# Patient Record
Sex: Female | Born: 1985 | Race: White | Hispanic: No | Marital: Married | Smoking: Never smoker
Health system: Southern US, Community
[De-identification: ages and names within clinical notes are randomized; demographics above are authoritative.]

## PROBLEM LIST (undated history)

## (undated) ENCOUNTER — Inpatient Hospital Stay (HOSPITAL_COMMUNITY): Payer: Self-pay

## (undated) DIAGNOSIS — Z973 Presence of spectacles and contact lenses: Secondary | ICD-10-CM

## (undated) DIAGNOSIS — E039 Hypothyroidism, unspecified: Secondary | ICD-10-CM

## (undated) DIAGNOSIS — M1990 Unspecified osteoarthritis, unspecified site: Secondary | ICD-10-CM

## (undated) DIAGNOSIS — B019 Varicella without complication: Secondary | ICD-10-CM

## (undated) DIAGNOSIS — Z8639 Personal history of other endocrine, nutritional and metabolic disease: Secondary | ICD-10-CM

## (undated) DIAGNOSIS — J189 Pneumonia, unspecified organism: Secondary | ICD-10-CM

## (undated) DIAGNOSIS — F319 Bipolar disorder, unspecified: Secondary | ICD-10-CM

## (undated) DIAGNOSIS — F419 Anxiety disorder, unspecified: Secondary | ICD-10-CM

## (undated) DIAGNOSIS — F329 Major depressive disorder, single episode, unspecified: Secondary | ICD-10-CM

## (undated) HISTORY — DX: Bipolar disorder, unspecified: F31.9

## (undated) HISTORY — DX: Anxiety disorder, unspecified: F41.9

## (undated) HISTORY — PX: TONSILLECTOMY: SUR1361

## (undated) HISTORY — PX: NO PAST SURGERIES: SHX2092

---
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
## 2003-05-04 ENCOUNTER — Other Ambulatory Visit: Admission: RE | Admit: 2003-05-04 | Discharge: 2003-05-04 | Payer: Self-pay | Admitting: Gynecology

## 2004-05-21 ENCOUNTER — Other Ambulatory Visit: Admission: RE | Admit: 2004-05-21 | Discharge: 2004-05-21 | Payer: Self-pay | Admitting: Obstetrics and Gynecology

## 2004-08-21 ENCOUNTER — Inpatient Hospital Stay (HOSPITAL_COMMUNITY): Admission: AD | Admit: 2004-08-21 | Discharge: 2004-08-21 | Payer: Self-pay | Admitting: Obstetrics and Gynecology

## 2004-08-22 ENCOUNTER — Inpatient Hospital Stay (HOSPITAL_COMMUNITY): Admission: AD | Admit: 2004-08-22 | Discharge: 2004-08-22 | Payer: Self-pay | Admitting: Obstetrics and Gynecology

## 2004-08-23 ENCOUNTER — Inpatient Hospital Stay (HOSPITAL_COMMUNITY): Admission: AD | Admit: 2004-08-23 | Discharge: 2004-08-23 | Payer: Self-pay | Admitting: Obstetrics and Gynecology

## 2004-08-24 ENCOUNTER — Inpatient Hospital Stay (HOSPITAL_COMMUNITY): Admission: AD | Admit: 2004-08-24 | Discharge: 2004-08-24 | Payer: Self-pay | Admitting: Obstetrics and Gynecology

## 2004-09-10 ENCOUNTER — Inpatient Hospital Stay (HOSPITAL_COMMUNITY): Admission: AD | Admit: 2004-09-10 | Discharge: 2004-09-10 | Payer: Self-pay | Admitting: Obstetrics and Gynecology

## 2004-09-21 ENCOUNTER — Inpatient Hospital Stay (HOSPITAL_COMMUNITY): Admission: AD | Admit: 2004-09-21 | Discharge: 2004-09-23 | Payer: Self-pay | Admitting: Obstetrics and Gynecology

## 2004-09-21 ENCOUNTER — Encounter (INDEPENDENT_AMBULATORY_CARE_PROVIDER_SITE_OTHER): Payer: Self-pay | Admitting: *Deleted

## 2005-05-07 ENCOUNTER — Other Ambulatory Visit: Admission: RE | Admit: 2005-05-07 | Discharge: 2005-05-07 | Payer: Self-pay | Admitting: Gynecology

## 2006-05-07 ENCOUNTER — Other Ambulatory Visit: Admission: RE | Admit: 2006-05-07 | Discharge: 2006-05-07 | Payer: Self-pay | Admitting: Gynecology

## 2008-03-01 ENCOUNTER — Emergency Department (HOSPITAL_COMMUNITY): Admission: EM | Admit: 2008-03-01 | Discharge: 2008-03-01 | Payer: Self-pay | Admitting: Emergency Medicine

## 2009-06-24 ENCOUNTER — Emergency Department (HOSPITAL_COMMUNITY): Admission: EM | Admit: 2009-06-24 | Discharge: 2009-06-24 | Payer: Self-pay | Admitting: Emergency Medicine

## 2010-02-22 ENCOUNTER — Emergency Department (HOSPITAL_COMMUNITY)
Admission: EM | Admit: 2010-02-22 | Discharge: 2010-02-22 | Payer: Self-pay | Source: Home / Self Care | Admitting: Emergency Medicine

## 2010-04-19 ENCOUNTER — Emergency Department (HOSPITAL_COMMUNITY)
Admission: EM | Admit: 2010-04-19 | Discharge: 2010-04-19 | Payer: Self-pay | Source: Home / Self Care | Admitting: Emergency Medicine

## 2010-06-09 ENCOUNTER — Emergency Department (HOSPITAL_COMMUNITY)
Admission: EM | Admit: 2010-06-09 | Discharge: 2010-06-09 | Payer: Self-pay | Source: Home / Self Care | Admitting: Emergency Medicine

## 2010-07-01 NOTE — L&D Delivery Note (Signed)
Delivery Note At 3:31 AM a viable female was delivered via Vaginal, Spontaneous Delivery (Presentation: Left Occiput Anterior).  APGAR: 8, 9; weight 7 lb 9 oz (3430 g).   Placenta status: Intact, Spontaneous.  Cord: 3 vessels with the following complications: Tight nuchal cord x 2 delivered through.  Anesthesia: Epidural  Episiotomy: None Lacerations: None Suture Repair: N/A Est. Blood Loss (mL): 300cc  Mom to postpartum.  Baby to nursery-stable.  Oliver Pila 04/05/2011, 3:52 AM

## 2010-08-02 DIAGNOSIS — E039 Hypothyroidism, unspecified: Secondary | ICD-10-CM | POA: Insufficient documentation

## 2010-08-30 LAB — RUBELLA ANTIBODY, IGM: Rubella: IMMUNE

## 2010-08-30 LAB — GC/CHLAMYDIA PROBE AMP, GENITAL
Chlamydia: NEGATIVE
Gonorrhea: NEGATIVE

## 2010-08-30 LAB — ABO/RH: RH Type: POSITIVE

## 2010-08-30 LAB — HEPATITIS B SURFACE ANTIGEN: Hepatitis B Surface Ag: NEGATIVE

## 2010-08-30 LAB — RPR: RPR: NONREACTIVE

## 2010-08-30 LAB — ANTIBODY SCREEN: Antibody Screen: NEGATIVE

## 2010-08-30 LAB — HIV ANTIBODY (ROUTINE TESTING W REFLEX): HIV: NONREACTIVE

## 2010-10-01 LAB — URINALYSIS, ROUTINE W REFLEX MICROSCOPIC
Bilirubin Urine: NEGATIVE
Glucose, UA: NEGATIVE mg/dL
Ketones, ur: NEGATIVE mg/dL
Nitrite: POSITIVE — AB
Protein, ur: NEGATIVE mg/dL
Specific Gravity, Urine: 1.019 (ref 1.005–1.030)
Urobilinogen, UA: 1 mg/dL (ref 0.0–1.0)
pH: 8 (ref 5.0–8.0)

## 2010-10-01 LAB — URINE MICROSCOPIC-ADD ON

## 2010-10-01 LAB — POCT PREGNANCY, URINE: Preg Test, Ur: NEGATIVE

## 2010-11-16 NOTE — Discharge Summary (Signed)
Rebecca Holt, Rebecca Holt NO.:  [id number]   MEDICAL RECORD NO.:  [id number]          PATIENT TYPE:  INP   LOCATION:  9101                          FACILITY:  [facility]   PHYSICIAN:  Malachi Pro. Ambrose Mantle, M.D. DATE OF BIRTH:  [birth year]   DATE OF ADMISSION:  09/21/2004  DATE OF DISCHARGE:  09/23/2004                                 DISCHARGE SUMMARY   HISTORY OF PRESENT ILLNESS:  A 25 year old white female, para 0, gravida 1,  at [redacted] weeks gestation, [facility] October 26, 2004, by a 13-week ultrasound presented  to the labor and delivery in active labor with cervical change, 4+ cm, 80%,  bulging bag of water.  Prenatal care complicated by psychiatric history of  bipolar/depressive disorder with some first trimester exposure to Depakote  before it was discontinued.  Also she had preterm labor beginning at 29  weeks with multiple maternity admission unit visits and treatment with bed  rest, terbutaline, on a p.r.n. basis.  She did receive betamethasone at that  time.   PRENATAL LABORATORY DATA:  O positive, negative antibody, rubella immune,  hepatitis B surface antigen negative, HIV declined, GC and Chlamydia  negative, RPR nonreactive, Group B Strep not done at the time of admission.  1-hour Glucola 104.   PAST OBSTETRICAL HISTORY:  Negative.   GYN HISTORY:  Negative.   PAST SURGICAL HISTORY:  Negative.   PAST MEDICAL HISTORY:  History of bipolar/depressive disorder off  medications for pregnancy.   ALLERGIES:  No known drug allergies.   MEDICATIONS:  None other than prenatal vitamins.   PHYSICAL EXAMINATION:  VITAL SIGNS:  Normal.  HEART:  LUNGS:  Normal.  ABDOMEN:  Soft, gravid, and nontender.  PELVIC:  Cervix was 5+ cm with Dr. Senaida Ores examined her, 80%.   HOSPITAL COURSE:  The patient was placed on penicillin for unknown Group B  Strep status.  By 1:36 p.m. the Pitocin was at 6 milliunits per minute,  contractions every 2 to 3 minutes.  The patient had an epidural  and was  comfortable.  The cervix was 6 cm.  Artificial rupture of membranes produced  clear fluid.  She progressed to full dilatation and pushed well.  She  delivered spontaneously OA over a second degree midline laceration by Dr.  Ambrose Mantle, a living female infant 6 pounds 2 ounces, Apgars of 9 at one minute  and 9 at five minutes.  The placenta was intact, uterus normal, rectal  negative.  Second degree midline laceration repaired with 3-0 Vicryl.  A 2-0  Vicryl suture was used for left hymenal tear.  Estimated blood loss about  400 mL.  Postpartum, the patient did quite well.  The baby stayed in the  fullterm nursery.  There was some question about whether the baby's tongue  was attached in a tongue-tied fashion.  Initial hemoglobin 11.4,  hematocrit 34.2, white count 15,700, platelet count 343,000. Follow-up  hemoglobin 9.6, hematocrit 28.0, platelet count 282,000. RPR was  nonreactive.  Group B Strep is pending at the present time.   DISCHARGE DIAGNOSES:  1.  Intrauterine pregnancy at 35 weeks.  2.  Delivered OA.   PROCEDURE:  Spontaneous delivery OA. Repair of second degree midline and  left vaginal tear.   CONDITION ON DISCHARGE:  Improved.   DISCHARGE INSTRUCTIONS:  Includes our regular discharge instruction booklet,  the patient declines analgesics at discharge and is advised to return to the  office in 6 weeks for follow-up examination.  She is also advised to take  iron pills because of her slightly low hemoglobin.      TFH/MEDQ  D:  09/23/2004  T:  09/24/2004  Job:  [id number]

## 2010-12-30 ENCOUNTER — Inpatient Hospital Stay (HOSPITAL_COMMUNITY)
Admission: AD | Admit: 2010-12-30 | Discharge: 2010-12-30 | Disposition: A | Discharge status: Home / Self Care | Payer: Medicaid Other | Source: Ambulatory Visit | Attending: Obstetrics and Gynecology | Admitting: Obstetrics and Gynecology

## 2010-12-30 DIAGNOSIS — O4700 False labor before 37 completed weeks of gestation, unspecified trimester: Secondary | ICD-10-CM | POA: Insufficient documentation

## 2010-12-30 LAB — URINALYSIS, ROUTINE W REFLEX MICROSCOPIC
Bilirubin Urine: NEGATIVE
Glucose, UA: NEGATIVE mg/dL
Hgb urine dipstick: NEGATIVE
Ketones, ur: NEGATIVE mg/dL
Leukocytes, UA: NEGATIVE
Nitrite: NEGATIVE
Protein, ur: NEGATIVE mg/dL
Specific Gravity, Urine: <1.005 — ABNORMAL LOW (ref 1.005–1.030)
Urobilinogen, UA: 0.2 mg/dL (ref 0.0–1.0)
pH: 6 (ref 5.0–8.0)

## 2010-12-30 LAB — FETAL FIBRONECTIN: Fetal Fibronectin: NEGATIVE

## 2011-01-24 ENCOUNTER — Inpatient Hospital Stay (HOSPITAL_COMMUNITY)
Admission: AD | Admit: 2011-01-24 | Discharge: 2011-01-24 | Disposition: A | Discharge status: Home / Self Care | Payer: Medicaid Other | Source: Ambulatory Visit | Attending: Obstetrics and Gynecology | Admitting: Obstetrics and Gynecology

## 2011-01-24 ENCOUNTER — Encounter (HOSPITAL_COMMUNITY): Payer: Self-pay

## 2011-01-24 DIAGNOSIS — R109 Unspecified abdominal pain: Secondary | ICD-10-CM | POA: Insufficient documentation

## 2011-01-24 DIAGNOSIS — O212 Late vomiting of pregnancy: Secondary | ICD-10-CM | POA: Insufficient documentation

## 2011-01-24 DIAGNOSIS — O479 False labor, unspecified: Secondary | ICD-10-CM

## 2011-01-24 DIAGNOSIS — O4700 False labor before 37 completed weeks of gestation, unspecified trimester: Secondary | ICD-10-CM | POA: Insufficient documentation

## 2011-01-24 DIAGNOSIS — O9989 Other specified diseases and conditions complicating pregnancy, childbirth and the puerperium: Secondary | ICD-10-CM | POA: Insufficient documentation

## 2011-01-24 HISTORY — DX: Hypothyroidism, unspecified: E03.9

## 2011-01-24 MED ORDER — NIFEDIPINE 10 MG PO CAPS
20.0000 mg | ORAL_CAPSULE | Freq: Once | ORAL | Status: AC
  Administered 2011-01-24: 20 mg via ORAL
  Filled 2011-01-24: qty 2

## 2011-01-24 MED ORDER — NIFEDIPINE 10 MG PO CAPS: 10.0000 mg | ORAL_CAPSULE | Freq: Four times a day (QID) | ORAL | Status: DC

## 2011-01-24 MED ORDER — ONDANSETRON HCL 4 MG/2ML IJ SOLN
4.0000 mg | Freq: Once | INTRAMUSCULAR | Status: AC
  Administered 2011-01-24: 4 mg via INTRAVENOUS
  Filled 2011-01-24: qty 2

## 2011-01-24 MED ORDER — LACTATED RINGERS IV SOLN
INTRAVENOUS | Status: DC
  Administered 2011-01-24: 17:00:00 via INTRAVENOUS

## 2011-01-24 MED ORDER — LACTATED RINGERS IV SOLN
Freq: Once | INTRAVENOUS | Status: AC
  Administered 2011-01-24: 1000 mL via INTRAVENOUS

## 2011-01-24 NOTE — Progress Notes (Signed)
Pt states she started having contractions at 1150, now every 5-6 minutes. Pt has had nausea and diarrhea today. Feels sore all over. Was seen in the office and sent to MAU for monitoring and IVF.

## 2011-01-24 NOTE — Progress Notes (Signed)
Pt sent over from MD's office with orders.Pt examined in office today cervix closed

## 2011-01-24 NOTE — ED Provider Notes (Signed)
History    Patient is 25 year old white female is a gravida 2 para 1 at 29.3 weeks. She was seen earlier today in the office and found to be contracting. Dr. Ellyn Hack did check her cervix in the office and she was found to be closed. She denies vaginal discharge or bleeding. She reports good fetal movement.  Chief Complaint  Patient presents with  . Contractions   HPI  OB History    Grav Para Term Preterm Abortions TAB SAB Ect Mult Living   2 1 0 1 0 0 0 0 0 1       Past Medical History  Diagnosis Date  . Hypothyroidism     Past Surgical History  Procedure Date  . No past surgeries     Family History  Problem Relation Age of Onset  . Diabetic kidney disease Father   . Hyperlipidemia Father     History  Substance Use Topics  . Smoking status: Former Games developer  . Smokeless tobacco: Not on file  . Alcohol Use:     Allergies: No Known Allergies  Prescriptions prior to admission  Medication Sig Dispense Refill  . levothyroxine (SYNTHROID, LEVOTHROID) 50 MCG tablet Take 50 mcg by mouth daily.        . prenatal vitamin w/FE, FA (PRENATAL 1 + 1) 27-1 MG TABS Take 1 tablet by mouth daily.          Review of Systems  Constitutional: Negative for fever and chills.  Cardiovascular: Negative for chest pain.  Gastrointestinal: Positive for nausea and abdominal pain. Negative for vomiting, diarrhea and constipation.  Genitourinary: Negative for dysuria, urgency, frequency, hematuria and flank pain.  Neurological: Negative for dizziness and headaches.  Psychiatric/Behavioral: Negative for depression and suicidal ideas.   Physical Exam   Blood pressure 122/67, pulse 115, temperature 99.5 F (37.5 C), temperature source Oral, resp. rate 20, height 5\' 7"  (1.702 m), weight 187 lb 12.8 oz (85.186 kg), SpO2 99.00%.  Physical Exam  Constitutional: She is oriented to person, place, and time. She appears well-developed and well-nourished. No distress.  HENT:  Head: Normocephalic and  atraumatic.  Eyes: EOM are normal. Pupils are equal, round, and reactive to light.  GI: She exhibits no distension. There is no tenderness. There is no rebound and no guarding.  Neurological: She is alert and oriented to person, place, and time.  Skin: Skin is warm and dry. She is not diaphoretic.  Psychiatric: She has a normal mood and affect. Her behavior is normal. Judgment and thought content normal.    MAU Course  Procedures  Recheck the patient's cervix reveals cervix to be long and closed. NST is reactive for 29 weeks. Her contractions seemed to have subsided after her Procardia was given.  I did discuss this patient with Dr. Ellyn Hack at length. She will be discharged to home with a prescription for procardia. She has a followup appointment in the office tomorrow. Did discuss whether appropriate diet, activities, risks and precautions. She is aware of signs and was preterm labor. She had a question of problems this time.  Assessment and Plan    Clinton Gallant. Rice III, DrHSc, MPAS, PA-C  01/24/2011, 4:04 PM   Henrietta Hoover, PA 01/24/11 1657  D/w pt BMZ if continued ctx with cervical change.  Carmelina Noun, MD

## 2011-03-02 ENCOUNTER — Inpatient Hospital Stay (HOSPITAL_COMMUNITY)
Admission: AD | Admit: 2011-03-02 | Discharge: 2011-03-02 | Disposition: A | Discharge status: Home / Self Care | Payer: Medicaid Other | Source: Ambulatory Visit | Attending: Obstetrics and Gynecology | Admitting: Obstetrics and Gynecology

## 2011-03-02 ENCOUNTER — Encounter (HOSPITAL_COMMUNITY): Payer: Self-pay

## 2011-03-02 DIAGNOSIS — O479 False labor, unspecified: Secondary | ICD-10-CM

## 2011-03-02 DIAGNOSIS — O4700 False labor before 37 completed weeks of gestation, unspecified trimester: Secondary | ICD-10-CM | POA: Insufficient documentation

## 2011-03-02 HISTORY — DX: Varicella without complication: B01.9

## 2011-03-02 LAB — URINALYSIS, ROUTINE W REFLEX MICROSCOPIC
Bilirubin Urine: NEGATIVE
Glucose, UA: NEGATIVE mg/dL
Hgb urine dipstick: NEGATIVE
Ketones, ur: NEGATIVE mg/dL
Leukocytes, UA: NEGATIVE
Nitrite: NEGATIVE
Protein, ur: NEGATIVE mg/dL
Specific Gravity, Urine: 1.015 (ref 1.005–1.030)
Urobilinogen, UA: 0.2 mg/dL (ref 0.0–1.0)
pH: 6 (ref 5.0–8.0)

## 2011-03-02 NOTE — ED Provider Notes (Signed)
History     Chief Complaint  Patient presents with  . Contractions   HPI  Pt is here with report of contractions that started around 2200 last night.  Denies leaking of fluid or vaginal bleeding;  +fetal movement.    Past Medical History  Diagnosis Date  . Hypothyroidism   . Bipolar 1 disorder   . Syphilis   . Chlamydia   . Gonorrhea   . HPV (human papilloma virus) anogenital infection   . Genital herpes   . Chicken pox     Past Surgical History  Procedure Date  . No past surgeries     Family History  Problem Relation Age of Onset  . Diabetic kidney disease Father   . Hyperlipidemia Father   . Diabetes Father   . Bipolar disorder Mother   . Cancer Paternal Grandfather     History  Substance Use Topics  . Smoking status: Former Games developer  . Smokeless tobacco: Not on file  . Alcohol Use:     Allergies: No Known Allergies  Prescriptions prior to admission  Medication Sig Dispense Refill  . acetaminophen (TYLENOL) 325 MG tablet Take 650 mg by mouth every 6 (six) hours as needed. For pain       . levothyroxine (SYNTHROID, LEVOTHROID) 50 MCG tablet Take 50 mcg by mouth daily.        . prenatal vitamin w/FE, FA (PRENATAL 1 + 1) 27-1 MG TABS Take 1 tablet by mouth daily.        Marland Kitchen NIFEdipine (PROCARDIA) 10 MG capsule Take 1 capsule (10 mg total) by mouth 4 (four) times daily.  40 capsule  1    Review of Systems  Gastrointestinal: Positive for abdominal pain.  All other systems reviewed and are negative.   Physical Exam   Blood pressure 134/64, pulse 84, temperature 99.2 F (37.3 C), temperature source Oral, resp. rate 20, height 5' 6.5" (1.689 m), weight 87.658 kg (193 lb 4 oz).  Physical Exam  Constitutional: She is oriented to person, place, and time. She appears well-developed and well-nourished.  HENT:  Head: Normocephalic.  Neck: Normal range of motion. Neck supple.  Cardiovascular: Normal rate, regular rhythm and normal heart sounds.   Respiratory:  Effort normal and breath sounds normal.  Genitourinary: No bleeding around the vagina. Vaginal discharge (mucusy) found.       1/thick/high  Musculoskeletal: Normal range of motion.  Neurological: She is alert and oriented to person, place, and time. She has normal reflexes.  Skin: Skin is warm and dry.    MAU Course  Procedures Consult with Dr. Senaida Ores  Assessment and Plan  Rebecca Holt  Plan:  DC to home; pt declines terbutaline  Follow-up in office this week.  [facility] 03/02/2011, 1:44 AM

## 2011-03-02 NOTE — Consult Note (Signed)
Consulted with Dr. Senaida Ores regarding HPI/Exam/OB hx>offer terbutaline and DC home.

## 2011-03-02 NOTE — Progress Notes (Signed)
Patient is here with c/o ctx q7m since 10pm. She denies any vaginal bleeding, lof. She reports good fetal movement.

## 2011-03-02 NOTE — Progress Notes (Signed)
Pt states, " I started having contractions at 10pm at 5 min apart. Some have become stronger but they have remained 5 min apart."

## 2011-03-07 LAB — STREP B DNA PROBE: GBS: NEGATIVE

## 2011-03-21 ENCOUNTER — Ambulatory Visit (HOSPITAL_COMMUNITY)
Admission: RE | Admit: 2011-03-21 | Discharge: 2011-03-21 | Disposition: A | Discharge status: Home / Self Care | Payer: Medicaid Other | Source: Ambulatory Visit | Attending: Obstetrics and Gynecology | Admitting: Obstetrics and Gynecology

## 2011-03-21 DIAGNOSIS — M7989 Other specified soft tissue disorders: Secondary | ICD-10-CM

## 2011-03-21 DIAGNOSIS — O99891 Other specified diseases and conditions complicating pregnancy: Secondary | ICD-10-CM | POA: Insufficient documentation

## 2011-03-21 DIAGNOSIS — M79609 Pain in unspecified limb: Secondary | ICD-10-CM | POA: Insufficient documentation

## 2011-04-01 ENCOUNTER — Encounter (HOSPITAL_COMMUNITY): Payer: Self-pay | Admitting: *Deleted

## 2011-04-01 ENCOUNTER — Telehealth (HOSPITAL_COMMUNITY): Payer: Self-pay | Admitting: *Deleted

## 2011-04-01 NOTE — Telephone Encounter (Signed)
Preadmission screen  

## 2011-04-03 ENCOUNTER — Other Ambulatory Visit: Payer: Self-pay | Admitting: Obstetrics and Gynecology

## 2011-04-03 LAB — STREP A DNA PROBE: Group A Strep Probe: NEGATIVE

## 2011-04-03 LAB — RAPID STREP SCREEN (MED CTR MEBANE ONLY): Streptococcus, Group A Screen (Direct): NEGATIVE

## 2011-04-03 LAB — MONONUCLEOSIS SCREEN: Mono Screen: NEGATIVE

## 2011-04-04 ENCOUNTER — Inpatient Hospital Stay (HOSPITAL_COMMUNITY)
Admission: RE | Admit: 2011-04-04 | Discharge: 2011-04-07 | DRG: 775 | Disposition: A | Discharge status: Home / Self Care | Payer: Medicaid Other | Source: Ambulatory Visit | Attending: Obstetrics and Gynecology | Admitting: Obstetrics and Gynecology

## 2011-04-04 DIAGNOSIS — E039 Hypothyroidism, unspecified: Secondary | ICD-10-CM | POA: Diagnosis present

## 2011-04-04 DIAGNOSIS — O99892 Other specified diseases and conditions complicating childbirth: Secondary | ICD-10-CM | POA: Diagnosis present

## 2011-04-04 DIAGNOSIS — E079 Disorder of thyroid, unspecified: Secondary | ICD-10-CM | POA: Diagnosis present

## 2011-04-04 LAB — CBC
HCT: 32.4 % — ABNORMAL LOW (ref 36.0–46.0)
Hemoglobin: 11.1 g/dL — ABNORMAL LOW (ref 12.0–15.0)
MCH: 29 pg (ref 26.0–34.0)
MCHC: 34.3 g/dL (ref 30.0–36.0)
MCV: 84.6 fL (ref 78.0–100.0)
Platelets: 260 10*3/uL (ref 150–400)
RBC: 3.83 MIL/uL — ABNORMAL LOW (ref 3.87–5.11)
RDW: 13.9 % (ref 11.5–15.5)
WBC: 11.7 10*3/uL — ABNORMAL HIGH (ref 4.0–10.5)

## 2011-04-04 MED ORDER — OXYCODONE-ACETAMINOPHEN 5-325 MG PO TABS: 2.0000 | ORAL_TABLET | ORAL | Status: DC | PRN

## 2011-04-04 MED ORDER — EPHEDRINE 5 MG/ML INJ
10.0000 mg | INTRAVENOUS | Status: DC | PRN
  Filled 2011-04-04 (×2): qty 4

## 2011-04-04 MED ORDER — ACETAMINOPHEN 325 MG PO TABS
650.0000 mg | ORAL_TABLET | ORAL | Status: DC | PRN
  Administered 2011-04-04: 650 mg via ORAL
  Filled 2011-04-04: qty 2

## 2011-04-04 MED ORDER — OXYTOCIN BOLUS FROM INFUSION
500.0000 mL | Freq: Once | INTRAVENOUS | Status: DC
  Filled 2011-04-04: qty 500

## 2011-04-04 MED ORDER — DIPHENHYDRAMINE HCL 50 MG/ML IJ SOLN: 12.5000 mg | INTRAMUSCULAR | Status: DC | PRN

## 2011-04-04 MED ORDER — FLEET ENEMA 7-19 GM/118ML RE ENEM: 1.0000 | ENEMA | RECTAL | Status: DC | PRN

## 2011-04-04 MED ORDER — LACTATED RINGERS IV SOLN
INTRAVENOUS | Status: DC
  Administered 2011-04-04: 22:00:00 via INTRAVENOUS

## 2011-04-04 MED ORDER — LIDOCAINE HCL (PF) 1 % IJ SOLN
30.0000 mL | INTRAMUSCULAR | Status: DC | PRN
  Filled 2011-04-04 (×2): qty 30

## 2011-04-04 MED ORDER — PHENYLEPHRINE 40 MCG/ML (10ML) SYRINGE FOR IV PUSH (FOR BLOOD PRESSURE SUPPORT)
80.0000 ug | PREFILLED_SYRINGE | INTRAVENOUS | Status: DC | PRN
  Filled 2011-04-04 (×2): qty 5

## 2011-04-04 MED ORDER — PHENYLEPHRINE 40 MCG/ML (10ML) SYRINGE FOR IV PUSH (FOR BLOOD PRESSURE SUPPORT)
80.0000 ug | PREFILLED_SYRINGE | INTRAVENOUS | Status: DC | PRN
  Filled 2011-04-04: qty 5

## 2011-04-04 MED ORDER — ONDANSETRON HCL 4 MG/2ML IJ SOLN
4.0000 mg | Freq: Four times a day (QID) | INTRAMUSCULAR | Status: DC | PRN
  Filled 2011-04-04: qty 2

## 2011-04-04 MED ORDER — LACTATED RINGERS IV SOLN: 500.0000 mL | Freq: Once | INTRAVENOUS | Status: DC

## 2011-04-04 MED ORDER — CITRIC ACID-SODIUM CITRATE 334-500 MG/5ML PO SOLN: 30.0000 mL | ORAL | Status: DC | PRN

## 2011-04-04 MED ORDER — IBUPROFEN 600 MG PO TABS: 600.0000 mg | ORAL_TABLET | Freq: Four times a day (QID) | ORAL | Status: DC | PRN

## 2011-04-04 MED ORDER — TERBUTALINE SULFATE 1 MG/ML IJ SOLN: 0.2500 mg | Freq: Once | INTRAMUSCULAR | Status: AC | PRN

## 2011-04-04 MED ORDER — FENTANYL 2.5 MCG/ML BUPIVACAINE 1/10 % EPIDURAL INFUSION (WH - ANES)
14.0000 mL/h | INTRAMUSCULAR | Status: DC
  Administered 2011-04-05: 14 mL/h via EPIDURAL
  Filled 2011-04-04: qty 60

## 2011-04-04 MED ORDER — OXYTOCIN 20 UNITS IN LACTATED RINGERS INFUSION - SIMPLE
1.0000 m[IU]/min | INTRAVENOUS | Status: DC
  Administered 2011-04-04: 2 m[IU]/min via INTRAVENOUS
  Filled 2011-04-04: qty 1000

## 2011-04-04 MED ORDER — LACTATED RINGERS IV SOLN: 500.0000 mL | INTRAVENOUS | Status: DC | PRN

## 2011-04-04 MED ORDER — EPHEDRINE 5 MG/ML INJ
10.0000 mg | INTRAVENOUS | Status: DC | PRN
  Filled 2011-04-04: qty 4

## 2011-04-04 MED ORDER — OXYTOCIN 20 UNITS IN LACTATED RINGERS INFUSION - SIMPLE: 125.0000 mL/h | Freq: Once | INTRAVENOUS | Status: DC

## 2011-04-04 NOTE — H&P (Signed)
Rebecca Holt is a 25 y.o. female G2P0101 at 44 3/7 weeks (EDD 04/08/11 by LMP c/w 8 week [place]) presenting for induction of labor given term status and favorable cervix.  Prenatal care significant for a prior PTD at 35 weeks so pt treated with weekly 17-progesterone this pregnancy from 15-35 weeks.  She did have some preterm contractions but never dilated significantly.  No other significant prenatal issues.  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  Fetal US have been WNL. Thyroid levels stable in pregnancy.  Maternal Medical History:  Contractions: Frequency: irregular.    Fetal activity: Perceived fetal activity is normal.      OB History    Grav Para Term Preterm Abortions TAB SAB Ect Mult Living   2 1 0 1 0 0 0 0 0 1     NSVD 2006 6#2oz at 35 weeks  Past Medical History  Diagnosis Date  . Hypothyroidism   . Bipolar 1 disorder   . Chicken pox    Past Surgical History  Procedure Date  . No past surgeries    Family History: family history includes Bipolar disorder in her mother; Cancer in her maternal grandfather and paternal grandfather; Diabetes in her father; Diabetic kidney disease in her father; Hyperlipidemia in her father; Mental illness in her mother; and Thyroid cancer in her maternal grandmother.  There is no history of Anesthesia problems, and Hypotension, and Malignant hyperthermia, and Pseudochol deficiency, . Social History:  reports that she has quit smoking. She does not have any smokeless tobacco history on file. She reports that she does not drink alcohol or use illicit drugs.  ROS negative  Maternal Exam:  Uterine Assessment: Contraction strength is mild.  Contraction frequency is irregular.   Abdomen: Estimated fetal weight is 7 1/2 lbs.   Fetal presentation: vertex  Pelvis: adequate for delivery.      Physical Exam  Constitutional: She appears well-developed.  Cardiovascular: Normal rate and regular rhythm.   Respiratory: Effort normal and  breath sounds normal.  GI: Soft. Bowel sounds are normal.  Genitourinary: Vagina normal and uterus normal.  Neurological: She is alert.  Psychiatric: She has a normal mood and affect. Her behavior is normal.    Prenatal labs: ABO, Rh: O/Positive/-- (03/01 0000) Antibody: Negative (03/01 0000) Rubella: Immune (03/01 0000) RPR: Nonreactive (03/01 0000)  HBsAg: Negative (03/01 0000)  HIV: Non-reactive (03/01 0000)  GBS: Negative (09/06 0000)  One hour glucola 104 GC neg Chlam neg Hgb AA  First trimester screen and AFP WNL  Assessment/Plan: Pt to be admitted for pitocin induction.  Plan AROM after admission.  Is currently holding for  bed availability.   Oliver Pila 04/04/2011, 8:40 AM

## 2011-04-04 NOTE — Progress Notes (Signed)
Rebecca Holt is a 25 y.o. G2P0101 at [redacted]w[redacted]d who is finally making it in to start her induction this PM.  Has been on hold all day given L&D very busy.  Subjective: Pt feels well, no significant ctx.  Has had a cough and URI sx.  Objective: BP 116/86  Pulse 101  Temp(Src) 99 F (37.2 C) (Oral)  Resp 18  Ht 5\' 7"  (1.702 m)  Wt 88.451 kg (195 lb)  BMI 30.54 kg/m2      FHT:  FHR: 140 bpm, variability: moderate,  accelerations:  Present,  decelerations:  Absent UC:   none SVE:   Dilation: 3 Effacement (%): 50 Station: -2 Exam by:: Dr. Senaida Ores AROM clear  Labs: Lab Results  Component Value Date   WBC 11.7* 04/04/2011   HGB 11.1* 04/04/2011   HCT 32.4* 04/04/2011   MCV 84.6 04/04/2011   PLT 260 04/04/2011    Assessment / Plan: Pt to start pitocin and plans epidural.  Will follow progress.  Oliver Pila 04/04/2011, 10:03 PM

## 2011-04-05 ENCOUNTER — Encounter (HOSPITAL_COMMUNITY): Payer: Self-pay

## 2011-04-05 ENCOUNTER — Inpatient Hospital Stay (HOSPITAL_COMMUNITY): Payer: Medicaid Other | Admitting: Anesthesiology

## 2011-04-05 ENCOUNTER — Encounter (HOSPITAL_COMMUNITY): Payer: Self-pay | Admitting: Anesthesiology

## 2011-04-05 LAB — RPR: RPR Ser Ql: NONREACTIVE

## 2011-04-05 MED ORDER — PRENATAL PLUS 27-1 MG PO TABS
1.0000 | ORAL_TABLET | Freq: Every day | ORAL | Status: DC
  Administered 2011-04-05 – 2011-04-07 (×3): 1 via ORAL
  Filled 2011-04-05 (×3): qty 1

## 2011-04-05 MED ORDER — AZITHROMYCIN 500 MG PO TABS
500.0000 mg | ORAL_TABLET | Freq: Every day | ORAL | Status: AC
  Administered 2011-04-05: 500 mg via ORAL
  Filled 2011-04-05: qty 1

## 2011-04-05 MED ORDER — WITCH HAZEL-GLYCERIN EX PADS: 1.0000 "application " | MEDICATED_PAD | CUTANEOUS | Status: DC | PRN

## 2011-04-05 MED ORDER — BENZOCAINE-MENTHOL 20-0.5 % EX AERO: 1.0000 "application " | INHALATION_SPRAY | CUTANEOUS | Status: DC | PRN

## 2011-04-05 MED ORDER — SIMETHICONE 80 MG PO CHEW: 80.0000 mg | CHEWABLE_TABLET | ORAL | Status: DC | PRN

## 2011-04-05 MED ORDER — OXYCODONE-ACETAMINOPHEN 5-325 MG PO TABS: 1.0000 | ORAL_TABLET | ORAL | Status: DC | PRN

## 2011-04-05 MED ORDER — LEVOTHYROXINE SODIUM 50 MCG PO TABS
50.0000 ug | ORAL_TABLET | Freq: Every day | ORAL | Status: DC
  Administered 2011-04-05 – 2011-04-07 (×3): 50 ug via ORAL
  Filled 2011-04-05 (×4): qty 1

## 2011-04-05 MED ORDER — LIDOCAINE HCL 1.5 % IJ SOLN
INTRAMUSCULAR | Status: DC | PRN
  Administered 2011-04-05: 2 mL via INTRADERMAL
  Administered 2011-04-05 (×2): 5 mL via INTRADERMAL

## 2011-04-05 MED ORDER — SENNOSIDES-DOCUSATE SODIUM 8.6-50 MG PO TABS
2.0000 | ORAL_TABLET | Freq: Every day | ORAL | Status: DC
  Administered 2011-04-05 – 2011-04-06 (×2): 2 via ORAL

## 2011-04-05 MED ORDER — LANOLIN HYDROUS EX OINT: TOPICAL_OINTMENT | CUTANEOUS | Status: DC | PRN

## 2011-04-05 MED ORDER — DIPHENHYDRAMINE HCL 25 MG PO CAPS: 25.0000 mg | ORAL_CAPSULE | Freq: Four times a day (QID) | ORAL | Status: DC | PRN

## 2011-04-05 MED ORDER — IBUPROFEN 600 MG PO TABS
600.0000 mg | ORAL_TABLET | Freq: Four times a day (QID) | ORAL | Status: DC
  Administered 2011-04-05 – 2011-04-07 (×9): 600 mg via ORAL
  Filled 2011-04-05 (×9): qty 1

## 2011-04-05 MED ORDER — DIBUCAINE 1 % RE OINT: 1.0000 "application " | TOPICAL_OINTMENT | RECTAL | Status: DC | PRN

## 2011-04-05 MED ORDER — TETANUS-DIPHTH-ACELL PERTUSSIS 5-2.5-18.5 LF-MCG/0.5 IM SUSP
0.5000 mL | Freq: Once | INTRAMUSCULAR | Status: AC
  Administered 2011-04-06: 0.5 mL via INTRAMUSCULAR
  Filled 2011-04-05: qty 0.5

## 2011-04-05 MED ORDER — AZITHROMYCIN 250 MG PO TABS
250.0000 mg | ORAL_TABLET | Freq: Every day | ORAL | Status: DC
  Administered 2011-04-06 – 2011-04-07 (×2): 250 mg via ORAL
  Filled 2011-04-05 (×3): qty 1

## 2011-04-05 MED ORDER — ZOLPIDEM TARTRATE 5 MG PO TABS: 5.0000 mg | ORAL_TABLET | Freq: Every evening | ORAL | Status: DC | PRN

## 2011-04-05 MED ORDER — ONDANSETRON HCL 4 MG PO TABS: 4.0000 mg | ORAL_TABLET | ORAL | Status: DC | PRN

## 2011-04-05 MED ORDER — ONDANSETRON HCL 4 MG/2ML IJ SOLN: 4.0000 mg | INTRAMUSCULAR | Status: DC | PRN

## 2011-04-05 NOTE — Anesthesia Postprocedure Evaluation (Signed)
  Anesthesia Post-op Note  Patient: Rebecca Holt  Procedure(s) Performed: *Lumbar Epidural for L&D *  Patient Location: Mother/Baby  Anesthesia Type: Epidural  Level of Consciousness: awake, alert  and oriented  Airway and Oxygen Therapy: Patient Spontanous Breathing  Post-op Pain: none  Post-op Assessment: Post-op Vital signs reviewed, Patient's Cardiovascular Status Stable, Respiratory Function Stable, Patent Airway, No signs of Nausea or vomiting and Pain level controlled  Post-op Vital Signs: Reviewed and stable  Complications: No apparent anesthesia complications

## 2011-04-05 NOTE — Anesthesia Procedure Notes (Signed)
Epidural Patient location during procedure: OB Start time: 04/05/2011 12:05 AM  Staffing Performed by: anesthesiologist   Preanesthetic Checklist Completed: patient identified, site marked, surgical consent, pre-op evaluation, timeout performed, IV checked, risks and benefits discussed and monitors and equipment checked  Epidural Patient position: sitting Prep: site prepped and draped and DuraPrep Patient monitoring: continuous pulse ox and blood pressure Approach: midline Injection technique: LOR air  Needle:  Needle type: Tuohy  Needle gauge: 17 G Needle length: 9 cm Catheter type: closed end flexible Catheter size: 19 Gauge Test dose: negative  Assessment Events: blood not aspirated, injection not painful, no injection resistance, negative IV test and no paresthesia

## 2011-04-05 NOTE — Addendum Note (Signed)
Addendum  created 04/05/11 1558 by Cephus Shelling   Modules edited:Charges VN, Notes Section

## 2011-04-05 NOTE — Progress Notes (Signed)
Post Partum Day 0 Subjective: voiding well.  Pt still with deep cough, productive of green mucous and feels hot and clammy like a fever just broke.  She had a low grade temp on admission of 99.9 which resolved with tylenol.  Objective: Blood pressure 116/68, pulse 83, temperature 98 F (36.7 C), temperature source Oral, resp. rate 18, height 5\' 7"  (1.702 m), weight 88.451 kg (195 lb), SpO2 98.00%, unknown if currently breastfeeding.  Physical Exam:  General: alert Lochia: appropriate Uterine Fundus: firm  Basename 04/04/11 2120  HGB 11.1*  HCT 32.4*    Assessment/Plan: Pt with low grade temp and deep productive cough.  Will begin Azithromycin for probable URI/bronchitis.  Did not have any risk factors for endometritis.   LOS: 1 day   [place] Every W 04/05/2011, 8:53 AM

## 2011-04-05 NOTE — Addendum Note (Signed)
Addendum  created 04/05/11 1558 by Kaleah Hagemeister   Modules edited:Charges VN, Notes Section    

## 2011-04-05 NOTE — Anesthesia Postprocedure Evaluation (Signed)
  Anesthesia Post-op Note  Patient: Rebecca Holt  Procedure(s) Performed: * No procedures listed *  Patient Location: PACU and Mother/Baby  Anesthesia Type: Epidural  Level of Consciousness: awake, alert  and oriented  Airway and Oxygen Therapy: Patient Spontanous Breathing  Post-op Pain: none  Post-op Assessment: Post-op Vital signs reviewed and Patient's Cardiovascular Status Stable  Post-op Vital Signs: Reviewed and stable  Complications: No apparent anesthesia complications

## 2011-04-05 NOTE — Anesthesia Preprocedure Evaluation (Signed)
Anesthesia Evaluation  Name, MR# and DOB Patient awake  General Assessment Comment  Reviewed: Allergy & Precautions, H&P , NPO status , Patient's Chart, lab work & pertinent test results, reviewed documented beta blocker date and time   History of Anesthesia Complications Negative for: history of anesthetic complications  Airway Mallampati: II TM Distance: >3 FB Neck ROM: full    Dental  (+) Teeth Intact   Pulmonary  clear to auscultation        Cardiovascular regular Normal    Neuro/Psych PSYCHIATRIC DISORDERS (bipolar) Negative Neurological ROS     GI/Hepatic negative GI ROS Neg liver ROS    Endo/Other  hypothyroid  Renal/GU negative Renal ROS     Musculoskeletal   Abdominal   Peds  Hematology negative hematology ROS (+)   Anesthesia Other Findings   Reproductive/Obstetrics (+) Pregnancy                           Anesthesia Physical Anesthesia Plan  ASA: II  Anesthesia Plan: Epidural   Post-op Pain Management:    Induction:   Airway Management Planned:   Additional Equipment:   Intra-op Plan:   Post-operative Plan:   Informed Consent: I have reviewed the patients History and Physical, chart, labs and discussed the procedure including the risks, benefits and alternatives for the proposed anesthesia with the patient or authorized representative who has indicated his/her understanding and acceptance.     Plan Discussed with:   Anesthesia Plan Comments:         Anesthesia Quick Evaluation

## 2011-04-05 NOTE — Progress Notes (Signed)
Rebecca Holt is a 25 y.o. G2P0101 at [redacted]w[redacted]d Subjective: Feels some pain with  Ctx, but overall comfortable with epidural  Objective: BP 135/88  Pulse 87  Temp(Src) 98.3 F (36.8 C) (Oral)  Resp 18  Ht 5\' 7"  (1.702 m)  Wt 88.451 kg (195 lb)  BMI 30.54 kg/m2      FHT:  FHR: 140 bpm, variability: moderate,  accelerations:  Abscent,  decelerations:  Present mild early decels UC:   regular, every 2-3 minutes SVE:   Dilation: 7 Effacement (%): 80 Station: -1 Exam by:: LCarpenter,RN  Exam unchanged on my exam now  Labs: Lab Results  Component Value Date   WBC 11.7* 04/04/2011   HGB 11.1* 04/04/2011   HCT 32.4* 04/04/2011   MCV 84.6 04/04/2011   PLT 260 04/04/2011    Assessment / Plan: Induction of labor due to term and favorable,  progressing well on pitocin  Labor: Progressing normally Rebecca Holt W 04/05/2011, 2:35 AM

## 2011-04-05 NOTE — Progress Notes (Signed)
UR Chart review completed.  

## 2011-04-06 LAB — CBC
HCT: 32.9 % — ABNORMAL LOW (ref 36.0–46.0)
Hemoglobin: 10.9 g/dL — ABNORMAL LOW (ref 12.0–15.0)
MCH: 28.4 pg (ref 26.0–34.0)
MCHC: 33.1 g/dL (ref 30.0–36.0)
MCV: 85.7 fL (ref 78.0–100.0)
Platelets: 236 10*3/uL (ref 150–400)
RBC: 3.84 MIL/uL — ABNORMAL LOW (ref 3.87–5.11)
RDW: 14 % (ref 11.5–15.5)
WBC: 10.4 10*3/uL (ref 4.0–10.5)

## 2011-04-06 LAB — ABO/RH: ABO/RH(D): O POS

## 2011-04-06 NOTE — Progress Notes (Signed)
PPD#1 Doing well, no problems Afeb, VSS Fundus- firm, NT at U-1 Continue routine postpartum care

## 2011-04-06 NOTE — Plan of Care (Signed)
Problem: Discharge Progression Outcomes Goal: MMR given as ordered Outcome: Not Applicable Date Met:  04/06/11 Rubella Immune

## 2011-04-07 MED ORDER — AZITHROMYCIN 250 MG PO TABS: ORAL_TABLET | ORAL | Status: AC

## 2011-04-07 NOTE — Discharge Summary (Signed)
Obstetric Discharge Summary Reason for Admission: induction of labor Prenatal Procedures: weekly 17-P for prior preterm delivery Intrapartum Procedures: spontaneous vaginal delivery Postpartum Procedures: started on Zithromax for URI Complications-Operative and Postpartum: none Hemoglobin  Date Value Range Status  04/06/2011 10.9* 12.0-15.0 (g/dL) Final     HCT  Date Value Range Status  04/06/2011 32.9* 36.0-46.0 (%) Final    Discharge Diagnoses: Term Pregnancy-delivered  Discharge Information: Date: 04/07/2011 Activity: pelvic rest Diet: routine Medications: Ibuprofen and Zithromax for 3 more days Condition: stable Instructions: refer to practice specific booklet Discharge to: home Follow-up Information    Follow up with RICHARDSON,KATHY W. Make an appointment in 6 weeks.   Contact information:   [address] [place] [address] [phone]          Newborn Data: Live born female  Birth Weight: 7 lb 9 oz (3430 g) APGAR: 8, 9  Home with mother.  Rebecca Holt 04/07/2011, 9:52 AM

## 2011-04-07 NOTE — Discharge Summary (Signed)
RN reviewed discharge instructions with patient for her and baby. RN gave her prescription provided by MD. Patient was very eager to leave and had no complaints at time of discharge. Patient was walked out by nurse tech, baby and mother bracelets were matched at discharge. Baby and mom left the campus in a private vehicle, baby was secured in carseat.

## 2012-03-04 IMAGING — CR DG CHEST 2V
2 series · 2 of 2 positions shown · non-contrast
Comparison: None.

CLINICAL DATA: Cough and shortness of breath.

CHEST - 2 VIEW

[w chest pa]
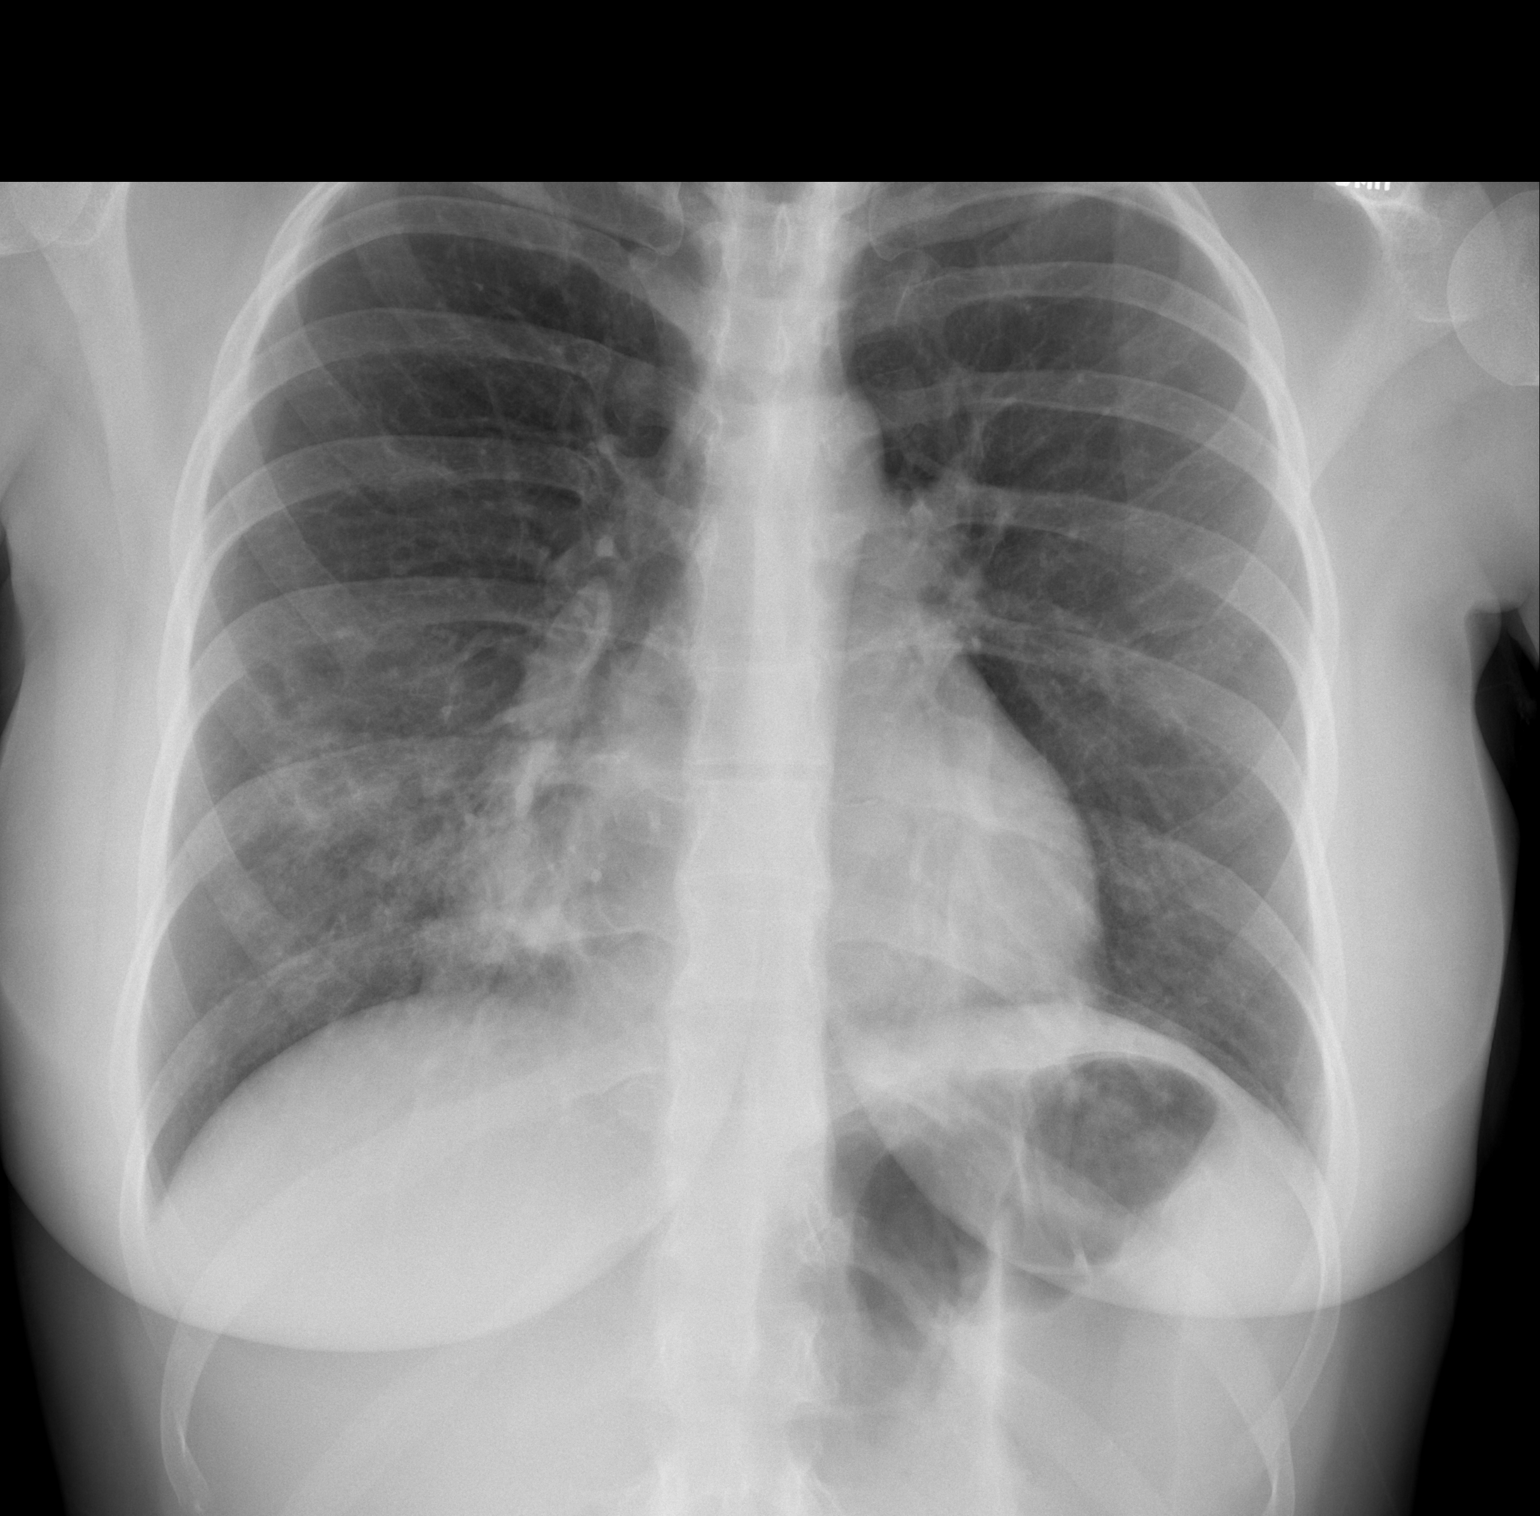

[w chest lat]
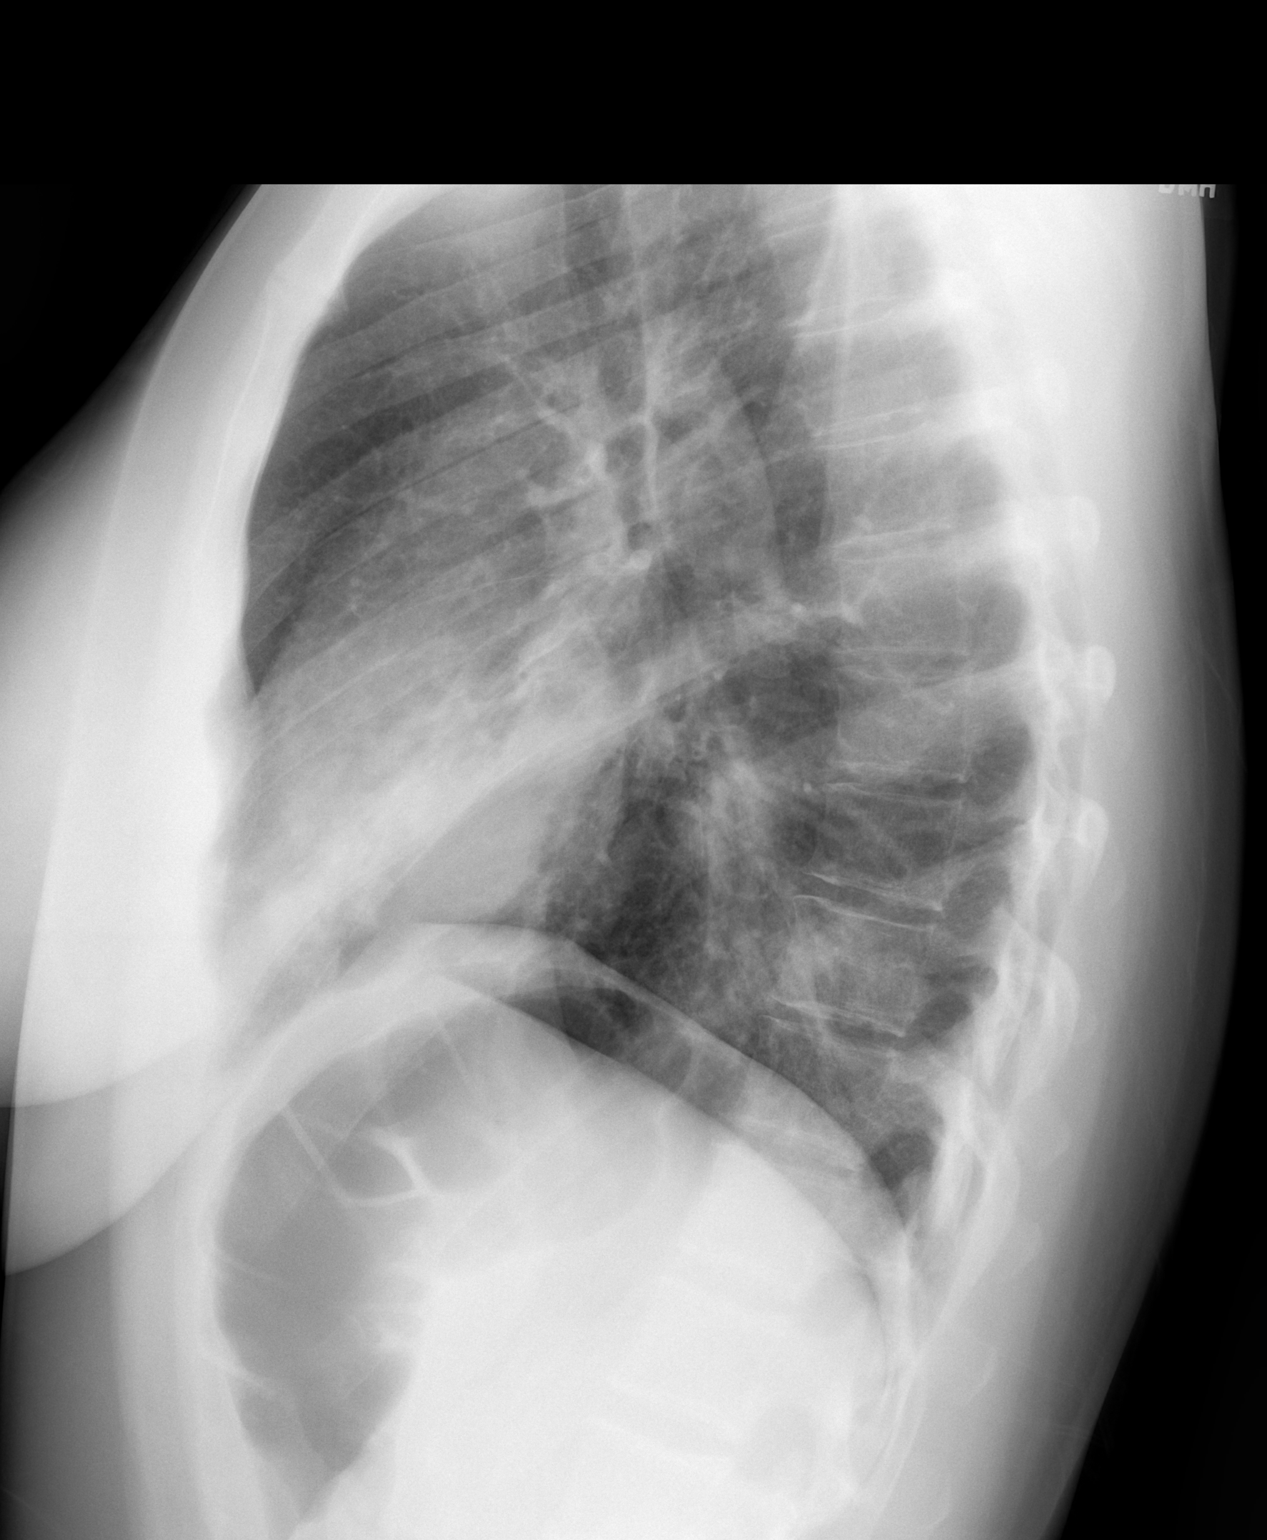

[2 of 2 positions shown; findings below may reference images not displayed]

FINDINGS: Patchy airspace disease is seen in the right middle and
lower lobes, consistent with pneumonia.  Left lung is clear. The
cardiopericardial silhouette is within normal limits for size.
Imaged bony structures of the thorax are intact.
IMPRESSION: Airspace disease in the right middle and lower lobes, consistent
with pneumonia.  Follow-up x-ray is recommended to ensure complete
resolution.

## 2012-12-30 DIAGNOSIS — L729 Follicular cyst of the skin and subcutaneous tissue, unspecified: Secondary | ICD-10-CM | POA: Insufficient documentation

## 2014-05-02 ENCOUNTER — Encounter (HOSPITAL_COMMUNITY): Payer: Self-pay

## 2014-07-01 NOTE — L&D Delivery Note (Signed)
Delivery Note At 8:33 PM a viable female was delivered via Vaginal, Spontaneous Delivery (Presentation:ROA  ).  APGAR: 7, 9; weight  .   Placenta status: Intact, Spontaneous.  Cord:  with the following complications: None .    Anesthesia: None  Episiotomy: None Lacerations: 1st degree Suture Repair: not requiring repair.  Est. Blood Loss (mL):  200  Called to delivery precipitously in the MAU. Dr. Kennon Rounds was called in addition to myself. I arrived slightly earlier, and infant delivered shortly before Dr. Kennon Rounds was able to arrive. With two pushes, the infant delivered. Infant delivered to maternal abdomen. Cord clamped and cut. Active management of 3rd stage with traction and IM Pitocin. Placenta delivered intact with 3v cord. Small first degree tear, not requiring repair. EBL200. Counts correct. Hemostatic.   Aquilla Hacker, MD Family Medicine - PGY 2   Mom to postpartum.  Baby to Couplet care / Skin to Skin.  Tsuyako Jolley 05/26/2015, 9:07 PM

## 2014-10-25 ENCOUNTER — Inpatient Hospital Stay (HOSPITAL_COMMUNITY)
Admission: AD | Admit: 2014-10-25 | Discharge: 2014-10-25 | Disposition: A | Discharge status: Home / Self Care | Payer: 59 | Source: Ambulatory Visit | Attending: Obstetrics and Gynecology | Admitting: Obstetrics and Gynecology

## 2014-10-25 ENCOUNTER — Encounter (HOSPITAL_COMMUNITY): Payer: Self-pay | Admitting: *Deleted

## 2014-10-25 DIAGNOSIS — O99611 Diseases of the digestive system complicating pregnancy, first trimester: Secondary | ICD-10-CM | POA: Diagnosis not present

## 2014-10-25 DIAGNOSIS — A084 Viral intestinal infection, unspecified: Secondary | ICD-10-CM | POA: Diagnosis not present

## 2014-10-25 DIAGNOSIS — Z3A09 9 weeks gestation of pregnancy: Secondary | ICD-10-CM | POA: Insufficient documentation

## 2014-10-25 DIAGNOSIS — O211 Hyperemesis gravidarum with metabolic disturbance: Secondary | ICD-10-CM | POA: Diagnosis not present

## 2014-10-25 DIAGNOSIS — O219 Vomiting of pregnancy, unspecified: Secondary | ICD-10-CM

## 2014-10-25 DIAGNOSIS — E860 Dehydration: Secondary | ICD-10-CM

## 2014-10-25 DIAGNOSIS — O210 Mild hyperemesis gravidarum: Secondary | ICD-10-CM | POA: Diagnosis present

## 2014-10-25 LAB — URINALYSIS, ROUTINE W REFLEX MICROSCOPIC
Bilirubin Urine: NEGATIVE
Glucose, UA: NEGATIVE mg/dL
Hgb urine dipstick: NEGATIVE
Ketones, ur: NEGATIVE mg/dL
Leukocytes, UA: NEGATIVE
Nitrite: NEGATIVE
Protein, ur: NEGATIVE mg/dL
Specific Gravity, Urine: >1.03 — ABNORMAL HIGH (ref 1.005–1.030)
Urobilinogen, UA: 1 mg/dL (ref 0.0–1.0)
pH: 6 (ref 5.0–8.0)

## 2014-10-25 MED ORDER — LACTATED RINGERS IV BOLUS (SEPSIS)
1000.0000 mL | Freq: Once | INTRAVENOUS | Status: AC
  Administered 2014-10-25: 1000 mL via INTRAVENOUS

## 2014-10-25 MED ORDER — PROMETHAZINE HCL 25 MG PO TABS: 12.5000 mg | ORAL_TABLET | Freq: Four times a day (QID) | ORAL | Status: DC | PRN

## 2014-10-25 NOTE — MAU Provider Note (Signed)
Chief Complaint: Emesis During Pregnancy and Diarrhea   First Provider Initiated Contact with Patient 10/25/14 1353      SUBJECTIVE HPI: Rebecca Holt is a 29 y.o. [id number] at [redacted]w[redacted]d by LMP who presents to maternity admissions reporting onset of n/v this morning, followed by diarrhea later in the day.  Her child is also sick, starting with vomiting early this morning.  She has not kept down any food or fluids today and is worried about the affects of this on the pregnancy.  She denies vaginal bleeding, vaginal itching/burning, urinary symptoms, h/a, dizziness, or fever/chills.     Diarrhea  This is a new problem. The current episode started today. The problem occurs less than 2 times per day. The problem has been unchanged. The stool consistency is described as watery. The patient states that diarrhea does not awaken her from sleep. Associated symptoms include vomiting. Pertinent negatives include no abdominal pain, chills, coughing, fever or headaches. Risk factors include ill contacts. She has tried nothing for the symptoms.  Emesis  This is a new problem. The current episode started today. The problem occurs 2 to 4 times per day. The problem has been unchanged. The emesis has an appearance of stomach contents. There has been no fever. Associated symptoms include diarrhea. Pertinent negatives include no abdominal pain, chills, coughing, dizziness, fever or headaches. Risk factors include ill contacts. She has tried nothing for the symptoms.    Past Medical History  Diagnosis Date  . Hypothyroidism   . Chicken pox    Past Surgical History  Procedure Laterality Date  . No past surgeries     History   Social History  . Marital Status: Married    Spouse Name: N/A  . Number of Children: N/A  . Years of Education: N/A   Occupational History  . Not on file.   Social History Main Topics  . Smoking status: Never Smoker   . Smokeless tobacco: Never Used  . Alcohol Use: No  . Drug Use: No   . Sexual Activity: Yes    Birth Control/ Protection: None     Comment: tubal after    Other Topics Concern  . Not on file   Social History Narrative   No current facility-administered medications on file prior to encounter.   Current Outpatient Prescriptions on File Prior to Encounter  Medication Sig Dispense Refill  . levothyroxine (SYNTHROID, LEVOTHROID) 50 MCG tablet Take 50 mcg by mouth daily.      . prenatal vitamin w/FE, FA (PRENATAL 1 + 1) 27-1 MG TABS Take 1 tablet by mouth daily.       No Known Allergies  Review of Systems  Constitutional: Positive for malaise/fatigue. Negative for fever and chills.  Respiratory: Negative for cough and shortness of breath.   Gastrointestinal: Positive for nausea, vomiting and diarrhea. Negative for abdominal pain.  Genitourinary: Negative for dysuria, urgency and frequency.  Neurological: Negative for dizziness and headaches.    OBJECTIVE Blood pressure 109/70, pulse 88, temperature 98.9 F (37.2 C), temperature source Oral, resp. rate 18, height 5\' 7"  (1.702 m), weight 88.678 kg (195 lb 8 oz), SpO2 100 %, unknown if currently breastfeeding. GENERAL: Well-developed, well-nourished female in no acute distress.  EYES: normal sclera/conjunctiva; no lid-lag HENT: Atraumatic, normocephalic HEART: normal rate RESP: normal effort ABDOMEN: Soft, non-tender MUSCULOSKELETAL: Normal ROM EXTREMITIES: Nontender, no edema NEURO/PSYCH: Alert and oriented, appropriate affect    LAB RESULTS Results for orders placed or performed during the hospital encounter of 10/25/14 (from  the past 24 hour(s))  Urinalysis, Routine w reflex microscopic     Status: Abnormal   Collection Time: 10/25/14 12:33 PM  Result Value Ref Range   Color, Urine YELLOW YELLOW   APPearance CLEAR CLEAR   Specific Gravity, Urine >1.030 (H) 1.005 - 1.030   pH 6.0 5.0 - 8.0   Glucose, UA NEGATIVE NEGATIVE mg/dL   Hgb urine dipstick NEGATIVE NEGATIVE   Bilirubin Urine  NEGATIVE NEGATIVE   Ketones, ur NEGATIVE NEGATIVE mg/dL   Protein, ur NEGATIVE NEGATIVE mg/dL   Urobilinogen, UA 1.0 0.0 - 1.0 mg/dL   Nitrite NEGATIVE NEGATIVE   Leukocytes, UA NEGATIVE NEGATIVE    IMAGING Bedside sono by Manya Silvas, CNM, with FHR 164, CRL consistent with LMP dating  MAU MDM LR x 1000 ml  In MAU.  Pt not actively vomiting and no episodes of diarrhea while in MAU.  Pt reported improvement in symptoms after fluids.  Pt driving, so no nausea meds given in MAU.   ASSESSMENT 1. Viral gastroenteritis   2. Mild dehydration   3. Nausea and vomiting during pregnancy prior to [redacted] weeks gestation     PLAN Consult Dr Sandford Craze Discharge home Phenergan 12.5-25 mg PO Q 6 hours PRN Increase PO fluids as tolerated   Follow-up Information    Follow up with Janyth Contes, MD.   Specialty:  Obstetrics and Gynecology   Why:  As scheduled   Contact information:   [address] [address] [phone]       Follow up with [facility].   Why:  As needed for emergencies   Contact information:   [address] [id number] [place] [employer] [place] Certified Nurse-Midwife 10/25/2014  8:43 PM

## 2014-10-25 NOTE — MAU Note (Addendum)
Pt started having vomiting & diarrhea @ 2300 last night, child is also sick.  Has had 2 diarrhea stools.  Denies pain. Has been checking temp, no fever.

## 2014-10-25 NOTE — Discharge Instructions (Signed)

## 2014-11-02 LAB — OB RESULTS CONSOLE HIV ANTIBODY (ROUTINE TESTING): HIV: NONREACTIVE

## 2014-11-02 LAB — OB RESULTS CONSOLE HEPATITIS B SURFACE ANTIGEN: Hepatitis B Surface Ag: NEGATIVE

## 2014-11-02 LAB — OB RESULTS CONSOLE ANTIBODY SCREEN: Antibody Screen: NEGATIVE

## 2014-11-02 LAB — OB RESULTS CONSOLE RPR: RPR: NONREACTIVE

## 2014-11-02 LAB — OB RESULTS CONSOLE ABO/RH: RH Type: POSITIVE

## 2014-11-02 LAB — OB RESULTS CONSOLE GC/CHLAMYDIA
Chlamydia: NEGATIVE
Gonorrhea: NEGATIVE

## 2014-11-02 LAB — OB RESULTS CONSOLE RUBELLA ANTIBODY, IGM: Rubella: IMMUNE

## 2015-04-26 LAB — OB RESULTS CONSOLE GBS: GBS: NEGATIVE

## 2015-05-17 ENCOUNTER — Inpatient Hospital Stay (HOSPITAL_COMMUNITY)
Admission: AD | Admit: 2015-05-17 | Discharge: 2015-05-18 | Disposition: A | Discharge status: Home / Self Care | Payer: 59 | Source: Ambulatory Visit | Attending: Obstetrics and Gynecology | Admitting: Obstetrics and Gynecology

## 2015-05-17 ENCOUNTER — Inpatient Hospital Stay (HOSPITAL_COMMUNITY): Payer: 59

## 2015-05-17 ENCOUNTER — Encounter (HOSPITAL_COMMUNITY): Payer: Self-pay | Admitting: *Deleted

## 2015-05-17 DIAGNOSIS — Z3A38 38 weeks gestation of pregnancy: Secondary | ICD-10-CM | POA: Insufficient documentation

## 2015-05-17 DIAGNOSIS — O26893 Other specified pregnancy related conditions, third trimester: Secondary | ICD-10-CM | POA: Diagnosis not present

## 2015-05-17 DIAGNOSIS — E039 Hypothyroidism, unspecified: Secondary | ICD-10-CM | POA: Diagnosis not present

## 2015-05-17 DIAGNOSIS — O288 Other abnormal findings on antenatal screening of mother: Secondary | ICD-10-CM

## 2015-05-17 DIAGNOSIS — R51 Headache: Secondary | ICD-10-CM | POA: Diagnosis not present

## 2015-05-17 DIAGNOSIS — O99283 Endocrine, nutritional and metabolic diseases complicating pregnancy, third trimester: Secondary | ICD-10-CM | POA: Diagnosis not present

## 2015-05-17 LAB — URINALYSIS, ROUTINE W REFLEX MICROSCOPIC
Bilirubin Urine: NEGATIVE
Glucose, UA: NEGATIVE mg/dL
Ketones, ur: 15 mg/dL — AB
Leukocytes, UA: NEGATIVE
Nitrite: NEGATIVE
Protein, ur: NEGATIVE mg/dL
Specific Gravity, Urine: 1.015 (ref 1.005–1.030)
pH: 7.5 (ref 5.0–8.0)

## 2015-05-17 LAB — COMPREHENSIVE METABOLIC PANEL
ALT: 37 U/L (ref 14–54)
AST: 36 U/L (ref 15–41)
Albumin: 3.5 g/dL (ref 3.5–5.0)
Alkaline Phosphatase: 106 U/L (ref 38–126)
Anion gap: 9 (ref 5–15)
BUN: 8 mg/dL (ref 6–20)
CO2: 22 mmol/L (ref 22–32)
Calcium: 9.4 mg/dL (ref 8.9–10.3)
Chloride: 102 mmol/L (ref 101–111)
Creatinine, Ser: 0.68 mg/dL (ref 0.44–1.00)
GFR calc Af Amer: >60 mL/min (ref >60)
GFR calc non Af Amer: >60 mL/min (ref >60)
Glucose, Bld: 139 mg/dL — ABNORMAL HIGH (ref 65–99)
Potassium: 3.4 mmol/L — ABNORMAL LOW (ref 3.5–5.1)
Sodium: 133 mmol/L — ABNORMAL LOW (ref 135–145)
Total Bilirubin: 0.3 mg/dL (ref 0.3–1.2)
Total Protein: 7.1 g/dL (ref 6.5–8.1)

## 2015-05-17 LAB — CBC
HCT: 36.5 % (ref 36.0–46.0)
Hemoglobin: 11.9 g/dL — ABNORMAL LOW (ref 12.0–15.0)
MCH: 27.4 pg (ref 26.0–34.0)
MCHC: 32.6 g/dL (ref 30.0–36.0)
MCV: 84.1 fL (ref 78.0–100.0)
Platelets: 261 10*3/uL (ref 150–400)
RBC: 4.34 MIL/uL (ref 3.87–5.11)
RDW: 14.8 % (ref 11.5–15.5)
WBC: 9.1 10*3/uL (ref 4.0–10.5)

## 2015-05-17 LAB — URINE MICROSCOPIC-ADD ON: WBC, UA: NONE SEEN WBC/hpf (ref 0–5)

## 2015-05-17 LAB — PROTEIN / CREATININE RATIO, URINE
Creatinine, Urine: 127 mg/dL
Protein Creatinine Ratio: 0.06 mg/mg{Cre} (ref 0.00–0.15)
Total Protein, Urine: 8 mg/dL

## 2015-05-17 MED ORDER — BUTALBITAL-APAP-CAFFEINE 50-325-40 MG PO TABS
2.0000 | ORAL_TABLET | Freq: Once | ORAL | Status: AC
  Administered 2015-05-17: 2 via ORAL
  Filled 2015-05-17: qty 2

## 2015-05-17 NOTE — MAU Note (Signed)
Pt reports she has had a headache since 12 noon today and was told to come in to get her B/P checked per provider.

## 2015-05-17 NOTE — MAU Provider Note (Signed)
History     CSN: [id number]  Arrival date and time: 05/17/15 2044   First Provider Initiated Contact with Patient 05/17/15 2127      No chief complaint on file.  HPI Comments: Rebecca Holt is a 29 y.o. [id number] at [redacted]w[redacted]d who presents today with a headache. She denies any blood pressure problems with this pregnancy. She denies any abdominal pain, contractions, VB of LOF. She states that the fetus has been moving normally. She has an appointment in the morning for a routine visit.   Headache  This is a new problem. The current episode started today (around 1200 ). The problem occurs constantly. The problem has been unchanged. The pain is located in the bilateral and frontal region. The pain quality is similar to prior headaches. The quality of the pain is described as aching. The pain is at a severity of 7/10. Associated symptoms include nausea. Pertinent negatives include no abdominal pain, blurred vision, dizziness, fever or vomiting. The symptoms are aggravated by bright light. She has tried acetaminophen for the symptoms. The treatment provided no relief.    Past Medical History  Diagnosis Date  . Hypothyroidism   . Chicken pox     Past Surgical History  Procedure Laterality Date  . No past surgeries      Family History  Problem Relation Age of Onset  . Diabetic kidney disease Father   . Hyperlipidemia Father   . Diabetes Father   . Bipolar disorder Mother   . Mental illness Mother     bipolar  . Cancer Paternal Grandfather     colon  . Thyroid cancer Maternal Grandmother   . Cancer Maternal Grandfather     leukemia  . Anesthesia problems Neg Hx   . Hypotension Neg Hx   . Malignant hyperthermia Neg Hx   . Pseudochol deficiency Neg Hx     Social History  Substance Use Topics  . Smoking status: Never Smoker   . Smokeless tobacco: Never Used  . Alcohol Use: No    Allergies: No Known Allergies  Prescriptions prior to admission  Medication Sig Dispense Refill Last  Dose  . levothyroxine (SYNTHROID, LEVOTHROID) 50 MCG tablet Take 50 mcg by mouth daily.     05/17/2015 at Unknown time  . prenatal vitamin w/FE, FA (PRENATAL 1 + 1) 27-1 MG TABS Take 1 tablet by mouth daily.     05/16/2015 at Unknown time  . Progesterone 50 MG SUPP Place 50 mg vaginally 2 (two) times daily.   10/25/2014 at Unknown time  . promethazine (PHENERGAN) 25 MG tablet Take 0.5-1 tablets (12.5-25 mg total) by mouth every 6 (six) hours as needed for nausea. 30 tablet 0     Review of Systems  Constitutional: Negative for fever.  Eyes: Negative for blurred vision and double vision.  Gastrointestinal: Positive for nausea. Negative for vomiting, abdominal pain, diarrhea and constipation.  Neurological: Positive for headaches. Negative for dizziness.   Physical Exam   Blood pressure 130/82, pulse 73, temperature 98.3 F (36.8 C), temperature source Oral, resp. rate 16, height 5\' 7"  (1.702 m), weight 98.884 kg (218 lb), SpO2 100 %, unknown if currently breastfeeding.  Physical Exam  Nursing note and vitals reviewed. Constitutional: She is oriented to person, place, and time. She appears well-developed and well-nourished. No distress.  HENT:  Head: Normocephalic.  Eyes: Pupils are equal, round, and reactive to light.  Cardiovascular: Normal rate.   Respiratory: Effort normal.  GI: Soft. There is no tenderness. There is  no rebound.  Neurological: She is alert and oriented to person, place, and time. She has normal reflexes.  Skin: Skin is warm and dry.  Psychiatric: She has a normal mood and affect.    FHT 145, moderate with 15x15 accels, no decels Toco: no UCs  MAU Course  Procedures  MDM  FHT: shows variable decelerations with contractions, and one decel down to about 80 for about 1 min.  Toco: q 63min ctx.   2306: D/W Dr. Willis Modena. He will review the tracing.  2314: D/W Dr. Willis Modena, will get BPP if normal then patient can be DC home.   BPP:6/8 AFI: 10.09  27%tile  0024: D/W Dr. Willis Modena, ok for dc home.   Assessment and Plan   1. Headache in pregnancy, antepartum, third trimester   2. Non-reactive NST (non-stress test)    DC home Comfort measures reviewed  3rd Trimester precautions  Labor precautions  Fetal kick counts RX: none  Return to MAU as needed FU with OB as planned  Follow-up Information    Follow up with [name], MD.   Specialty:  Obstetrics and Gynecology   Why:  As scheduled for in the morning    Contact information:   [address] [place] [address] [phone]         Mathis Bud 05/17/2015, 9:29 PM

## 2015-05-18 ENCOUNTER — Encounter (HOSPITAL_COMMUNITY): Payer: Self-pay | Admitting: *Deleted

## 2015-05-18 ENCOUNTER — Telehealth (HOSPITAL_COMMUNITY): Payer: Self-pay | Admitting: *Deleted

## 2015-05-18 DIAGNOSIS — O26893 Other specified pregnancy related conditions, third trimester: Secondary | ICD-10-CM

## 2015-05-18 DIAGNOSIS — R51 Headache: Secondary | ICD-10-CM | POA: Diagnosis not present

## 2015-05-18 NOTE — Telephone Encounter (Signed)
Preadmission screen  

## 2015-05-18 NOTE — Discharge Instructions (Signed)

## 2015-05-26 ENCOUNTER — Encounter (HOSPITAL_COMMUNITY): Payer: Self-pay

## 2015-05-26 ENCOUNTER — Inpatient Hospital Stay (HOSPITAL_COMMUNITY)
Admission: AD | Admit: 2015-05-26 | Discharge: 2015-05-28 | DRG: 775 | Disposition: A | Discharge status: Home / Self Care | Payer: 59 | Source: Ambulatory Visit | Attending: Obstetrics and Gynecology | Admitting: Obstetrics and Gynecology

## 2015-05-26 DIAGNOSIS — O26893 Other specified pregnancy related conditions, third trimester: Secondary | ICD-10-CM | POA: Diagnosis present

## 2015-05-26 DIAGNOSIS — Z833 Family history of diabetes mellitus: Secondary | ICD-10-CM | POA: Diagnosis not present

## 2015-05-26 DIAGNOSIS — Z3A39 39 weeks gestation of pregnancy: Secondary | ICD-10-CM

## 2015-05-26 DIAGNOSIS — E039 Hypothyroidism, unspecified: Secondary | ICD-10-CM | POA: Diagnosis present

## 2015-05-26 DIAGNOSIS — O99284 Endocrine, nutritional and metabolic diseases complicating childbirth: Secondary | ICD-10-CM | POA: Diagnosis present

## 2015-05-26 MED ORDER — SIMETHICONE 80 MG PO CHEW: 80.0000 mg | CHEWABLE_TABLET | ORAL | Status: DC | PRN

## 2015-05-26 MED ORDER — IBUPROFEN 600 MG PO TABS
600.0000 mg | ORAL_TABLET | Freq: Four times a day (QID) | ORAL | Status: DC
Start: 2015-05-26 — End: 2015-05-28
  Administered 2015-05-27 – 2015-05-28 (×6): 600 mg via ORAL
  Filled 2015-05-26 (×6): qty 1

## 2015-05-26 MED ORDER — BENZOCAINE-MENTHOL 20-0.5 % EX AERO
1.0000 "application " | INHALATION_SPRAY | CUTANEOUS | Status: DC | PRN
  Administered 2015-05-26: 1 via TOPICAL
  Filled 2015-05-26: qty 56

## 2015-05-26 MED ORDER — TETANUS-DIPHTH-ACELL PERTUSSIS 5-2.5-18.5 LF-MCG/0.5 IM SUSP: 0.5000 mL | Freq: Once | INTRAMUSCULAR | Status: DC

## 2015-05-26 MED ORDER — SENNOSIDES-DOCUSATE SODIUM 8.6-50 MG PO TABS
2.0000 | ORAL_TABLET | ORAL | Status: DC
  Administered 2015-05-27: 2 via ORAL
  Filled 2015-05-26: qty 2

## 2015-05-26 MED ORDER — OXYCODONE-ACETAMINOPHEN 5-325 MG PO TABS: 1.0000 | ORAL_TABLET | ORAL | Status: DC | PRN

## 2015-05-26 MED ORDER — OXYTOCIN 10 UNIT/ML IJ SOLN
20.0000 [IU] | Freq: Once | INTRAMUSCULAR | Status: AC
  Administered 2015-05-26: 20 [IU] via INTRAMUSCULAR

## 2015-05-26 MED ORDER — ONDANSETRON HCL 4 MG PO TABS: 4.0000 mg | ORAL_TABLET | ORAL | Status: DC | PRN

## 2015-05-26 MED ORDER — LANOLIN HYDROUS EX OINT: TOPICAL_OINTMENT | CUTANEOUS | Status: DC | PRN

## 2015-05-26 MED ORDER — MEASLES, MUMPS & RUBELLA VAC ~~LOC~~ INJ
0.5000 mL | INJECTION | Freq: Once | SUBCUTANEOUS | Status: DC
  Filled 2015-05-26: qty 0.5

## 2015-05-26 MED ORDER — OXYTOCIN 10 UNIT/ML IJ SOLN
INTRAMUSCULAR | Status: AC
  Administered 2015-05-26: 20 [IU] via INTRAMUSCULAR
  Filled 2015-05-26: qty 1

## 2015-05-26 MED ORDER — ZOLPIDEM TARTRATE 5 MG PO TABS: 5.0000 mg | ORAL_TABLET | Freq: Every evening | ORAL | Status: DC | PRN

## 2015-05-26 MED ORDER — METHYLERGONOVINE MALEATE 0.2 MG/ML IJ SOLN: 0.2000 mg | INTRAMUSCULAR | Status: DC | PRN

## 2015-05-26 MED ORDER — DIBUCAINE 1 % RE OINT: 1.0000 "application " | TOPICAL_OINTMENT | RECTAL | Status: DC | PRN

## 2015-05-26 MED ORDER — MAGNESIUM HYDROXIDE 400 MG/5ML PO SUSP: 30.0000 mL | ORAL | Status: DC | PRN

## 2015-05-26 MED ORDER — WITCH HAZEL-GLYCERIN EX PADS: 1.0000 "application " | MEDICATED_PAD | CUTANEOUS | Status: DC | PRN

## 2015-05-26 MED ORDER — ACETAMINOPHEN 325 MG PO TABS: 650.0000 mg | ORAL_TABLET | ORAL | Status: DC | PRN

## 2015-05-26 MED ORDER — OXYTOCIN 10 UNIT/ML IJ SOLN
INTRAMUSCULAR | Status: AC
  Filled 2015-05-26: qty 1

## 2015-05-26 MED ORDER — PRENATAL MULTIVITAMIN CH
1.0000 | ORAL_TABLET | Freq: Every day | ORAL | Status: DC
Start: 2015-05-27 — End: 2015-05-28
  Administered 2015-05-27: 1 via ORAL
  Filled 2015-05-26: qty 1

## 2015-05-26 MED ORDER — DIPHENHYDRAMINE HCL 25 MG PO CAPS: 25.0000 mg | ORAL_CAPSULE | Freq: Four times a day (QID) | ORAL | Status: DC | PRN

## 2015-05-26 MED ORDER — LEVOTHYROXINE SODIUM 50 MCG PO TABS
50.0000 ug | ORAL_TABLET | Freq: Every day | ORAL | Status: DC
  Administered 2015-05-27 – 2015-05-28 (×2): 50 ug via ORAL
  Filled 2015-05-26 (×2): qty 1

## 2015-05-26 MED ORDER — METHYLERGONOVINE MALEATE 0.2 MG PO TABS: 0.2000 mg | ORAL_TABLET | ORAL | Status: DC | PRN

## 2015-05-26 MED ORDER — ONDANSETRON HCL 4 MG/2ML IJ SOLN: 4.0000 mg | INTRAMUSCULAR | Status: DC | PRN

## 2015-05-26 NOTE — H&P (Signed)
Rebecca Holt is a 29 y.o. female, G4 P1102, EGA 39+ weeks with [facility] 11-28 presenting for ctx.  She had been having ctx every 10-15 minutes, then every 7 minutes and came to the hospital.  On arrival cervix was 9+ cm dilated.  She delivered in MAU with [facility] while I was on my way in.  Prenatal care complicated by hypothyroidism, also received weekly 17-P for h/o PTD.  Maternal Medical History:  Reason for admission: Contractions.   Contractions: Frequency: regular.   Perceived severity is strong.    Fetal activity: Perceived fetal activity is normal.    Prenatal complications: no prenatal complications   OB History    Gravida Para Term Preterm AB TAB SAB Ectopic Multiple Living   4 2 1 1 1  0 1 0 0 2     Past Medical History  Diagnosis Date  . Hypothyroidism   . Chicken pox    Past Surgical History  Procedure Laterality Date  . No past surgeries     Family History: family history includes Bipolar disorder in her mother; Cancer in her maternal grandfather and paternal grandfather; Diabetes in her father; Diabetic kidney disease in her father; Hyperlipidemia in her father; Mental illness in her mother; Thyroid cancer in her maternal grandmother. There is no history of Anesthesia problems, Hypotension, Malignant hyperthermia, or Pseudochol deficiency. Social History:  reports that she has never smoked. She has never used smokeless tobacco. She reports that she does not drink alcohol or use illicit drugs.   Prenatal Transfer Tool  Maternal Diabetes: No Genetic Screening: Normal Maternal Ultrasounds/Referrals: Normal Fetal Ultrasounds or other Referrals:  None Maternal Substance Abuse:  No Significant Maternal Medications:  Meds include: Progesterone Significant Maternal Lab Results:  Lab values include: Group B Strep negative Other Comments:  precipitous delivery in MAU  Review of Systems  Respiratory: Negative.   Cardiovascular: Negative.       unknown if currently  breastfeeding. Exam Physical Exam  Constitutional: She appears well-developed and well-nourished.  Cardiovascular: Normal rate, regular rhythm and normal heart sounds.   No murmur heard. Respiratory: Effort normal and breath sounds normal. No respiratory distress. She has no wheezes.  GI: Soft.    Prenatal labs: ABO, Rh: O/Positive/-- (05/04 0000) Antibody: Negative (05/04 0000) Rubella: Immune (05/04 0000) RPR: Nonreactive (05/04 0000)  HBsAg: Negative (05/04 0000)  HIV: Non-reactive (05/04 0000)  GBS: Negative (10/26 0000)   Assessment/Plan: IUP at 39+ weeks s/p precipitous SVD in MAU.  By report from the nurse she had a first degree laceration that did not require repair, she has been given IM pitocin.  Will admit for routine postpartum care.     Florentine Diekman D 05/26/2015, 8:50 PM

## 2015-05-27 LAB — RPR: RPR Ser Ql: NONREACTIVE

## 2015-05-27 MED ORDER — TERBUTALINE SULFATE 1 MG/ML IJ SOLN
INTRAMUSCULAR | Status: AC
  Filled 2015-05-27: qty 1

## 2015-05-27 NOTE — Progress Notes (Signed)
Patient ID: Rebecca Holt, female   DOB: 02/10/1986, 29 y.o.   MRN: [id number] #[id number] afebrile BP normal no complaints

## 2015-05-27 NOTE — Lactation Note (Signed)
This note was copied from the chart of [place]. Lactation Consultation Note  Patient Name: Rebecca Holt [id number] Date: 05/27/2015 Reason for consult: Initial assessment   With this mom of a term baby, now 32 hours old. I observed mom latching baby, and shw was resistant to my suggestions, so I told mom she was experienced, and i would leave her to feed her baby, and told her to call for questions/concerns. Mom was sitting forward, placing her nipple in the baby's mouth, and had her fingers up behind the baby's head. She was nnot bringing hthe baby in close to her breast while I was there. I briefly reviewed lactation services, and encouraged mom to start a feeding diary, at which she said "I am not good a t that".  The baby does have an upper lip frenulum that extends to the gum line, and a posterior tongue frenulum, that forms a triangle under his tongue. I was not able to evalauate his tongue function, but mom did say that one of her children had a tongue tie, that did need to be clipped.  The baby has had multiple voids and stools Mom knows to call for questions/cioncerns.    Maternal Data Formula Feeding for Exclusion: No Has patient been taught Hand Expression?: No (mom denies questions, experienced breasat feeder) Does the patient have breastfeeding experience prior to this delivery?: Yes  Feeding Feeding Type: Breast Fed Length of feed: 0 min  LATCH Score/Interventions Latch: Repeated attempts needed to sustain latch, nipple held in mouth throughout feeding, stimulation needed to elicit sucking reflex. Intervention(s): Adjust position;Assist with latch (mom resistant to help with positioning)  Audible Swallowing: None  Type of Nipple: Everted at rest and after stimulation (semi flat, very soft breast tissue)  Comfort (Breast/Nipple): Soft / non-tender     Hold (Positioning): Assistance needed to correctly position infant at breast and maintain latch. Intervention(s):  Breastfeeding basics reviewed;Support Pillows;Position options;Skin to skin  LATCH Score: 6  Lactation Tools Discussed/Used     Consult Status Consult Status: Follow-up Date: 05/28/15 Follow-up type: In-patient    Tonna Corner 05/27/2015, 9:18 AM

## 2015-05-28 NOTE — Lactation Note (Signed)
This note was copied from the chart of [place]. Lactation Consultation Note  Patient Name: Boy Tatum Novosel [id number] Date: 05/28/2015 Reason for consult: Follow-up assessment   Follow up with mom prior to D/C. Mom reports she feels BF is going well. She is an exprienced BF mom of 3 who BF up to 2 years with other children. She will have F/U ped appt on Tuesday. Infant now 64 hours old and weighs 6 lb 8.9 oz with weight loss of 7%. Infant with 14 BF for 10 minutes each, 1 attempt, 3 voids and 6 stools in last 24 hours. Encouraged mom to increase feeding times when infant at breast as infant will. Last latch score was 6 by RN. Mom denies nipple tenderness aand any breast changes in last 24 hours. Mom noted that infant stools have lightened. BF Information reviewed in Taking Care of Baby and [place]. [facility] Brochure reviewed, including phone #, Support Groups, OP Services and BF Resources. Enc mom to keep I/O record and take to Ped visit. Enc mom to call with questions/concerns.   Maternal Data Formula Feeding for Exclusion: No Has patient been taught Hand Expression?: Yes Does the patient have breastfeeding experience prior to this delivery?: Yes  Feeding    LATCH Score/Interventions                      Lactation Tools Discussed/Used Pump Review: Setup, frequency, and cleaning;Milk Storage   Consult Status Consult Status: Complete Follow-up type: Call as needed    Donn Pierini 05/28/2015, 9:23 AM

## 2015-05-28 NOTE — Progress Notes (Signed)
Patient ID: Rebecca Holt, female   DOB: [birth year], 29 y.o.   MRN: [id number] #[id number] afebrile BP normal no complaints for d/c

## 2015-05-28 NOTE — Discharge Instructions (Signed)
booklet °

## 2015-05-28 NOTE — Discharge Summary (Signed)
NAMELILLYIAN, SUTER NO.:  [id number]  MEDICAL RECORD NO.:  [id number]  LOCATION:  [id number]                          FACILITY:  [facility]  PHYSICIAN:  Lucille Passy. Ulanda Edison, M.D. DATE OF BIRTH:  January 08, 1986  DATE OF ADMISSION:  05/26/2015 DATE OF DISCHARGE:  05/28/2015                              DISCHARGE SUMMARY   HOSPITAL COURSE:  This is a 29 year old white female, __________.  She was 39+ weeks gestation with [facility] of November, 28.  She presented for contractions.  She had been having contractions every 10-15 minutes, then every 7 minutes, and came to the hospital; and on arrival, her cervix was 9+ cm.  She delivered in the maternity admission unit with __________ Dr. Willis Modena was on his way into the hospital.  Prenatal care was complicated by hypothyroidism and also history of preterm delivery, for which, she received weekly injections of 17- hydroxyprogesterone.  Dr. Kennon Rounds was present immediately following delivery of the infant and helped with the delivery of the placenta. The resident, Paula Compton, MD was called, delivered precipitously in the MAU.  Dr. Kennon Rounds was called in addition to the resident.  The resident arrived slightly early, and the infant delivered before Dr. Kennon Rounds was able to arrive.  With two pushes, the infant delivered.  The infant delivered to maternal abdomen.  Cord clamped and cut.  IM Pitocin and third stage of labor with traction, placenta delivered intact with a three-vessel cord.  For a small first-degree tear, it was not requiring repair.  Blood loss was about 200 mL.  Postpartum, the patient did very well and was discharged on the second postpartum day.  CBC was done on November 16th and was normal as far as I can tell.  None were repeated on this admission.  FINAL DIAGNOSES:  Intrauterine pregnancy at 39+ weeks, delivered vertex precipitously in the maternity admission unit.  OPERATION:  Spontaneous delivery vertex.  FINAL CONDITION:   Improved.  DISCHARGE PLAN:  The patient declines any analgesics at discharge.  She is advised to continue her prenatal vitamins.  Return to the office in 6 weeks for followup examination.  She is also to continue her levothyroxine.     Lucille Passy. Ulanda Edison, M.D.     TFH/MEDQ  D:  05/28/2015  T:  05/28/2015  Job:  [id number]

## 2015-05-29 ENCOUNTER — Inpatient Hospital Stay (HOSPITAL_COMMUNITY): Admission: RE | Admit: 2015-05-29 | Payer: 59 | Source: Ambulatory Visit

## 2018-04-07 DIAGNOSIS — M545 Low back pain: Secondary | ICD-10-CM | POA: Diagnosis not present

## 2018-04-14 DIAGNOSIS — M545 Low back pain: Secondary | ICD-10-CM | POA: Diagnosis not present

## 2018-04-14 DIAGNOSIS — M6281 Muscle weakness (generalized): Secondary | ICD-10-CM | POA: Diagnosis not present

## 2018-04-14 DIAGNOSIS — M2560 Stiffness of unspecified joint, not elsewhere classified: Secondary | ICD-10-CM | POA: Diagnosis not present

## 2018-04-27 DIAGNOSIS — J189 Pneumonia, unspecified organism: Secondary | ICD-10-CM | POA: Diagnosis not present

## 2018-04-27 DIAGNOSIS — R05 Cough: Secondary | ICD-10-CM | POA: Diagnosis not present

## 2018-07-01 HISTORY — PX: TONSILLECTOMY: SUR1361

## 2018-07-19 DIAGNOSIS — J101 Influenza due to other identified influenza virus with other respiratory manifestations: Secondary | ICD-10-CM | POA: Diagnosis not present

## 2018-08-11 DIAGNOSIS — Z124 Encounter for screening for malignant neoplasm of cervix: Secondary | ICD-10-CM | POA: Diagnosis not present

## 2018-08-11 DIAGNOSIS — Z6828 Body mass index (BMI) 28.0-28.9, adult: Secondary | ICD-10-CM | POA: Diagnosis not present

## 2018-08-11 DIAGNOSIS — Z130 Encounter for screening for diseases of the blood and blood-forming organs and certain disorders involving the immune mechanism: Secondary | ICD-10-CM | POA: Diagnosis not present

## 2018-08-11 DIAGNOSIS — Z0000 Encounter for general adult medical examination without abnormal findings: Secondary | ICD-10-CM | POA: Diagnosis not present

## 2018-08-11 DIAGNOSIS — Z113 Encounter for screening for infections with a predominantly sexual mode of transmission: Secondary | ICD-10-CM | POA: Diagnosis not present

## 2018-08-11 DIAGNOSIS — Z01419 Encounter for gynecological examination (general) (routine) without abnormal findings: Secondary | ICD-10-CM | POA: Diagnosis not present

## 2018-08-12 DIAGNOSIS — Z1151 Encounter for screening for human papillomavirus (HPV): Secondary | ICD-10-CM | POA: Diagnosis not present

## 2018-08-12 DIAGNOSIS — Z124 Encounter for screening for malignant neoplasm of cervix: Secondary | ICD-10-CM | POA: Diagnosis not present

## 2018-10-12 DIAGNOSIS — M545 Low back pain: Secondary | ICD-10-CM | POA: Diagnosis not present

## 2018-10-20 DIAGNOSIS — M545 Low back pain: Secondary | ICD-10-CM | POA: Diagnosis not present

## 2018-10-26 DIAGNOSIS — M545 Low back pain: Secondary | ICD-10-CM | POA: Diagnosis not present

## 2018-11-09 DIAGNOSIS — M5136 Other intervertebral disc degeneration, lumbar region: Secondary | ICD-10-CM | POA: Diagnosis not present

## 2018-11-16 DIAGNOSIS — J3501 Chronic tonsillitis: Secondary | ICD-10-CM | POA: Diagnosis not present

## 2018-12-01 DIAGNOSIS — M5136 Other intervertebral disc degeneration, lumbar region: Secondary | ICD-10-CM | POA: Diagnosis not present

## 2018-12-04 DIAGNOSIS — Z1159 Encounter for screening for other viral diseases: Secondary | ICD-10-CM | POA: Diagnosis not present

## 2018-12-08 ENCOUNTER — Other Ambulatory Visit: Payer: Self-pay | Admitting: Otolaryngology

## 2018-12-08 DIAGNOSIS — J3501 Chronic tonsillitis: Secondary | ICD-10-CM | POA: Diagnosis not present

## 2018-12-28 DIAGNOSIS — M5136 Other intervertebral disc degeneration, lumbar region: Secondary | ICD-10-CM | POA: Diagnosis not present

## 2018-12-29 ENCOUNTER — Other Ambulatory Visit: Payer: Self-pay | Admitting: Physical Medicine and Rehabilitation

## 2018-12-29 DIAGNOSIS — M5137 Other intervertebral disc degeneration, lumbosacral region: Secondary | ICD-10-CM

## 2018-12-29 DIAGNOSIS — M5136 Other intervertebral disc degeneration, lumbar region: Secondary | ICD-10-CM | POA: Diagnosis not present

## 2019-01-18 ENCOUNTER — Ambulatory Visit
Admission: RE | Admit: 2019-01-18 | Discharge: 2019-01-18 | Disposition: A | Discharge status: Home / Self Care | Payer: BLUE CROSS/BLUE SHIELD | Source: Ambulatory Visit | Attending: Physical Medicine and Rehabilitation | Admitting: Physical Medicine and Rehabilitation

## 2019-01-18 DIAGNOSIS — M47817 Spondylosis without myelopathy or radiculopathy, lumbosacral region: Secondary | ICD-10-CM | POA: Diagnosis not present

## 2019-01-18 DIAGNOSIS — M5137 Other intervertebral disc degeneration, lumbosacral region: Secondary | ICD-10-CM

## 2019-01-18 MED ORDER — METHYLPREDNISOLONE ACETATE 40 MG/ML INJ SUSP (RADIOLOG
120.0000 mg | Freq: Once | INTRAMUSCULAR | Status: AC
  Administered 2019-01-18: 120 mg via EPIDURAL

## 2019-01-18 MED ORDER — IOPAMIDOL (ISOVUE-M 200) INJECTION 41%
1.0000 mL | Freq: Once | INTRAMUSCULAR | Status: AC
  Administered 2019-01-18: 12:00:00 1 mL via EPIDURAL

## 2019-01-18 NOTE — Discharge Instructions (Signed)

## 2019-02-01 ENCOUNTER — Other Ambulatory Visit: Payer: Self-pay | Admitting: Orthopedic Surgery

## 2019-02-01 DIAGNOSIS — M5416 Radiculopathy, lumbar region: Secondary | ICD-10-CM

## 2019-02-02 DIAGNOSIS — F321 Major depressive disorder, single episode, moderate: Secondary | ICD-10-CM | POA: Diagnosis not present

## 2019-02-08 ENCOUNTER — Ambulatory Visit
Admission: RE | Admit: 2019-02-08 | Discharge: 2019-02-08 | Disposition: A | Discharge status: Home / Self Care | Payer: BC Managed Care – PPO | Source: Ambulatory Visit | Attending: Orthopedic Surgery | Admitting: Orthopedic Surgery

## 2019-02-08 ENCOUNTER — Other Ambulatory Visit: Payer: Self-pay | Admitting: Orthopedic Surgery

## 2019-02-08 DIAGNOSIS — G8929 Other chronic pain: Secondary | ICD-10-CM | POA: Diagnosis not present

## 2019-02-08 DIAGNOSIS — M5416 Radiculopathy, lumbar region: Secondary | ICD-10-CM

## 2019-02-08 DIAGNOSIS — F32A Depression, unspecified: Secondary | ICD-10-CM

## 2019-02-08 DIAGNOSIS — F329 Major depressive disorder, single episode, unspecified: Secondary | ICD-10-CM

## 2019-02-08 DIAGNOSIS — F339 Major depressive disorder, recurrent, unspecified: Secondary | ICD-10-CM | POA: Insufficient documentation

## 2019-02-08 DIAGNOSIS — M545 Low back pain: Secondary | ICD-10-CM | POA: Diagnosis not present

## 2019-02-08 HISTORY — DX: Major depressive disorder, single episode, unspecified: F32.9

## 2019-02-08 HISTORY — DX: Depression, unspecified: F32.A

## 2019-02-08 MED ORDER — IOPAMIDOL (ISOVUE-M 200) INJECTION 41%
1.0000 mL | Freq: Once | INTRAMUSCULAR | Status: AC
  Administered 2019-02-08: 1 mL via INTRA_ARTICULAR

## 2019-02-08 MED ORDER — METHYLPREDNISOLONE ACETATE 40 MG/ML INJ SUSP (RADIOLOG
120.0000 mg | Freq: Once | INTRAMUSCULAR | Status: AC
  Administered 2019-02-08: 120 mg via INTRA_ARTICULAR

## 2019-02-09 ENCOUNTER — Ambulatory Visit (INDEPENDENT_AMBULATORY_CARE_PROVIDER_SITE_OTHER): Payer: BC Managed Care – PPO | Admitting: Adult Health

## 2019-02-09 ENCOUNTER — Encounter: Payer: Self-pay | Admitting: Adult Health

## 2019-02-09 ENCOUNTER — Other Ambulatory Visit: Payer: Self-pay

## 2019-02-09 VITALS — Wt 186.0 lb

## 2019-02-09 DIAGNOSIS — F411 Generalized anxiety disorder: Secondary | ICD-10-CM

## 2019-02-09 DIAGNOSIS — F321 Major depressive disorder, single episode, moderate: Secondary | ICD-10-CM | POA: Diagnosis not present

## 2019-02-09 DIAGNOSIS — F332 Major depressive disorder, recurrent severe without psychotic features: Secondary | ICD-10-CM | POA: Diagnosis not present

## 2019-02-09 MED ORDER — CITALOPRAM HYDROBROMIDE 40 MG PO TABS: 40.0000 mg | ORAL_TABLET | Freq: Every day | ORAL | 2 refills | Status: DC

## 2019-02-09 NOTE — Progress Notes (Signed)
Rebecca Holt [id number] Feb 29, 1986 33 y.o.  Virtual Visit via Telephone Note  I connected with pt on 02/14/19 at  4:00 PM EDT by telephone and verified that I am speaking with the correct person using two identifiers.   I discussed the limitations, risks, security and privacy concerns of performing an evaluation and management service by telephone and the availability of in person appointments. I also discussed with the patient that there may be a patient responsible charge related to this service. The patient expressed understanding and agreed to proceed.   I discussed the assessment and treatment plan with the patient. The patient was provided an opportunity to ask questions and all were answered. The patient agreed with the plan and demonstrated an understanding of the instructions.   The patient was advised to call back or seek an in-person evaluation if the symptoms worsen or if the condition fails to improve as anticipated.  I provided 30 minutes of non-face-to-face time during this encounter.  The patient was located at home.  The provider was located at [place].   Rebecca Gell, Rebecca Holt   Subjective:   Patient ID:  Rebecca Holt is a 33 y.o. (DOB 1986/05/21) female.  Chief Complaint:  Chief Complaint  Patient presents with  . Depression  . Anxiety    HPI Rebecca Holt presents for follow-up of Depression and Anxiety.    Desribes mood today as "so-so". Pleasant. Tearful at times. Mood symptoms - feels depressed, anxious, and irritable. Stating "my mood has been getting worse lately". Also stating " I have always struggled with depression".Husband with diagnosis of bipolar disorder - smoking THC, takig Xanax, and pain pills.Husband had an affair a while back, but they have now reconciled. Stating "I grew up living with it, I married it, and gave birth to it. Does not feel manic herself- but, does get really irritable. Gets mad and throws things. Yells and screams at  family. Stating "I get so irritable, I don't know what to do". Also stating "I go from A to Z". Gets "so anxious', she feels like she will be "sick". Osessively cleans her house. Celexa 20mg  has helped "calm things down". Worked well for about 3 months, but now, "not as well". Having "increased" obsessive thoughts. Replaying things in her head over and over. Feels like she obsesses over "everything". Energy levels vary. Active, but does not have a regular exercise routine. Cannot physically do anything right now because she has a with a "collapsed disc".  Enjoys some usual interests. Spending time with family.  Appetite adequate. Weight stable. Sleeps well most nights. Averages 6 to 8 hours. Focus and concentration mostly stable. Completing tasks. Manages aspects of household.  Denies SI and HI. Denies AH and VH.   Review of Systems:  Review of Systems  Musculoskeletal: Negative for gait problem.  Neurological: Negative for tremors.  Psychiatric/Behavioral:       Please refer to HPI    Medications: I have reviewed the patient's current medications.  Current Outpatient Medications  Medication Sig Dispense Refill  . citalopram (CELEXA) 40 MG tablet Take 1 tablet (40 mg total) by mouth daily. 30 tablet 2  . fluconazole (DIFLUCAN) 150 MG tablet fluconazole 150 mg tablet    . levonorgestrel (MIRENA, 52 MG,) 20 MCG/24HR IUD Mirena 20 mcg/24 hours (5 yrs) 52 mg intrauterine device  Take by intrauterine route.    Marland Kitchen oxyCODONE (ROXICODONE) 5 MG/5ML solution oxycodone 5 mg/5 mL oral solution     No current facility-administered  medications for this visit.     Medication Side Effects: None  Allergies:  Allergies  Allergen Reactions  . Latex Other (See Comments)    Causes irritation.    Past Medical History:  Diagnosis Date  . Anxiety   . Bipolar disorder ([place])   . Chicken pox   . Depressive disorder 02/08/2019  . Hypothyroidism     Family History  Problem Relation Age of Onset  .  Diabetic kidney disease Father   . Hyperlipidemia Father   . Diabetes Father   . Bipolar disorder Mother   . Mental illness Mother        bipolar  . Cancer Paternal Grandfather        colon  . Thyroid cancer Maternal Grandmother   . Cancer Maternal Grandfather        leukemia  . Anesthesia problems Neg Hx   . Hypotension Neg Hx   . Malignant hyperthermia Neg Hx   . Pseudochol deficiency Neg Hx     Social History   Socioeconomic History  . Marital status: Married    Spouse name: Not on file  . Number of children: Not on file  . Years of education: Not on file  . Highest education level: Not on file  Occupational History  . Not on file  Social Needs  . Financial resource strain: Not on file  . Food insecurity    Worry: Not on file    Inability: Not on file  . Transportation needs    Medical: Not on file    Non-medical: Not on file  Tobacco Use  . Smoking status: Never Smoker  . Smokeless tobacco: Never Used  Substance and Sexual Activity  . Alcohol use: No  . Drug use: No  . Sexual activity: Yes    Birth control/protection: None    Comment: tubal after   Lifestyle  . Physical activity    Days per week: Not on file    Minutes per session: Not on file  . Stress: Not on file  Relationships  . Social Herbalist on phone: Not on file    Gets together: Not on file    Attends religious service: Not on file    Active member of club or organization: Not on file    Attends meetings of clubs or organizations: Not on file    Relationship status: Not on file  . Intimate partner violence    Fear of current or ex partner: Not on file    Emotionally abused: Not on file    Physically abused: Not on file    Forced sexual activity: Not on file  Other Topics Concern  . Not on file  Social History Narrative  . Not on file    Past Medical History, Surgical history, Social history, and Family history were reviewed and updated as appropriate.   Please see review  of systems for further details on the patient's review from today.   Objective:   Physical Exam:  Wt 186 lb (84.4 kg)   LMP 01/15/2019   BMI 29.13 kg/m   Physical Exam Constitutional:      General: She is not in acute distress.    Appearance: She is well-developed.  Musculoskeletal:        General: No deformity.  Neurological:     Mental Status: She is alert and oriented to person, place, and time.     Coordination: Coordination normal.  Psychiatric:  Attention and Perception: Attention and perception normal. She does not perceive auditory or visual hallucinations.        Mood and Affect: Mood is anxious and depressed.        Speech: Speech normal.        Behavior: Behavior normal. Behavior is cooperative.        Thought Content: Thought content normal. Thought content is not paranoid or delusional. Thought content does not include homicidal or suicidal ideation. Thought content does not include homicidal or suicidal plan.        Cognition and Memory: Cognition and memory normal.        Judgment: Judgment normal.     Comments: Insight intact     Lab Review:     Component Value Date/Time   NA 133 (L) 05/17/2015 2210   K 3.4 (L) 05/17/2015 2210   CL 102 05/17/2015 2210   CO2 22 05/17/2015 2210   GLUCOSE 139 (H) 05/17/2015 2210   BUN 8 05/17/2015 2210   CREATININE 0.68 05/17/2015 2210   CALCIUM 9.4 05/17/2015 2210   PROT 7.1 05/17/2015 2210   ALBUMIN 3.5 05/17/2015 2210   AST 36 05/17/2015 2210   ALT 37 05/17/2015 2210   ALKPHOS 106 05/17/2015 2210   BILITOT 0.3 05/17/2015 2210   GFRNONAA >60 05/17/2015 2210   GFRAA >60 05/17/2015 2210       Component Value Date/Time   WBC 9.1 05/17/2015 2210   RBC 4.34 05/17/2015 2210   HGB 11.9 (L) 05/17/2015 2210   HCT 36.5 05/17/2015 2210   PLT 261 05/17/2015 2210   MCV 84.1 05/17/2015 2210   MCH 27.4 05/17/2015 2210   MCHC 32.6 05/17/2015 2210   RDW 14.8 05/17/2015 2210    No results found for: POCLITH, LITHIUM    No results found for: PHENYTOIN, PHENOBARB, VALPROATE, CBMZ   .res Assessment: Plan:    Plan:  1. Increase Celexa 20mg  to 40mg  daily to target mood symptoms.   RTC 4 weeks  Patient advised to contact office with any questions, adverse effects, or acute worsening in signs and symptoms.   Adisen was seen today for depression and anxiety.  Diagnoses and all orders for this visit:  Generalized anxiety disorder -     citalopram (CELEXA) 40 MG tablet; Take 1 tablet (40 mg total) by mouth daily.  Severe episode of recurrent major depressive disorder, without psychotic features ([place]) -     citalopram (CELEXA) 40 MG tablet; Take 1 tablet (40 mg total) by mouth daily.    Please see After Visit Summary for patient specific instructions.  Future Appointments  Date Time Provider [place]  03/09/2019  9:00 AM Naba Sneed, Berdie Ogren, Rebecca Holt CP-CP None    No orders of the defined types were placed in this encounter.     -------------------------------

## 2019-02-14 ENCOUNTER — Encounter: Payer: Self-pay | Admitting: Adult Health

## 2019-02-16 DIAGNOSIS — F341 Dysthymic disorder: Secondary | ICD-10-CM | POA: Diagnosis not present

## 2019-02-16 DIAGNOSIS — F411 Generalized anxiety disorder: Secondary | ICD-10-CM | POA: Diagnosis not present

## 2019-02-22 DIAGNOSIS — M5136 Other intervertebral disc degeneration, lumbar region: Secondary | ICD-10-CM | POA: Diagnosis not present

## 2019-02-25 DIAGNOSIS — F341 Dysthymic disorder: Secondary | ICD-10-CM | POA: Diagnosis not present

## 2019-02-25 DIAGNOSIS — F411 Generalized anxiety disorder: Secondary | ICD-10-CM | POA: Diagnosis not present

## 2019-03-02 ENCOUNTER — Other Ambulatory Visit: Payer: Self-pay | Admitting: Orthopedic Surgery

## 2019-03-04 DIAGNOSIS — F341 Dysthymic disorder: Secondary | ICD-10-CM | POA: Diagnosis not present

## 2019-03-04 DIAGNOSIS — F411 Generalized anxiety disorder: Secondary | ICD-10-CM | POA: Diagnosis not present

## 2019-03-09 ENCOUNTER — Ambulatory Visit: Payer: BC Managed Care – PPO | Admitting: Adult Health

## 2019-03-10 ENCOUNTER — Ambulatory Visit (INDEPENDENT_AMBULATORY_CARE_PROVIDER_SITE_OTHER): Payer: BC Managed Care – PPO | Admitting: Adult Health

## 2019-03-10 ENCOUNTER — Other Ambulatory Visit: Payer: Self-pay

## 2019-03-10 ENCOUNTER — Encounter: Payer: Self-pay | Admitting: Adult Health

## 2019-03-10 DIAGNOSIS — F411 Generalized anxiety disorder: Secondary | ICD-10-CM | POA: Diagnosis not present

## 2019-03-10 DIAGNOSIS — F332 Major depressive disorder, recurrent severe without psychotic features: Secondary | ICD-10-CM | POA: Diagnosis not present

## 2019-03-10 DIAGNOSIS — F429 Obsessive-compulsive disorder, unspecified: Secondary | ICD-10-CM | POA: Diagnosis not present

## 2019-03-10 MED ORDER — LORAZEPAM 0.5 MG PO TABS: ORAL_TABLET | ORAL | 2 refills | Status: DC

## 2019-03-10 NOTE — Progress Notes (Signed)
Rebecca Holt [id number] October 12, 1985 33 y.o.  Virtual Visit via Telephone Note  I connected with pt on 03/10/19 at  9:30 AM EDT by telephone and verified that I am speaking with the correct person using two identifiers.   I discussed the limitations, risks, security and privacy concerns of performing an evaluation and management service by telephone and the availability of in person appointments. I also discussed with the patient that there may be a patient responsible charge related to this service. The patient expressed understanding and agreed to proceed.   I discussed the assessment and treatment plan with the patient. The patient was provided an opportunity to ask questions and all were answered. The patient agreed with the plan and demonstrated an understanding of the instructions.   The patient was advised to call back or seek an in-person evaluation if the symptoms worsen or if the condition fails to improve as anticipated.  I provided 30 minutes of non-face-to-face time during this encounter.  The patient was located at home.  The provider was located at [place].   Aloha Gell, NP   Subjective:   Patient ID:  Rebecca Holt is a 33 y.o. (DOB 09/20/1985) female.  Chief Complaint:  No chief complaint on file.   HPI Rebecca Holt presents for follow-up of Depression and Anxiety.    Desribes mood today as "a little better". Pleasant. Mood symptoms - not feeling as depressed, anxious, and irritable. Feels a lot less "mopey". Not "sitting around crying". Denies side effects. Stating "I think the medication has helped". Stating "I do feel like blowing my top some days". Not as agitated. Gets "over the top at times". Stating "Is it me or circumstances"? Husband has been doing better - back on medications. Not obsessing as much. Not replaying things as much. Easier to get out of the cycle. Highly irritated this morning. Starting virtual school this morning - needing to get  books. Upcoming surgery - spinal fusion on September 24th at [facility]. Feels nervous because it's a "big surgery". Recovery up to 6 months. Stable interest and motivation. Taking medications as prescribed.  Energy levels stable. Active, but does not have a regular exercise routine with physical limitations.  Enjoys some usual interests. Spending time with family - husband and children. 35 year old with Bipolar Disorder - see Dr Creig Hines. Appetite adequate. Weight stable. Sleeps well most nights. Averages 6 to 8 hours. Focus and concentration mostly stable. Completing tasks. Manages some aspects of household. Doing what I can, but pay for it later. Denies SI and HI. Denies AH and VH.   Review of Systems:  Review of Systems  Musculoskeletal: Negative for gait problem.  Neurological: Negative for tremors.  Psychiatric/Behavioral:       Please refer to HPI    Medications: I have reviewed the patient's current medications.  Current Outpatient Medications  Medication Sig Dispense Refill  . citalopram (CELEXA) 40 MG tablet Take 1 tablet (40 mg total) by mouth daily. 30 tablet 2  . fluconazole (DIFLUCAN) 150 MG tablet fluconazole 150 mg tablet    . levonorgestrel (MIRENA, 52 MG,) 20 MCG/24HR IUD Mirena 20 mcg/24 hours (5 yrs) 52 mg intrauterine device  Take by intrauterine route.    Marland Kitchen LORazepam (ATIVAN) 0.5 MG tablet Take one tablet daily prn anxiety. 30 tablet 2  . oxyCODONE (ROXICODONE) 5 MG/5ML solution oxycodone 5 mg/5 mL oral solution     No current facility-administered medications for this visit.     Medication Side Effects:  None  Allergies:  Allergies  Allergen Reactions  . Latex Other (See Comments)    Causes irritation.    Past Medical History:  Diagnosis Date  . Anxiety   . Bipolar disorder ([place])   . Chicken pox   . Depressive disorder 02/08/2019  . Hypothyroidism     Family History  Problem Relation Age of Onset  . Diabetic kidney disease Father   . Hyperlipidemia  Father   . Diabetes Father   . Bipolar disorder Mother   . Mental illness Mother        bipolar  . Cancer Paternal Grandfather        colon  . Thyroid cancer Maternal Grandmother   . Cancer Maternal Grandfather        leukemia  . Anesthesia problems Neg Hx   . Hypotension Neg Hx   . Malignant hyperthermia Neg Hx   . Pseudochol deficiency Neg Hx     Social History   Socioeconomic History  . Marital status: Married    Spouse name: Not on file  . Number of children: Not on file  . Years of education: Not on file  . Highest education level: Not on file  Occupational History  . Not on file  Social Needs  . Financial resource strain: Not on file  . Food insecurity    Worry: Not on file    Inability: Not on file  . Transportation needs    Medical: Not on file    Non-medical: Not on file  Tobacco Use  . Smoking status: Never Smoker  . Smokeless tobacco: Never Used  Substance and Sexual Activity  . Alcohol use: No  . Drug use: No  . Sexual activity: Yes    Birth control/protection: None    Comment: tubal after   Lifestyle  . Physical activity    Days per week: Not on file    Minutes per session: Not on file  . Stress: Not on file  Relationships  . Social Herbalist on phone: Not on file    Gets together: Not on file    Attends religious service: Not on file    Active member of club or organization: Not on file    Attends meetings of clubs or organizations: Not on file    Relationship status: Not on file  . Intimate partner violence    Fear of current or ex partner: Not on file    Emotionally abused: Not on file    Physically abused: Not on file    Forced sexual activity: Not on file  Other Topics Concern  . Not on file  Social History Narrative  . Not on file    Past Medical History, Surgical history, Social history, and Family history were reviewed and updated as appropriate.   Please see review of systems for further details on the patient's  review from today.   Objective:   Physical Exam:  There were no vitals taken for this visit.  Physical Exam Constitutional:      General: She is not in acute distress.    Appearance: She is well-developed.  Musculoskeletal:        General: No deformity.  Neurological:     Mental Status: She is alert and oriented to person, place, and time.     Coordination: Coordination normal.  Psychiatric:        Attention and Perception: Attention and perception normal. She does not perceive auditory or visual hallucinations.  Mood and Affect: Mood is anxious and depressed.        Speech: Speech normal.        Behavior: Behavior normal. Behavior is cooperative.        Thought Content: Thought content normal. Thought content is not paranoid or delusional. Thought content does not include homicidal or suicidal ideation. Thought content does not include homicidal or suicidal plan.        Cognition and Memory: Cognition and memory normal.        Judgment: Judgment normal.     Comments: Insight intact     Lab Review:     Component Value Date/Time   NA 133 (L) 05/17/2015 2210   K 3.4 (L) 05/17/2015 2210   CL 102 05/17/2015 2210   CO2 22 05/17/2015 2210   GLUCOSE 139 (H) 05/17/2015 2210   BUN 8 05/17/2015 2210   CREATININE 0.68 05/17/2015 2210   CALCIUM 9.4 05/17/2015 2210   PROT 7.1 05/17/2015 2210   ALBUMIN 3.5 05/17/2015 2210   AST 36 05/17/2015 2210   ALT 37 05/17/2015 2210   ALKPHOS 106 05/17/2015 2210   BILITOT 0.3 05/17/2015 2210   GFRNONAA >60 05/17/2015 2210   GFRAA >60 05/17/2015 2210       Component Value Date/Time   WBC 9.1 05/17/2015 2210   RBC 4.34 05/17/2015 2210   HGB 11.9 (L) 05/17/2015 2210   HCT 36.5 05/17/2015 2210   PLT 261 05/17/2015 2210   MCV 84.1 05/17/2015 2210   MCH 27.4 05/17/2015 2210   MCHC 32.6 05/17/2015 2210   RDW 14.8 05/17/2015 2210    No results found for: POCLITH, LITHIUM   No results found for: PHENYTOIN, PHENOBARB, VALPROATE,  CBMZ   .res Assessment: Plan:    Plan:  Continue Celexa 40mg  daily to target mood symptoms.  Add Lorazepam 0.5mg  daily prn anxiety.   RTC 4 weeks  Patient advised to contact office with any questions, adverse effects, or acute worsening in signs and symptoms.   Diagnoses and all orders for this visit:  Generalized anxiety disorder -     LORazepam (ATIVAN) 0.5 MG tablet; Take one tablet daily prn anxiety.  Severe episode of recurrent major depressive disorder, without psychotic features ([place])    Please see After Visit Summary for patient specific instructions.  Future Appointments  Date Time Provider [place]  03/16/2019  9:00 AM [facility] PAT 2 [facility] None  03/22/2019  9:50 AM [facility] None    No orders of the defined types were placed in this encounter.     -------------------------------

## 2019-03-15 NOTE — Pre-Procedure Instructions (Signed)
Rebecca Holt [place] [address] [place] [address] Phone: [phone] Fax: [phone]      Your procedure is scheduled on Thursday September 24th.  Report to [facility] Main Entrance "A" at 5:30 A.M., and check in at the Admitting office.  Call this number if you have problems the morning of surgery:  [phone]  Call [phone] if you have any questions prior to your surgery date Monday-Friday 8am-4pm    Remember:  Do not eat after midnight the night before your surgery  You may drink clear liquids until 4:30 AM the morning of your surgery.   Clear liquids allowed are: Water, Non-Citrus Juices (without pulp), Carbonated Beverages, Clear Tea, Black Coffee Only, and Gatorade. Your surgeon has prescribed an Ensure Presurgery Carbohydrate drink. Drink all in one sitting, do not sip. Complete by 4:30 AM the morning of your surgery.  Patient Instructions  . The night before surgery:  o No food after midnight. ONLY clear liquids after midnight  . The day of surgery (if you do NOT have diabetes):  o Drink ONE (1) Pre-Surgery Clear Ensure as directed.   o This drink was given to you during your hospital  pre-op appointment visit. o The pre-op nurse will instruct you on the time to drink the  Pre-Surgery Ensure depending on your surgery time. o Finish the drink at the designated time by the pre-op nurse.  o Nothing else to drink after completing the  Pre-Surgery Clear Ensure.         If you have questions, please contact your surgeon's office.     Take these medicines the morning of surgery with A SIP OF WATER  citalopram (CELEXA)  LORazepam (ATIVAN)-if needed   7 days prior to surgery STOP taking any Aspirin (unless otherwise instructed by your surgeon), Aleve, Naproxen, Ibuprofen, Motrin, Advil, Goody's, BC's, all herbal medications, fish oil, and all vitamins.    The Morning of Surgery  Do not wear jewelry,  make-up or nail polish.  Do not wear lotions, powders, or perfumes/colognes, or deodorant  Do not shave 48 hours prior to surgery.  Men may shave face and neck.  Do not bring valuables to the hospital.  [facility] is not responsible for any belongings or valuables.  If you are a smoker, DO NOT Smoke 24 hours prior to surgery IF you wear a CPAP at night please bring your mask, tubing, and machine the morning of surgery   Remember that you must have someone to transport you home after your surgery, and remain with you for 24 hours if you are discharged the same day.   Contacts, glasses, hearing aids, dentures or bridgework may not be worn into surgery.    Leave your suitcase in the car.  After surgery it may be brought to your room.  For patients admitted to the hospital, discharge time will be determined by your treatment team.  Patients discharged the day of surgery will not be allowed to drive home.    Special instructions:   [place]- Preparing For Surgery  Before surgery, you can play an important role. Because skin is not sterile, your skin needs to be as free of germs as possible. You can reduce the number of germs on your skin by washing with CHG (chlorahexidine gluconate) Soap before surgery.  CHG is an antiseptic cleaner which kills germs and bonds with the skin to continue killing germs even after washing.  Oral Hygiene is also important to reduce your risk of infection.  Remember - BRUSH YOUR TEETH THE MORNING OF SURGERY WITH YOUR REGULAR TOOTHPASTE  Please do not use if you have an allergy to CHG or antibacterial soaps. If your skin becomes reddened/irritated stop using the CHG.  Do not shave (including legs and underarms) for at least 48 hours prior to first CHG shower. It is OK to shave your face.  Please follow these instructions carefully.   1. Shower the NIGHT BEFORE SURGERY and the MORNING OF SURGERY with CHG Soap.   2. If you chose to wash your hair, wash  your hair first as usual with your normal shampoo.  3. After you shampoo, rinse your hair and body thoroughly to remove the shampoo.  4. Use CHG as you would any other liquid soap. You can apply CHG directly to the skin and wash gently with a scrungie or a clean washcloth.   5. Apply the CHG Soap to your body ONLY FROM THE NECK DOWN.  Do not use on open wounds or open sores. Avoid contact with your eyes, ears, mouth and genitals (private parts). Wash Face and genitals (private parts)  with your normal soap.   6. Wash thoroughly, paying special attention to the area where your surgery will be performed.  7. Thoroughly rinse your body with warm water from the neck down.  8. DO NOT shower/wash with your normal soap after using and rinsing off the CHG Soap.  9. Pat yourself dry with a CLEAN TOWEL.  10. Wear CLEAN PAJAMAS to bed the night before surgery, wear comfortable clothes the morning of surgery  11. Place CLEAN SHEETS on your bed the night of your first shower and DO NOT SLEEP WITH PETS.    Day of Surgery:  Do not apply any deodorants/lotions. Please shower the morning of surgery with the CHG soap  Please wear clean clothes to the hospital/surgery center.   Remember to brush your teeth WITH YOUR REGULAR TOOTHPASTE.   Please read over the following fact sheets that you were given.

## 2019-03-16 ENCOUNTER — Encounter (HOSPITAL_COMMUNITY): Payer: Self-pay

## 2019-03-16 ENCOUNTER — Encounter (HOSPITAL_COMMUNITY)
Admission: RE | Admit: 2019-03-16 | Discharge: 2019-03-16 | Disposition: A | Discharge status: Home / Self Care | Payer: BC Managed Care – PPO | Source: Ambulatory Visit | Attending: Orthopedic Surgery | Admitting: Orthopedic Surgery

## 2019-03-16 ENCOUNTER — Other Ambulatory Visit: Payer: Self-pay

## 2019-03-16 DIAGNOSIS — Z01812 Encounter for preprocedural laboratory examination: Secondary | ICD-10-CM | POA: Diagnosis not present

## 2019-03-16 HISTORY — DX: Pneumonia, unspecified organism: J18.9

## 2019-03-16 LAB — COMPREHENSIVE METABOLIC PANEL
ALT: 22 U/L (ref 0–44)
AST: 21 U/L (ref 15–41)
Albumin: 3.9 g/dL (ref 3.5–5.0)
Alkaline Phosphatase: 55 U/L (ref 38–126)
Anion gap: 12 (ref 5–15)
BUN: 8 mg/dL (ref 6–20)
CO2: 22 mmol/L (ref 22–32)
Calcium: 9.1 mg/dL (ref 8.9–10.3)
Chloride: 102 mmol/L (ref 98–111)
Creatinine, Ser: 0.68 mg/dL (ref 0.44–1.00)
GFR calc Af Amer: >60 mL/min (ref >60)
GFR calc non Af Amer: >60 mL/min (ref >60)
Glucose, Bld: 94 mg/dL (ref 70–99)
Potassium: 3.7 mmol/L (ref 3.5–5.1)
Sodium: 136 mmol/L (ref 135–145)
Total Bilirubin: 0.6 mg/dL (ref 0.3–1.2)
Total Protein: 7 g/dL (ref 6.5–8.1)

## 2019-03-16 LAB — CBC WITH DIFFERENTIAL/PLATELET
Abs Immature Granulocytes: 0.01 10*3/uL (ref 0.00–0.07)
Basophils Absolute: 0 10*3/uL (ref 0.0–0.1)
Basophils Relative: 1 %
Eosinophils Absolute: 0.1 10*3/uL (ref 0.0–0.5)
Eosinophils Relative: 1 %
HCT: 38.4 % (ref 36.0–46.0)
Hemoglobin: 12.4 g/dL (ref 12.0–15.0)
Immature Granulocytes: 0 %
Lymphocytes Relative: 28 %
Lymphs Abs: 1.6 10*3/uL (ref 0.7–4.0)
MCH: 28 pg (ref 26.0–34.0)
MCHC: 32.3 g/dL (ref 30.0–36.0)
MCV: 86.7 fL (ref 80.0–100.0)
Monocytes Absolute: 0.4 10*3/uL (ref 0.1–1.0)
Monocytes Relative: 7 %
Neutro Abs: 3.6 10*3/uL (ref 1.7–7.7)
Neutrophils Relative %: 63 %
Platelets: 268 10*3/uL (ref 150–400)
RBC: 4.43 MIL/uL (ref 3.87–5.11)
RDW: 12.3 % (ref 11.5–15.5)
WBC: 5.8 10*3/uL (ref 4.0–10.5)
nRBC: 0 % (ref 0.0–0.2)

## 2019-03-16 LAB — URINALYSIS, ROUTINE W REFLEX MICROSCOPIC
Bilirubin Urine: NEGATIVE
Glucose, UA: NEGATIVE mg/dL
Hgb urine dipstick: NEGATIVE
Ketones, ur: NEGATIVE mg/dL
Leukocytes,Ua: NEGATIVE
Nitrite: NEGATIVE
Protein, ur: NEGATIVE mg/dL
Specific Gravity, Urine: 1.019 (ref 1.005–1.030)
pH: 5 (ref 5.0–8.0)

## 2019-03-16 LAB — TYPE AND SCREEN
ABO/RH(D): O POS
Antibody Screen: NEGATIVE

## 2019-03-16 LAB — ABO/RH: ABO/RH(D): O POS

## 2019-03-16 LAB — SURGICAL PCR SCREEN
MRSA, PCR: NEGATIVE
Staphylococcus aureus: POSITIVE — AB

## 2019-03-16 LAB — PROTIME-INR
INR: 1 (ref 0.8–1.2)
Prothrombin Time: 12.7 seconds (ref 11.4–15.2)

## 2019-03-16 LAB — APTT: aPTT: 35 seconds (ref 24–36)

## 2019-03-16 NOTE — Progress Notes (Signed)
Left message on patient's provided number with positive Staff result and that RX will be at [place].  Called in RX request to pharmacy.

## 2019-03-16 NOTE — Progress Notes (Signed)
PCP:  denies Cardiologist:  denies  EKG:  N/A CXR:  N/A ECHO:  N/A Stress Test:  N/A Cardiac Cath:  N/A  Patient denies shortness of breath, fever, cough, and chest pain at PAT appointment.  Patient verbalized understanding of instructions provided today at the PAT appointment.  Patient asked to review instructions at home and day of surgery.

## 2019-03-17 ENCOUNTER — Encounter (HOSPITAL_COMMUNITY): Payer: Self-pay | Admitting: Vascular Surgery

## 2019-03-19 DIAGNOSIS — F411 Generalized anxiety disorder: Secondary | ICD-10-CM | POA: Diagnosis not present

## 2019-03-19 DIAGNOSIS — F341 Dysthymic disorder: Secondary | ICD-10-CM | POA: Diagnosis not present

## 2019-03-22 ENCOUNTER — Inpatient Hospital Stay (HOSPITAL_COMMUNITY)
Admission: RE | Admit: 2019-03-22 | Discharge: 2019-03-22 | Disposition: A | Discharge status: Home / Self Care | Payer: BC Managed Care – PPO | Source: Ambulatory Visit

## 2019-03-22 NOTE — Progress Notes (Signed)
LVM regarding pt not showing up for the COVID test.

## 2019-03-25 ENCOUNTER — Inpatient Hospital Stay (HOSPITAL_COMMUNITY)
Admission: RE | Admit: 2019-03-25 | Payer: BC Managed Care – PPO | Source: Home / Self Care | Admitting: Orthopedic Surgery

## 2019-03-25 ENCOUNTER — Encounter (HOSPITAL_COMMUNITY): Admission: RE | Payer: Self-pay | Source: Home / Self Care

## 2019-03-25 DIAGNOSIS — M5137 Other intervertebral disc degeneration, lumbosacral region: Secondary | ICD-10-CM | POA: Diagnosis not present

## 2019-03-25 DIAGNOSIS — M5417 Radiculopathy, lumbosacral region: Secondary | ICD-10-CM | POA: Diagnosis not present

## 2019-03-25 DIAGNOSIS — M5126 Other intervertebral disc displacement, lumbar region: Secondary | ICD-10-CM | POA: Diagnosis not present

## 2019-03-25 DIAGNOSIS — M545 Low back pain: Secondary | ICD-10-CM | POA: Diagnosis not present

## 2019-03-25 SURGERY — TRANSFORAMINAL LUMBAR INTERBODY FUSION (TLIF) WITH PEDICLE SCREW FIXATION 1 LEVEL
Anesthesia: General | Laterality: Left

## 2019-04-09 DIAGNOSIS — F411 Generalized anxiety disorder: Secondary | ICD-10-CM | POA: Diagnosis not present

## 2019-04-09 DIAGNOSIS — F341 Dysthymic disorder: Secondary | ICD-10-CM | POA: Diagnosis not present

## 2019-04-12 ENCOUNTER — Other Ambulatory Visit: Payer: Self-pay

## 2019-04-12 ENCOUNTER — Encounter: Payer: Self-pay | Admitting: Adult Health

## 2019-04-12 ENCOUNTER — Ambulatory Visit (INDEPENDENT_AMBULATORY_CARE_PROVIDER_SITE_OTHER): Payer: BC Managed Care – PPO | Admitting: Adult Health

## 2019-04-12 DIAGNOSIS — F411 Generalized anxiety disorder: Secondary | ICD-10-CM

## 2019-04-12 DIAGNOSIS — G4700 Insomnia, unspecified: Secondary | ICD-10-CM | POA: Diagnosis not present

## 2019-04-12 DIAGNOSIS — F332 Major depressive disorder, recurrent severe without psychotic features: Secondary | ICD-10-CM

## 2019-04-12 DIAGNOSIS — F429 Obsessive-compulsive disorder, unspecified: Secondary | ICD-10-CM

## 2019-04-12 MED ORDER — ARIPIPRAZOLE 2 MG PO TABS: 2.0000 mg | ORAL_TABLET | Freq: Every day | ORAL | 2 refills | Status: DC

## 2019-04-12 NOTE — Progress Notes (Signed)
Rebecca Holt [id number] 05-01-1986 33 y.o.  Virtual Visit via Telephone Note  I connected with pt on 04/12/19 at  2:40 PM EDT by telephone and verified that I am speaking with the correct person using two identifiers.   I discussed the limitations, risks, security and privacy concerns of performing an evaluation and management service by telephone and the availability of in person appointments. I also discussed with the patient that there may be a patient responsible charge related to this service. The patient expressed understanding and agreed to proceed.   I discussed the assessment and treatment plan with the patient. The patient was provided an opportunity to ask questions and all were answered. The patient agreed with the plan and demonstrated an understanding of the instructions.   The patient was advised to call back or seek an in-person evaluation if the symptoms worsen or if the condition fails to improve as anticipated.  I provided 30 minutes of non-face-to-face time during this encounter.  The patient was located at home.  The provider was located at [place].   Aloha Gell, NP   Subjective:   Patient ID:  Rebecca Holt is a 33 y.o. (DOB Feb 21, 1986) female.  Chief Complaint:  No chief complaint on file.   HPI Rebecca Holt presents for follow-up of Depression and Anxiety.    Desribes mood today as "not the best". Pleasant. Tearful at times. Mood symptoms - feels depressed, anxious, and irritable. Stating "I'm worrying, ruminating depressed all the time".  Stating "I felt better for a while, now it's gone". Stating "that's how it always goes". Has tried "several antidepressants". Feels like things work ok for a while and then goes back to where she was. Was scheduled to have surgery and it has been denied by insurance - "it's not serious enough". Having to push through her pain to get things done around the house. Increased situational stressors. Husband and son  both Bipolar. Decreased interest and motivation. Taking medications as prescribed.  Energy levels stable. Active, but does not have a regular exercise routine with physical limitations. Supposed to have surgery but insurance denied it 2 days before surgery.  Enjoys some usual interests. Spending time with family - husband and children.  Appetite adequate. Weight stable. Sleeps better some nights than others. Averages 4 to 5 hours with Ativan.  Mind racing all the time.  Focus and concentration "ok". Completing tasks. Manages some aspects of household. Doing the bare minimum.  Denies SI and HI. Denies AH and VH.   Previous medications: Zoloft, Lexapro,    Review of Systems:  Review of Systems  Musculoskeletal: Negative for gait problem.  Neurological: Negative for tremors.  Psychiatric/Behavioral: Positive for sleep disturbance. The patient is nervous/anxious.        Please refer to HPI    Medications: I have reviewed the patient's current medications.  Current Outpatient Medications  Medication Sig Dispense Refill  . ARIPiprazole (ABILIFY) 2 MG tablet Take 1 tablet (2 mg total) by mouth daily. 30 tablet 2  . b complex vitamins tablet Take 1 tablet by mouth daily.    . cholecalciferol (VITAMIN D3) 25 MCG (1000 UT) tablet Take 1,000 Units by mouth daily.    . citalopram (CELEXA) 40 MG tablet Take 1 tablet (40 mg total) by mouth daily. 30 tablet 2  . LORazepam (ATIVAN) 0.5 MG tablet Take one tablet daily prn anxiety. (Patient taking differently: Take 0.5 mg by mouth daily as needed for anxiety. Take one tablet daily prn  anxiety.) 30 tablet 2  . Lysine 500 MG CAPS Take 500 mg by mouth daily.    . vitamin E 400 UNIT capsule Take 400 Units by mouth daily.     No current facility-administered medications for this visit.     Medication Side Effects: None  Allergies:  Allergies  Allergen Reactions  . Latex Other (See Comments)    Causes irritation.    Past Medical History:   Diagnosis Date  . Anxiety   . Bipolar disorder ([place])   . Chicken pox   . Depressive disorder 02/08/2019  . Hypothyroidism   . Pneumonia     Family History  Problem Relation Age of Onset  . Diabetic kidney disease Father   . Hyperlipidemia Father   . Diabetes Father   . Bipolar disorder Mother   . Mental illness Mother        bipolar  . Cancer Paternal Grandfather        colon  . Thyroid cancer Maternal Grandmother   . Cancer Maternal Grandfather        leukemia  . Anesthesia problems Neg Hx   . Hypotension Neg Hx   . Malignant hyperthermia Neg Hx   . Pseudochol deficiency Neg Hx     Social History   Socioeconomic History  . Marital status: Married    Spouse name: Not on file  . Number of children: Not on file  . Years of education: Not on file  . Highest education level: Not on file  Occupational History  . Not on file  Social Needs  . Financial resource strain: Not on file  . Food insecurity    Worry: Not on file    Inability: Not on file  . Transportation needs    Medical: Not on file    Non-medical: Not on file  Tobacco Use  . Smoking status: Never Smoker  . Smokeless tobacco: Never Used  Substance and Sexual Activity  . Alcohol use: No  . Drug use: No  . Sexual activity: Yes    Birth control/protection: None    Comment: tubal after   Lifestyle  . Physical activity    Days per week: Not on file    Minutes per session: Not on file  . Stress: Not on file  Relationships  . Social Herbalist on phone: Not on file    Gets together: Not on file    Attends religious service: Not on file    Active member of club or organization: Not on file    Attends meetings of clubs or organizations: Not on file    Relationship status: Not on file  . Intimate partner violence    Fear of current or ex partner: Not on file    Emotionally abused: Not on file    Physically abused: Not on file    Forced sexual activity: Not on file  Other Topics Concern  .  Not on file  Social History Narrative  . Not on file    Past Medical History, Surgical history, Social history, and Family history were reviewed and updated as appropriate.   Please see review of systems for further details on the patient's review from today.   Objective:   Physical Exam:  There were no vitals taken for this visit.  Physical Exam Constitutional:      General: She is not in acute distress.    Appearance: She is well-developed.  Musculoskeletal:  General: No deformity.  Neurological:     Mental Status: She is alert and oriented to person, place, and time.     Coordination: Coordination normal.  Psychiatric:        Attention and Perception: Attention and perception normal. She does not perceive auditory or visual hallucinations.        Mood and Affect: Mood is anxious and depressed. Affect is not labile, blunt, angry or inappropriate.        Speech: Speech normal.        Behavior: Behavior normal. Behavior is cooperative.        Thought Content: Thought content normal. Thought content is not paranoid or delusional. Thought content does not include homicidal or suicidal ideation. Thought content does not include homicidal or suicidal plan.        Cognition and Memory: Cognition and memory normal.        Judgment: Judgment normal.     Comments: Insight intact     Lab Review:     Component Value Date/Time   NA 136 03/16/2019 0926   K 3.7 03/16/2019 0926   CL 102 03/16/2019 0926   CO2 22 03/16/2019 0926   GLUCOSE 94 03/16/2019 0926   BUN 8 03/16/2019 0926   CREATININE 0.68 03/16/2019 0926   CALCIUM 9.1 03/16/2019 0926   PROT 7.0 03/16/2019 0926   ALBUMIN 3.9 03/16/2019 0926   AST 21 03/16/2019 0926   ALT 22 03/16/2019 0926   ALKPHOS 55 03/16/2019 0926   BILITOT 0.6 03/16/2019 0926   GFRNONAA >60 03/16/2019 0926   GFRAA >60 03/16/2019 0926       Component Value Date/Time   WBC 5.8 03/16/2019 0926   RBC 4.43 03/16/2019 0926   HGB 12.4  03/16/2019 0926   HCT 38.4 03/16/2019 0926   PLT 268 03/16/2019 0926   MCV 86.7 03/16/2019 0926   MCH 28.0 03/16/2019 0926   MCHC 32.3 03/16/2019 0926   RDW 12.3 03/16/2019 0926   LYMPHSABS 1.6 03/16/2019 0926   MONOABS 0.4 03/16/2019 0926   EOSABS 0.1 03/16/2019 0926   BASOSABS 0.0 03/16/2019 0926    No results found for: POCLITH, LITHIUM   No results found for: PHENYTOIN, PHENOBARB, VALPROATE, CBMZ   .res Assessment: Plan:    Plan:  Celexa 40mg  daily to target mood symptoms.  Lorazepam 0.5mg  daily prn anxiety.  Add Abilify 2mg  daily for mood instability  Also consider - Rexulti or Latuda  RTC 4 weeks  Patient advised to contact office with any questions, adverse effects, or acute worsening in signs and symptoms.  Discussed potential benefits, risk, and side effects of benzodiazepines to include potential risk of tolerance and dependence, as well as possible drowsiness.  Advised patient not to drive if experiencing drowsiness and to take lowest possible effective dose to minimize risk of dependence and tolerance.  Discussed potential metabolic side effects associated with atypical antipsychotics, as well as potential risk for movement side effects. Advised pt to contact office if movement side effects occur.    Diagnoses and all orders for this visit:  Generalized anxiety disorder -     ARIPiprazole (ABILIFY) 2 MG tablet; Take 1 tablet (2 mg total) by mouth daily.  Severe episode of recurrent major depressive disorder, without psychotic features ([place]) -     ARIPiprazole (ABILIFY) 2 MG tablet; Take 1 tablet (2 mg total) by mouth daily.  Obsessive-compulsive disorder, unspecified type  Insomnia, unspecified type -     ARIPiprazole (ABILIFY) 2 MG tablet;  Take 1 tablet (2 mg total) by mouth daily.    Please see After Visit Summary for patient specific instructions.  No future appointments.  No orders of the defined types were placed in this encounter.      -------------------------------

## 2019-04-14 ENCOUNTER — Other Ambulatory Visit: Payer: Self-pay | Admitting: Neurosurgery

## 2019-04-16 ENCOUNTER — Telehealth: Payer: Self-pay | Admitting: Adult Health

## 2019-04-16 NOTE — Telephone Encounter (Signed)
Patient called and said that she was reading paperwork on the abilfy and it says that you can't take that medicine with any pain medicine. She is taking pain medicine for her back and will be having back surgery in November and she doesn't want to get on a medicine that she will have to stop. She has only taken three doses of her medicine. Is there is there any thing similar to that medicine that she can take with pain medicine.

## 2019-04-19 NOTE — Telephone Encounter (Signed)
Ask if they think Latuda would be ok.

## 2019-04-19 NOTE — Telephone Encounter (Signed)
I think we should move forward with the low dose Abilify

## 2019-04-20 DIAGNOSIS — F3181 Bipolar II disorder: Secondary | ICD-10-CM | POA: Diagnosis not present

## 2019-04-29 DIAGNOSIS — F3181 Bipolar II disorder: Secondary | ICD-10-CM | POA: Diagnosis not present

## 2019-05-03 NOTE — Pre-Procedure Instructions (Signed)
[facility] [address] [place] [address] Phone: [phone] Fax: [phone]    Your procedure is scheduled on Fri., Nov. 6, 2020 from 10:00AM-12:51PM  Report to [facility] Entrance "A" at 8:00AM  Call this number if you have problems the morning of surgery:  [phone]   Remember:  Do not eat or drink after midnight on Nov. 5th    Take these medicines the morning of surgery with A SIP OF WATER: ARIPiprazole (ABILIFY) Citalopram (CELEXA)   If Needed: LORazepam (ATIVAN)   As of today, stop taking all Aspirin (unless instructed by your doctor) and Other Aspirin containing products, Vitamins, Fish oils, and Herbal medications. Also stop all NSAIDS i.e. Advil, Ibuprofen, Motrin, Aleve, Anaprox, Naproxen, BC, Goody Powders, and all Supplements.  No Smoking of any kind, Tobacco, or Alcohol products 24 hours prior to your procedure. If you use a Cpap at night, you may bring all equipment for your overnight stay.   Special instructions:  [place]- Preparing For Surgery  Before surgery, you can play an important role. Because skin is not sterile, your skin needs to be as free of germs as possible. You can reduce the number of germs on your skin by washing with CHG (chlorahexidine gluconate) Soap before surgery.  CHG is an antiseptic cleaner which kills germs and bonds with the skin to continue killing germs even after washing.    Please do not use if you have an allergy to CHG or antibacterial soaps. If your skin becomes reddened/irritated stop using the CHG.  Do not shave (including legs and underarms) for at least 48 hours prior to first CHG shower. It is OK to shave your face.  Please follow these instructions carefully.   1. Shower the NIGHT BEFORE SURGERY and the MORNING OF SURGERY with CHG.   2. If you chose to wash your hair, wash your hair first as usual with your normal shampoo.  3. After you  shampoo, rinse your hair and body thoroughly to remove the shampoo.  4. Use CHG as you would any other liquid soap. You can apply CHG directly to the skin and wash gently with a scrungie or a clean washcloth.   5. Apply the CHG Soap to your body ONLY FROM THE NECK DOWN.  Do not use on open wounds or open sores. Avoid contact with your eyes, ears, mouth and genitals (private parts). Wash Face and genitals (private parts)  with your normal soap.  6. Wash thoroughly, paying special attention to the area where your surgery will be performed.  7. Thoroughly rinse your body with warm water from the neck down.  8. DO NOT shower/wash with your normal soap after using and rinsing off the CHG Soap.  9. Pat yourself dry with a CLEAN TOWEL.  10. Wear CLEAN PAJAMAS to bed the night before surgery, wear comfortable clothes the morning of surgery  11. Place CLEAN SHEETS on your bed the night of your first shower and DO NOT SLEEP WITH PETS.   Day of Surgery:             Remember to brush your teeth WITH YOUR REGULAR TOOTHPASTE.  Do not wear jewelry, make-up or nail polish.  Do not wear lotions, powders, or perfumes, or deodorant.  Do not shave 48 hours prior to surgery.    Do not bring valuables to the hospital.  [facility] is not responsible for any belongings or  valuables.  Contacts, dentures or bridgework may not be worn into surgery.   For patients admitted to the hospital, discharge time will be determined by your treatment team.  Patients discharged the day of surgery will not be allowed to drive home, and someone age 41 and over needs to be with them for 24 hours.  Please wear clean clothes to the hospital/surgery center.    Please read over the following fact sheets that you were given.

## 2019-05-04 ENCOUNTER — Encounter (HOSPITAL_COMMUNITY): Payer: Self-pay

## 2019-05-04 ENCOUNTER — Other Ambulatory Visit: Payer: Self-pay

## 2019-05-04 ENCOUNTER — Other Ambulatory Visit (HOSPITAL_COMMUNITY)
Admission: RE | Admit: 2019-05-04 | Discharge: 2019-05-04 | Disposition: A | Discharge status: Home / Self Care | Payer: BC Managed Care – PPO | Source: Ambulatory Visit | Attending: Neurosurgery | Admitting: Neurosurgery

## 2019-05-04 ENCOUNTER — Encounter (HOSPITAL_COMMUNITY)
Admission: RE | Admit: 2019-05-04 | Discharge: 2019-05-04 | Disposition: A | Discharge status: Home / Self Care | Payer: BC Managed Care – PPO | Source: Ambulatory Visit | Attending: Neurosurgery | Admitting: Neurosurgery

## 2019-05-04 DIAGNOSIS — Z01812 Encounter for preprocedural laboratory examination: Secondary | ICD-10-CM | POA: Diagnosis not present

## 2019-05-04 HISTORY — DX: Unspecified osteoarthritis, unspecified site: M19.90

## 2019-05-04 LAB — SURGICAL PCR SCREEN
MRSA, PCR: NEGATIVE
Staphylococcus aureus: NEGATIVE

## 2019-05-04 LAB — CBC
HCT: 38.3 % (ref 36.0–46.0)
Hemoglobin: 12.1 g/dL (ref 12.0–15.0)
MCH: 27.5 pg (ref 26.0–34.0)
MCHC: 31.6 g/dL (ref 30.0–36.0)
MCV: 87 fL (ref 80.0–100.0)
Platelets: 299 10*3/uL (ref 150–400)
RBC: 4.4 MIL/uL (ref 3.87–5.11)
RDW: 12.8 % (ref 11.5–15.5)
WBC: 6.8 10*3/uL (ref 4.0–10.5)
nRBC: 0 % (ref 0.0–0.2)

## 2019-05-04 LAB — TYPE AND SCREEN
ABO/RH(D): O POS
Antibody Screen: NEGATIVE

## 2019-05-04 NOTE — Progress Notes (Signed)
PCP - denies Cardiologist - denies  PPM/ICD - denies Device Orders - N/A Rep Notified - N/A  Chest x-ray - N/A EKG - N/A Stress Test - denies ECHO - denies Cardiac Cath - denies  Sleep Study - denies CPAP - N/A  Blood Thinner Instructions: N/A Aspirin Instructions: N/A  ERAS Protcol - N/A PRE-SURGERY Ensure or G2-   COVID TEST- Test scheduled today after PAT appointment 05/04/2019. Patient verbalized understanding of self-quarantine instructions, appointment time, and place.  Anesthesia review: No  Patient denies shortness of breath, fever, cough and chest pain at PAT appointment  All instructions explained to the patient, with a verbal understanding of the material. Patient agrees to go over the instructions while at home for a better understanding. Patient also instructed to self quarantine after being tested for COVID-19. The opportunity to ask questions was provided.

## 2019-05-04 NOTE — H&P (Signed)
Patient ID:   [id number] Patient: Rebecca Holt  Date of Birth: 1985-12-12 Visit Type: Office Visit   Date: 03/25/2019 09:00 AM Provider: Marchia Meiers. Vertell Limber MD   This 33 year old female presents for Low back pain.  HISTORY OF PRESENT ILLNESS: 1.  Low back pain  Rebecca Holt, 33 year old female, visits for evaluation of low back and bilateral lower extremity pain.  Patient reports low back pain with occasional pain into either leg "to my toes".  She recalls no injury.  She has followed with ortho and TLIF was planned for today by Dr. Lynann Bologna; but canceled by insurance last week.   Physical therapy offered no relief  ESI x2 offered no relief Facet injection caused increased leg pain bilaterally  Ibuprofen offered no relief Robaxin offered no relief  Mobic offered no relief Hydrocodone or oxycodone is taken 1 tab at bedtime typically  History:  Depression, anxiety Surgical history:  Tonsillectomy June 2020  X-ray and MRI on CD for review  The patient is complaining of low back pain and pain into both of her legs and toes.  She says she has no improvement with physical therapy for 6 weeks.  She says that a TLIF surgery has been recommended to her by Dr. Lynann Bologna, and that this had in fact been scheduled but then the insurance company denied authorization for it.  The patient comes in today and is exhausted, exaggerated and tearful and extremely frustrated.  She says that she has lots of pain and that she is hurting into both of her legs and in her pelvis.  She says she cannot stand the pain.  She has had epidural steroid injection without improvement and facet blocks only made her feel worse.  She says the pain is worse to the point where she is struggling get out of bed.  She says her low back bothers her more than her legs.  She says that she is on able to take care of her shoulder and is having to lean on her husband was having to miss work.  She says that every days she has at least a  5 to 6/10 in severity terms of her pain 1 that and it gets to 8 to 9/10 at least on a daily basis.  She says that he goes into both of her feet and into the small toes bilaterally.  She says she is unable to sit still and has to lay down frequently.  Patient has taken duexis, meloxicam, Robaxin, hydrocodone, oxycodone and she says that none of these have given her sustained relief.  Her lumbar MRI was reviewed and this shows marked disc degeneration at the L5-S1 level with Modic endplate changes, disc height loss, foraminal stenosis.       PAST MEDICAL/SURGICAL HISTORY:   (Detailed)   Disease/disorder Onset Date Management Date Comments   Tonsillectomy      PAST MEDICAL HISTORY, SURGICAL HISTORY, FAMILY HISTORY, SOCIAL HISTORY AND REVIEW OF SYSTEMS I have reviewed the patient's past medical, surgical, family and social history as well as the comprehensive review of systems as included on the [facility] history form dated 03/25/2019, which I have signed.  Family History:  (Detailed) Relationship Family Member Name Deceased Age at Death Condition Onset Age Cause of Death     Family history of Hypertension  N     Family history of Diabetes mellitus  N    Social History:  (Detailed) Tobacco use reviewed. Preferred language is [place].  Tobacco use status: Current non-smoker. Smoking status: Never smoker.  SMOKING STATUS Type Smoking Status Usage Per Day Years Used Total Pack Years  Never smoker     VAPING USE Screened for vaping? No  TOBACCO/VAPING EXPOSURE No passive vaping exposure. No passive smoke exposure.       MEDICATIONS: (added, continued or stopped this visit) Started Medication Directions Instruction Stopped  Celexa 40 mg tablet take 1 tablet by oral route  every day      ALLERGIES: Ingredient Reaction Medication Name Comment NO KNOWN ALLERGIES    No known  allergies.   REVIEW OF SYSTEMS  See scanned patient registration form, dated 03/25/2019, signed and dated on 03/28/2019  Review of Systems Details System Neg/Pos Details Constitutional Negative Chills, Fatigue, Fever, Malaise, Night sweats, Weight gain and Weight loss. ENMT Negative Ear drainage, Hearing loss, Nasal drainage, Otalgia, Sinus pressure and Sore throat. Eyes Negative Eye discharge, Eye pain and Vision changes. Respiratory Negative Chronic cough, Cough, Dyspnea, Known TB exposure and Wheezing. Cardio Negative Chest pain, Claudication, Edema and Irregular heartbeat/palpitations. GI Negative Abdominal pain, Blood in stool, Change in stool pattern, Constipation, Decreased appetite, Diarrhea, Heartburn, Nausea and Vomiting. GU Negative Dysuria, Hematuria, Polyuria (Genitourinary), Urinary frequency, Urinary incontinence and Urinary retention. Endocrine Negative Cold intolerance, Heat intolerance, Polydipsia and Polyphagia. Neuro Negative Dizziness, Extremity weakness, Gait disturbance, Headache, Memory impairment, Numbness in extremity, Seizures and Tremors. Psych Positive Anxiety, Depression. Integumentary Negative Brittle hair, Brittle nails, Change in shape/size of mole(s), Hair loss, Hirsutism, Hives, Pruritus, Rash and Skin lesion. MS Positive Back pain. Hema/Lymph Negative Easy bleeding, Easy bruising and Lymphadenopathy. Allergic/Immuno Negative Contact allergy, Environmental allergies, Food allergies and Seasonal allergies. Reproductive Negative Breast discharge, Breast lumps, Dysmenorrhea, Dyspareunia, History of abnormal PAP smear, Hot flashes, Irregular menses and Vaginal discharge.  PHYSICAL EXAM:  Vitals Date Temp F BP Pulse Ht In Wt Lb BMI BSA Pain Score 03/25/2019 96.8 119/72 98 67 199 31.17  4/10   PHYSICAL EXAM Details General Level of Distress: no acute distress Overall Appearance: normal  Head and  Face  Right Left  Fundoscopic Exam:  normal normal    Cardiovascular Cardiac: regular rate and rhythm without murmur  Right Left  Carotid Pulses: normal normal  Respiratory Lungs: clear to auscultation  Neurological Orientation: normal Recent and Remote Memory: normal Attention Span and Concentration:   normal Language: normal Fund of Knowledge: normal  Right Left Sensation: normal normal Upper Extremity Coordination: normal normal  Lower Extremity Coordination: normal normal  Musculoskeletal Gait and Station: normal  Right Left Upper Extremity Muscle Strength: normal normal Lower Extremity Muscle Strength: normal normal Upper Extremity Muscle Tone:  normal normal Lower Extremity Muscle Tone: normal normal   Motor Strength Upper and lower extremity motor strength was tested in the clinically pertinent muscles.     Deep Tendon Reflexes  Right Left Biceps: normal normal Triceps: normal normal Brachioradialis: normal normal Patellar: normal normal Achilles: normal normal  Sensory Sensation was tested at L1 to S1. Any abnormal findings will be noted below.  Right Left S1: decreased decreased  Cranial Nerves II. Optic Nerve/Visual Fields: normal III. Oculomotor: normal IV. Trochlear: normal V. Trigeminal: normal VI. Abducens: normal VII. Facial: normal VIII. Acoustic/Vestibular: normal IX. Glossopharyngeal: normal X. Vagus: normal XI. Spinal Accessory: normal XII. Hypoglossal: normal  Motor and other Tests Lhermittes: negative Rhomberg: negative Pronator drift: absent     Right Left Hoffman's: normal normal Clonus: normal normal Babinski: normal normal SLR: positive at 45 degrees positive at 45 degrees Patrick's (  Faber): negative negative Toe Walk: normal normal Toe Lift: normal normal Heel Walk: normal normal SI Joint: nontender nontender   Additional Findings:  Patient is bilateral sciatic notch discomfort to palpation.  She is able to  stand on her heels toes.  She is limited in forward bending is able to bend to the level her knees with upper extremities outstretched.    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  There are significant degenerative changes in the endplates and she has a chronic condition which is not improving despite protracted physical therapy, multiple uses of medications, multiple courses of injections.  PLAN: I have recommended the patient that she undergo anterior lumbar interbody fusion at L5-S1 level.  I discussed in detail with patient wire made this recommendation and that the TLIF procedure as a this will better tolerated and give her higher rate success with out disrupting her posterior spinal musculature.  We will try to submit this for authorization and hopefully they will allow this to proceed.   Assessment/Plan  # Detail Type Description  1. 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     2. Assessment Disc displacement, lumbar (M51.26).     3. Assessment Low back pain, unspecified back pain laterality, with sciatica presence unspecified (M54.5).     4. Assessment Radiculopathy, lumbosacral region (M54.17).     5. Assessment Lumbar foraminal stenosis (M48.061).       Pain Management Plan Pain Scale: 4/10. Method: Numeric Pain Intensity Scale. Location: lower back. Onset: 07/02/2004. Duration: varies. Quality: discomforting. Pain management follow-up plan of care: Patient is to use pain medication as directed.              Provider:  Marchia Meiers. Vertell Limber MD  03/28/2019 03:57 PM    Dictation edited by: Marchia Meiers. Vertell Limber    CC Providers: Erline Levine MD  [address] [place], [place] [address]               Electronically signed by Marchia Meiers. Vertell Limber MD on 03/28/2019 03:57 PM

## 2019-05-05 LAB — NOVEL CORONAVIRUS, NAA (HOSP ORDER, SEND-OUT TO REF LAB; TAT 18-24 HRS): SARS-CoV-2, NAA: NOT DETECTED

## 2019-05-06 NOTE — Anesthesia Preprocedure Evaluation (Addendum)
Anesthesia Evaluation  Patient identified by MRN, date of birth, ID band Patient awake    Reviewed: Allergy & Precautions, NPO status , Patient's Chart, lab work & pertinent test results  History of Anesthesia Complications Negative for: history of anesthetic complications  Airway Mallampati: II  TM Distance: >3 FB Neck ROM: Full    Dental  (+) Dental Advisory Given, Teeth Intact   Pulmonary neg pulmonary ROS,    Pulmonary exam normal        Cardiovascular negative cardio ROS Normal cardiovascular exam     Neuro/Psych PSYCHIATRIC DISORDERS Anxiety Depression Bipolar Disorder  OCD negative neurological ROS     GI/Hepatic negative GI ROS, Neg liver ROS,   Endo/Other  Hypothyroidism (no meds, claims issues resolved)  Obesity   Renal/GU negative Renal ROS     Musculoskeletal  (+) Arthritis ,   Abdominal   Peds  Hematology negative hematology ROS (+)   Anesthesia Other Findings   Reproductive/Obstetrics                            Anesthesia Physical Anesthesia Plan  ASA: II  Anesthesia Plan: General   Post-op Pain Management:    Induction: Intravenous  PONV Risk Score and Plan: 4 or greater and Treatment may vary due to age or medical condition, Ondansetron, Scopolamine patch - Pre-op, Dexamethasone and Midazolam  Airway Management Planned: Oral ETT  Additional Equipment: None  Intra-op Plan:   Post-operative Plan: Extubation in OR  Informed Consent: I have reviewed the patients History and Physical, chart, labs and discussed the procedure including the risks, benefits and alternatives for the proposed anesthesia with the patient or authorized representative who has indicated his/her understanding and acceptance.     Dental advisory given  Plan Discussed with: CRNA and Anesthesiologist  Anesthesia Plan Comments:        Anesthesia Quick Evaluation

## 2019-05-07 ENCOUNTER — Observation Stay (HOSPITAL_COMMUNITY)
Admission: RE | Admit: 2019-05-07 | Discharge: 2019-05-08 | Disposition: A | Discharge status: Home / Self Care | Payer: BC Managed Care – PPO | Source: Ambulatory Visit | Attending: Neurosurgery | Admitting: Neurosurgery

## 2019-05-07 ENCOUNTER — Other Ambulatory Visit: Payer: Self-pay

## 2019-05-07 ENCOUNTER — Encounter (HOSPITAL_COMMUNITY)
Admission: RE | Disposition: A | Discharge status: Home / Self Care | Payer: Self-pay | Source: Ambulatory Visit | Attending: Neurosurgery

## 2019-05-07 ENCOUNTER — Encounter (HOSPITAL_COMMUNITY): Payer: Self-pay

## 2019-05-07 ENCOUNTER — Ambulatory Visit (HOSPITAL_COMMUNITY): Payer: BC Managed Care – PPO | Admitting: Certified Registered"

## 2019-05-07 ENCOUNTER — Ambulatory Visit (HOSPITAL_COMMUNITY): Payer: BC Managed Care – PPO

## 2019-05-07 DIAGNOSIS — M4807 Spinal stenosis, lumbosacral region: Secondary | ICD-10-CM | POA: Diagnosis not present

## 2019-05-07 DIAGNOSIS — Z6830 Body mass index (BMI) 30.0-30.9, adult: Secondary | ICD-10-CM | POA: Insufficient documentation

## 2019-05-07 DIAGNOSIS — F329 Major depressive disorder, single episode, unspecified: Secondary | ICD-10-CM | POA: Diagnosis not present

## 2019-05-07 DIAGNOSIS — M5117 Intervertebral disc disorders with radiculopathy, lumbosacral region: Secondary | ICD-10-CM | POA: Diagnosis not present

## 2019-05-07 DIAGNOSIS — M549 Dorsalgia, unspecified: Secondary | ICD-10-CM | POA: Diagnosis not present

## 2019-05-07 DIAGNOSIS — E669 Obesity, unspecified: Secondary | ICD-10-CM | POA: Diagnosis not present

## 2019-05-07 DIAGNOSIS — Z419 Encounter for procedure for purposes other than remedying health state, unspecified: Secondary | ICD-10-CM

## 2019-05-07 DIAGNOSIS — M1990 Unspecified osteoarthritis, unspecified site: Secondary | ICD-10-CM | POA: Diagnosis not present

## 2019-05-07 DIAGNOSIS — M5126 Other intervertebral disc displacement, lumbar region: Secondary | ICD-10-CM | POA: Insufficient documentation

## 2019-05-07 DIAGNOSIS — M48061 Spinal stenosis, lumbar region without neurogenic claudication: Secondary | ICD-10-CM | POA: Diagnosis not present

## 2019-05-07 DIAGNOSIS — E039 Hypothyroidism, unspecified: Secondary | ICD-10-CM | POA: Diagnosis not present

## 2019-05-07 DIAGNOSIS — M5137 Other intervertebral disc degeneration, lumbosacral region: Secondary | ICD-10-CM | POA: Insufficient documentation

## 2019-05-07 DIAGNOSIS — M4326 Fusion of spine, lumbar region: Secondary | ICD-10-CM | POA: Diagnosis not present

## 2019-05-07 HISTORY — PX: TRANSFORAMINAL LUMBAR INTERBODY FUSION (TLIF) WITH PEDICLE SCREW FIXATION 1 LEVEL: SHX6141

## 2019-05-07 LAB — POCT PREGNANCY, URINE: Preg Test, Ur: NEGATIVE

## 2019-05-07 SURGERY — TRANSFORAMINAL LUMBAR INTERBODY FUSION (TLIF) WITH PEDICLE SCREW FIXATION 1 LEVEL
Anesthesia: General | Site: Spine Lumbar

## 2019-05-07 MED ORDER — FENTANYL CITRATE (PF) 250 MCG/5ML IJ SOLN
INTRAMUSCULAR | Status: AC
  Filled 2019-05-07: qty 5

## 2019-05-07 MED ORDER — TURMERIC 500 MG PO CAPS: 1950.0000 mg | ORAL_CAPSULE | Freq: Every day | ORAL | Status: DC

## 2019-05-07 MED ORDER — OXYCODONE HCL 5 MG PO TABS
ORAL_TABLET | ORAL | Status: AC
  Filled 2019-05-07: qty 1

## 2019-05-07 MED ORDER — KCL IN DEXTROSE-NACL 20-5-0.45 MEQ/L-%-% IV SOLN: INTRAVENOUS | Status: DC

## 2019-05-07 MED ORDER — METHOCARBAMOL 500 MG PO TABS
500.0000 mg | ORAL_TABLET | Freq: Four times a day (QID) | ORAL | Status: DC | PRN
  Administered 2019-05-07 – 2019-05-08 (×4): 500 mg via ORAL
  Filled 2019-05-07 (×3): qty 1

## 2019-05-07 MED ORDER — MIDAZOLAM HCL 5 MG/5ML IJ SOLN
INTRAMUSCULAR | Status: DC | PRN
  Administered 2019-05-07: 2 mg via INTRAVENOUS

## 2019-05-07 MED ORDER — FENTANYL CITRATE (PF) 100 MCG/2ML IJ SOLN
INTRAMUSCULAR | Status: DC | PRN
  Administered 2019-05-07: 100 ug via INTRAVENOUS
  Administered 2019-05-07 (×3): 50 ug via INTRAVENOUS

## 2019-05-07 MED ORDER — FLEET ENEMA 7-19 GM/118ML RE ENEM: 1.0000 | ENEMA | Freq: Once | RECTAL | Status: DC | PRN

## 2019-05-07 MED ORDER — MENTHOL 3 MG MT LOZG: 1.0000 | LOZENGE | OROMUCOSAL | Status: DC | PRN

## 2019-05-07 MED ORDER — LORAZEPAM 0.5 MG PO TABS: 0.5000 mg | ORAL_TABLET | Freq: Every day | ORAL | Status: DC | PRN

## 2019-05-07 MED ORDER — LIDOCAINE 2% (20 MG/ML) 5 ML SYRINGE
INTRAMUSCULAR | Status: AC
  Filled 2019-05-07: qty 5

## 2019-05-07 MED ORDER — ZOLPIDEM TARTRATE 5 MG PO TABS: 5.0000 mg | ORAL_TABLET | Freq: Every evening | ORAL | Status: DC | PRN

## 2019-05-07 MED ORDER — PANTOPRAZOLE SODIUM 40 MG IV SOLR
40.0000 mg | Freq: Every day | INTRAVENOUS | Status: DC
  Administered 2019-05-07: 40 mg via INTRAVENOUS

## 2019-05-07 MED ORDER — ACETAMINOPHEN 650 MG RE SUPP: 650.0000 mg | RECTAL | Status: DC | PRN

## 2019-05-07 MED ORDER — THROMBIN 20000 UNITS EX SOLR
CUTANEOUS | Status: DC | PRN
  Administered 2019-05-07: 11:00:00 via TOPICAL

## 2019-05-07 MED ORDER — CHLORHEXIDINE GLUCONATE CLOTH 2 % EX PADS: 6.0000 | MEDICATED_PAD | Freq: Once | CUTANEOUS | Status: DC

## 2019-05-07 MED ORDER — LIDOCAINE 2% (20 MG/ML) 5 ML SYRINGE
INTRAMUSCULAR | Status: DC | PRN
  Administered 2019-05-07: 80 mg via INTRAVENOUS

## 2019-05-07 MED ORDER — PROPOFOL 10 MG/ML IV BOLUS
INTRAVENOUS | Status: AC
  Filled 2019-05-07: qty 20

## 2019-05-07 MED ORDER — OXYCODONE HCL 5 MG PO TABS: 5.0000 mg | ORAL_TABLET | ORAL | Status: DC | PRN

## 2019-05-07 MED ORDER — ROCURONIUM BROMIDE 50 MG/5ML IV SOSY
PREFILLED_SYRINGE | INTRAVENOUS | Status: DC | PRN
  Administered 2019-05-07: 70 mg via INTRAVENOUS
  Administered 2019-05-07: 30 mg via INTRAVENOUS

## 2019-05-07 MED ORDER — HYDROCODONE-ACETAMINOPHEN 5-325 MG PO TABS: 2.0000 | ORAL_TABLET | ORAL | Status: DC | PRN

## 2019-05-07 MED ORDER — ARIPIPRAZOLE 2 MG PO TABS
2.0000 mg | ORAL_TABLET | Freq: Every day | ORAL | Status: DC
  Administered 2019-05-08: 2 mg via ORAL
  Filled 2019-05-07: qty 1

## 2019-05-07 MED ORDER — METHOCARBAMOL 500 MG PO TABS
ORAL_TABLET | ORAL | Status: AC
  Filled 2019-05-07: qty 1

## 2019-05-07 MED ORDER — ALUM & MAG HYDROXIDE-SIMETH 200-200-20 MG/5ML PO SUSP: 30.0000 mL | Freq: Four times a day (QID) | ORAL | Status: DC | PRN

## 2019-05-07 MED ORDER — PROMETHAZINE HCL 25 MG/ML IJ SOLN: 6.2500 mg | INTRAMUSCULAR | Status: DC | PRN

## 2019-05-07 MED ORDER — CEFAZOLIN SODIUM-DEXTROSE 2-4 GM/100ML-% IV SOLN
2.0000 g | Freq: Three times a day (TID) | INTRAVENOUS | Status: AC
  Administered 2019-05-07 (×2): 2 g via INTRAVENOUS
  Filled 2019-05-07 (×2): qty 100

## 2019-05-07 MED ORDER — EPHEDRINE 5 MG/ML INJ
INTRAVENOUS | Status: AC
  Filled 2019-05-07: qty 10

## 2019-05-07 MED ORDER — SODIUM CHLORIDE 0.9% FLUSH: 3.0000 mL | INTRAVENOUS | Status: DC | PRN

## 2019-05-07 MED ORDER — OXYCODONE HCL 5 MG PO TABS
5.0000 mg | ORAL_TABLET | ORAL | Status: DC | PRN
  Administered 2019-05-07 – 2019-05-08 (×6): 10 mg via ORAL
  Filled 2019-05-07 (×6): qty 2

## 2019-05-07 MED ORDER — BUPIVACAINE LIPOSOME 1.3 % IJ SUSP
INTRAMUSCULAR | Status: DC | PRN
  Administered 2019-05-07: 20 mL

## 2019-05-07 MED ORDER — POLYETHYLENE GLYCOL 3350 17 G PO PACK: 17.0000 g | PACK | Freq: Every day | ORAL | Status: DC | PRN

## 2019-05-07 MED ORDER — ROCURONIUM BROMIDE 10 MG/ML (PF) SYRINGE
PREFILLED_SYRINGE | INTRAVENOUS | Status: AC
  Filled 2019-05-07: qty 10

## 2019-05-07 MED ORDER — ACETAMINOPHEN 325 MG PO TABS
650.0000 mg | ORAL_TABLET | ORAL | Status: DC | PRN
  Administered 2019-05-07 – 2019-05-08 (×3): 650 mg via ORAL
  Filled 2019-05-07 (×3): qty 2

## 2019-05-07 MED ORDER — CITALOPRAM HYDROBROMIDE 20 MG PO TABS
40.0000 mg | ORAL_TABLET | Freq: Every day | ORAL | Status: DC
  Administered 2019-05-08: 09:00:00 40 mg via ORAL
  Filled 2019-05-07: qty 2

## 2019-05-07 MED ORDER — THROMBIN 5000 UNITS EX SOLR
OROMUCOSAL | Status: DC | PRN
  Administered 2019-05-07: 12:00:00 via TOPICAL

## 2019-05-07 MED ORDER — FENTANYL CITRATE (PF) 100 MCG/2ML IJ SOLN
INTRAMUSCULAR | Status: AC
  Filled 2019-05-07: qty 2

## 2019-05-07 MED ORDER — MORPHINE SULFATE (PF) 2 MG/ML IV SOLN
2.0000 mg | INTRAVENOUS | Status: DC | PRN
  Administered 2019-05-07 (×2): 2 mg via INTRAVENOUS
  Filled 2019-05-07 (×2): qty 1

## 2019-05-07 MED ORDER — BUPIVACAINE HCL (PF) 0.5 % IJ SOLN
INTRAMUSCULAR | Status: AC
  Filled 2019-05-07: qty 30

## 2019-05-07 MED ORDER — METHOCARBAMOL 1000 MG/10ML IJ SOLN
500.0000 mg | Freq: Four times a day (QID) | INTRAVENOUS | Status: DC | PRN
  Filled 2019-05-07: qty 5

## 2019-05-07 MED ORDER — SODIUM CHLORIDE 0.9 % IV SOLN: 250.0000 mL | INTRAVENOUS | Status: DC

## 2019-05-07 MED ORDER — LIDOCAINE-EPINEPHRINE 1 %-1:100000 IJ SOLN
INTRAMUSCULAR | Status: AC
  Filled 2019-05-07: qty 1

## 2019-05-07 MED ORDER — LACTATED RINGERS IV SOLN
INTRAVENOUS | Status: DC
  Administered 2019-05-07 (×2): via INTRAVENOUS

## 2019-05-07 MED ORDER — HYDROXYZINE HCL 50 MG/ML IM SOLN
50.0000 mg | Freq: Four times a day (QID) | INTRAMUSCULAR | Status: DC | PRN
  Administered 2019-05-07: 50 mg via INTRAMUSCULAR
  Filled 2019-05-07: qty 1

## 2019-05-07 MED ORDER — ONDANSETRON HCL 4 MG/2ML IJ SOLN: 4.0000 mg | Freq: Four times a day (QID) | INTRAMUSCULAR | Status: DC | PRN

## 2019-05-07 MED ORDER — DEXAMETHASONE SODIUM PHOSPHATE 10 MG/ML IJ SOLN
INTRAMUSCULAR | Status: DC | PRN
  Administered 2019-05-07: 10 mg via INTRAVENOUS

## 2019-05-07 MED ORDER — BISACODYL 10 MG RE SUPP: 10.0000 mg | Freq: Every day | RECTAL | Status: DC | PRN

## 2019-05-07 MED ORDER — ONDANSETRON HCL 4 MG/2ML IJ SOLN
INTRAMUSCULAR | Status: DC | PRN
  Administered 2019-05-07: 4 mg via INTRAVENOUS

## 2019-05-07 MED ORDER — DOCUSATE SODIUM 100 MG PO CAPS
100.0000 mg | ORAL_CAPSULE | Freq: Two times a day (BID) | ORAL | Status: DC
  Administered 2019-05-07 – 2019-05-08 (×3): 100 mg via ORAL
  Filled 2019-05-07 (×3): qty 1

## 2019-05-07 MED ORDER — SCOPOLAMINE 1 MG/3DAYS TD PT72
MEDICATED_PATCH | TRANSDERMAL | Status: AC
  Filled 2019-05-07: qty 1

## 2019-05-07 MED ORDER — BUPIVACAINE HCL (PF) 0.5 % IJ SOLN
INTRAMUSCULAR | Status: DC | PRN
  Administered 2019-05-07: 5 mL

## 2019-05-07 MED ORDER — SUGAMMADEX SODIUM 200 MG/2ML IV SOLN
INTRAVENOUS | Status: DC | PRN
  Administered 2019-05-07: 180 mg via INTRAVENOUS

## 2019-05-07 MED ORDER — DEXAMETHASONE SODIUM PHOSPHATE 10 MG/ML IJ SOLN
INTRAMUSCULAR | Status: AC
  Filled 2019-05-07: qty 1

## 2019-05-07 MED ORDER — BUPIVACAINE LIPOSOME 1.3 % IJ SUSP
20.0000 mL | INTRAMUSCULAR | Status: DC
  Filled 2019-05-07: qty 20

## 2019-05-07 MED ORDER — OXYCODONE HCL 5 MG/5ML PO SOLN: 5.0000 mg | Freq: Once | ORAL | Status: AC | PRN

## 2019-05-07 MED ORDER — PHENOL 1.4 % MT LIQD: 1.0000 | OROMUCOSAL | Status: DC | PRN

## 2019-05-07 MED ORDER — THROMBIN 20000 UNITS EX SOLR
CUTANEOUS | Status: AC
  Filled 2019-05-07: qty 20000

## 2019-05-07 MED ORDER — SODIUM CHLORIDE 0.9% FLUSH: 3.0000 mL | Freq: Two times a day (BID) | INTRAVENOUS | Status: DC

## 2019-05-07 MED ORDER — VITAMIN D 25 MCG (1000 UNIT) PO TABS
5000.0000 [IU] | ORAL_TABLET | Freq: Every day | ORAL | Status: DC
  Administered 2019-05-07: 5000 [IU] via ORAL
  Filled 2019-05-07 (×3): qty 5

## 2019-05-07 MED ORDER — OXYCODONE HCL 5 MG PO TABS
5.0000 mg | ORAL_TABLET | Freq: Once | ORAL | Status: AC | PRN
  Administered 2019-05-07: 5 mg via ORAL

## 2019-05-07 MED ORDER — LYSINE 1000 MG PO TABS: 1000.0000 mg | ORAL_TABLET | Freq: Every day | ORAL | Status: DC

## 2019-05-07 MED ORDER — LIDOCAINE-EPINEPHRINE 1 %-1:100000 IJ SOLN
INTRAMUSCULAR | Status: DC | PRN
  Administered 2019-05-07: 5 mL

## 2019-05-07 MED ORDER — ONDANSETRON HCL 4 MG/2ML IJ SOLN
INTRAMUSCULAR | Status: AC
  Filled 2019-05-07: qty 2

## 2019-05-07 MED ORDER — MIDAZOLAM HCL 2 MG/2ML IJ SOLN
INTRAMUSCULAR | Status: AC
  Filled 2019-05-07: qty 2

## 2019-05-07 MED ORDER — THROMBIN 5000 UNITS EX SOLR
CUTANEOUS | Status: AC
  Filled 2019-05-07: qty 5000

## 2019-05-07 MED ORDER — B COMPLEX-C PO TABS
1.0000 | ORAL_TABLET | Freq: Every day | ORAL | Status: DC
  Administered 2019-05-07 – 2019-05-08 (×2): 1 via ORAL
  Filled 2019-05-07 (×2): qty 1

## 2019-05-07 MED ORDER — CEFAZOLIN SODIUM-DEXTROSE 2-4 GM/100ML-% IV SOLN
2.0000 g | INTRAVENOUS | Status: AC
  Administered 2019-05-07: 2 g via INTRAVENOUS

## 2019-05-07 MED ORDER — FENTANYL CITRATE (PF) 100 MCG/2ML IJ SOLN
25.0000 ug | INTRAMUSCULAR | Status: DC | PRN
  Administered 2019-05-07 (×4): 25 ug via INTRAVENOUS

## 2019-05-07 MED ORDER — PROPOFOL 10 MG/ML IV BOLUS
INTRAVENOUS | Status: DC | PRN
  Administered 2019-05-07: 200 mg via INTRAVENOUS

## 2019-05-07 MED ORDER — 0.9 % SODIUM CHLORIDE (POUR BTL) OPTIME
TOPICAL | Status: DC | PRN
  Administered 2019-05-07: 11:00:00 1000 mL

## 2019-05-07 MED ORDER — ONDANSETRON HCL 4 MG PO TABS
4.0000 mg | ORAL_TABLET | Freq: Four times a day (QID) | ORAL | Status: DC | PRN
  Administered 2019-05-07: 4 mg via ORAL
  Filled 2019-05-07: qty 1

## 2019-05-07 SURGICAL SUPPLY — 76 items
ADH SKN CLS APL DERMABOND .7 (GAUZE/BANDAGES/DRESSINGS) ×1
BASKET BONE COLLECTION (BASKET) ×3 IMPLANT
BONE CANC CHIPS 20CC PCAN1/4 (Bone Implant) ×3 IMPLANT
BUR MATCHSTICK NEURO 3.0 LAGG (BURR) ×3 IMPLANT
BUR PRECISION FLUTE 5.0 (BURR) ×3 IMPLANT
CANISTER SUCT 3000ML PPV (MISCELLANEOUS) ×3 IMPLANT
CARTRIDGE OIL MAESTRO DRILL (MISCELLANEOUS) ×1 IMPLANT
CHIPS CANC BONE 20CC PCAN1/4 (Bone Implant) ×1 IMPLANT
CONT SPEC 4OZ CLIKSEAL STRL BL (MISCELLANEOUS) ×3 IMPLANT
COVER BACK TABLE 24X17X13 BIG (DRAPES) IMPLANT
COVER BACK TABLE 60X90IN (DRAPES) ×3 IMPLANT
DERMABOND ADVANCED (GAUZE/BANDAGES/DRESSINGS) ×2
DERMABOND ADVANCED .7 DNX12 (GAUZE/BANDAGES/DRESSINGS) ×1 IMPLANT
DIFFUSER DRILL AIR PNEUMATIC (MISCELLANEOUS) ×3 IMPLANT
DRAPE C-ARM 42X72 X-RAY (DRAPES) ×3 IMPLANT
DRAPE C-ARMOR (DRAPES) ×3 IMPLANT
DRAPE LAPAROTOMY 100X72X124 (DRAPES) ×3 IMPLANT
DRAPE POUCH INSTRU U-SHP 10X18 (DRAPES) ×3 IMPLANT
DRAPE SURG 17X23 STRL (DRAPES) ×3 IMPLANT
DRSG OPSITE POSTOP 4X6 (GAUZE/BANDAGES/DRESSINGS) ×2 IMPLANT
DURAPREP 26ML APPLICATOR (WOUND CARE) ×3 IMPLANT
ELECT REM PT RETURN 9FT ADLT (ELECTROSURGICAL) ×3
ELECTRODE REM PT RTRN 9FT ADLT (ELECTROSURGICAL) ×1 IMPLANT
GAUZE 4X4 16PLY RFD (DISPOSABLE) IMPLANT
GAUZE SPONGE 4X4 12PLY STRL (GAUZE/BANDAGES/DRESSINGS) ×3 IMPLANT
GLOVE BIOGEL PI IND STRL 7.0 (GLOVE) IMPLANT
GLOVE BIOGEL PI IND STRL 7.5 (GLOVE) IMPLANT
GLOVE BIOGEL PI IND STRL 8 (GLOVE) ×2 IMPLANT
GLOVE BIOGEL PI IND STRL 8.5 (GLOVE) ×2 IMPLANT
GLOVE BIOGEL PI INDICATOR 7.0 (GLOVE) ×4
GLOVE BIOGEL PI INDICATOR 7.5 (GLOVE) ×4
GLOVE BIOGEL PI INDICATOR 8 (GLOVE) ×4
GLOVE BIOGEL PI INDICATOR 8.5 (GLOVE) ×4
GLOVE SS N UNI LF 6.5 STRL (GLOVE) ×2 IMPLANT
GLOVE SS N UNI LF 7.0 STRL (GLOVE) ×4 IMPLANT
GLOVE SS N UNI LF 7.5 STRL (GLOVE) ×4 IMPLANT
GLOVE SS N UNI LF 8.0 STRL (GLOVE) ×4 IMPLANT
GOWN STRL REUS W/ TWL LRG LVL3 (GOWN DISPOSABLE) IMPLANT
GOWN STRL REUS W/ TWL XL LVL3 (GOWN DISPOSABLE) ×2 IMPLANT
GOWN STRL REUS W/TWL 2XL LVL3 (GOWN DISPOSABLE) ×8 IMPLANT
GOWN STRL REUS W/TWL LRG LVL3 (GOWN DISPOSABLE) ×6
GOWN STRL REUS W/TWL XL LVL3 (GOWN DISPOSABLE) ×9
GRAFT BNE CANC CHIPS 1-8 20CC (Bone Implant) IMPLANT
IMPL TLX20 8X11X26 20D (Cage) IMPLANT
KIT BASIN OR (CUSTOM PROCEDURE TRAY) ×3 IMPLANT
KIT INFUSE XX SMALL 0.7CC (Orthopedic Implant) ×2 IMPLANT
KIT POSITION SURG JACKSON T1 (MISCELLANEOUS) ×3 IMPLANT
KIT TURNOVER KIT B (KITS) ×3 IMPLANT
NDL HYPO 25X1 1.5 SAFETY (NEEDLE) ×1 IMPLANT
NDL SPNL 18GX3.5 QUINCKE PK (NEEDLE) IMPLANT
NEEDLE HYPO 25X1 1.5 SAFETY (NEEDLE) ×3 IMPLANT
NEEDLE SPNL 18GX3.5 QUINCKE PK (NEEDLE) IMPLANT
NS IRRIG 1000ML POUR BTL (IV SOLUTION) ×3 IMPLANT
OIL CARTRIDGE MAESTRO DRILL (MISCELLANEOUS) ×3
PACK LAMINECTOMY NEURO (CUSTOM PROCEDURE TRAY) ×3 IMPLANT
PAD ARMBOARD 7.5X6 YLW CONV (MISCELLANEOUS) ×9 IMPLANT
PATTIES SURGICAL .5 X.5 (GAUZE/BANDAGES/DRESSINGS) IMPLANT
PATTIES SURGICAL .5 X1 (DISPOSABLE) IMPLANT
PATTIES SURGICAL 1X1 (DISPOSABLE) IMPLANT
ROD RELINE-O LORD 5.5X40 (Rod) ×4 IMPLANT
SCREW LOCK RELINE 5.5 TULIP (Screw) ×8 IMPLANT
SCREW RELINE 6.5X35 POLYAXIAL (Screw) ×4 IMPLANT
SCREW RELINE-O POLY 6.5X40 (Screw) ×4 IMPLANT
SPONGE LAP 4X18 RFD (DISPOSABLE) IMPLANT
SPONGE SURGIFOAM ABS GEL 100 (HEMOSTASIS) ×3 IMPLANT
STAPLER SKIN PROX WIDE 3.9 (STAPLE) IMPLANT
SUT VIC AB 1 CT1 18XBRD ANBCTR (SUTURE) ×2 IMPLANT
SUT VIC AB 1 CT1 8-18 (SUTURE) ×6
SUT VIC AB 2-0 CT1 18 (SUTURE) ×6 IMPLANT
SUT VIC AB 3-0 SH 8-18 (SUTURE) ×6 IMPLANT
SYR 3ML LL SCALE MARK (SYRINGE) ×6 IMPLANT
SYR 5ML LL (SYRINGE) IMPLANT
TLX20 IMPLANT 8X11X26 20D (Cage) ×3 IMPLANT
TOWEL GREEN STERILE (TOWEL DISPOSABLE) ×3 IMPLANT
TOWEL GREEN STERILE FF (TOWEL DISPOSABLE) ×3 IMPLANT
WATER STERILE IRR 1000ML POUR (IV SOLUTION) ×3 IMPLANT

## 2019-05-07 NOTE — Progress Notes (Signed)
Awake, alert, conversant.  Full strength both legs.  Patient is doing well.   

## 2019-05-07 NOTE — Transfer of Care (Signed)
Immediate Anesthesia Transfer of Care Note  Patient: Rebecca Holt  Procedure(s) Performed: Lumbar Five- Sacral One Transforaminal lumbar interbody Fusion (N/A Spine Lumbar)  Patient Location: PACU  Anesthesia Type:General  Level of Consciousness: drowsy and patient cooperative  Airway & Oxygen Therapy: Patient Spontanous Breathing  Post-op Assessment: Report given to RN and Post -op Vital signs reviewed and stable  Post vital signs: Reviewed and stable  Last Vitals:  Vitals Value Taken Time  BP 144/81 05/07/19 1258  Temp    Pulse 114 05/07/19 1300  Resp 17 05/07/19 1300  SpO2 96 % 05/07/19 1300  Vitals shown include unvalidated device data.  Last Pain:  Vitals:   05/07/19 0828  TempSrc:   PainSc: 5       Patients Stated Pain Goal: 2 ([id number])  Complications: No apparent anesthesia complications

## 2019-05-07 NOTE — Anesthesia Procedure Notes (Signed)
Procedure Name: Intubation Date/Time: 05/07/2019 10:02 AM Performed by: Lance Coon, CRNA Pre-anesthesia Checklist: Patient identified, Emergency Drugs available, Suction available, Patient being monitored and Timeout performed Patient Re-evaluated:Patient Re-evaluated prior to induction Oxygen Delivery Method: Circle system utilized Preoxygenation: Pre-oxygenation with 100% oxygen Induction Type: IV induction Ventilation: Mask ventilation without difficulty Laryngoscope Size: Miller and 2 Grade View: Grade I Tube type: Oral Tube size: 7.0 mm Number of attempts: 1 Airway Equipment and Method: Stylet Placement Confirmation: ETT inserted through vocal cords under direct vision,  positive ETCO2 and breath sounds checked- equal and bilateral Secured at: 21 cm Tube secured with: Tape Dental Injury: Teeth and Oropharynx as per pre-operative assessment

## 2019-05-07 NOTE — Evaluation (Signed)
Physical Therapy Evaluation Patient Details Name: Rebecca Holt MRN: [id number] DOB: [birth year] Today's Date: 05/07/2019   History of Present Illness  Pt admit for Lumbar Five- Sacral One Transforaminal lumbar interbody Fusion (N/A) - Lumbar 5 Sacral 1 Transforaminal lumbar interbody fusion with pedicle screw fixation and posterolateral arthrodesis.   Clinical Impression  Pt admitted with above diagnosis. Pt was able to ambulate with RW with min guard assist. Pt and hsuband educated regarding log roll, back precautions, and general mobility to include use of RW.  Pt did get dizzy and BP dropped but resolved quickly.  Should progress well.   Pt currently with functional limitations due to the deficits listed below (see PT Problem List). Pt will benefit from skilled PT to increase their independence and safety with mobility to allow discharge to the venue listed below.      Follow Up Recommendations Home health PT;Supervision/Assistance - 24 hour    Equipment Recommendations  Rolling walker with 5" wheels;3in1 (PT)    Recommendations for Other Services       Precautions / Restrictions Precautions Precautions: Fall;Back Precaution Booklet Issued: Yes (comment) Required Braces or Orthoses: Spinal Brace Spinal Brace: Applied in sitting position Restrictions Weight Bearing Restrictions: No      Mobility  Bed Mobility Overal bed mobility: Needs Assistance Bed Mobility: Rolling;Sidelying to Sit Rolling: Supervision Sidelying to sit: Supervision       General bed mobility comments: cues for log roll technique - husband present and feels confident in assisting pt  Transfers Overall transfer level: Needs assistance Equipment used: Rolling walker (2 wheeled) Transfers: Sit to/from Stand Sit to Stand: Supervision         General transfer comment: cues for technique for hand placement  Ambulation/Gait Ambulation/Gait assistance: Min guard;+2 safety/equipment Gait Distance (Feet):  25 Feet Assistive device: Rolling walker (2 wheeled) Gait Pattern/deviations: Step-through pattern;Decreased stride length;Antalgic;Wide base of support   Gait velocity interpretation: <1.31 ft/sec, indicative of household ambulator General Gait Details: Pt with slow gait and guarded.  Pain in back limiting and then pt became nausesous and felt dizzy.  BP once back in chair was 99/54 and dizziness subsided after a ccool rag on forehead and pt sat for a bit.  left in chair and nurse aware.    Stairs            Wheelchair Mobility    Modified Rankin (Stroke Patients Only)       Balance Overall balance assessment: Needs assistance Sitting-balance support: No upper extremity supported;Feet supported Sitting balance-Leahy Scale: Fair     Standing balance support: Bilateral upper extremity supported;During functional activity Standing balance-Leahy Scale: Poor Standing balance comment: Pt relies on UEs upport                              Pertinent Vitals/Pain Pain Assessment: 0-10 Pain Score: 9  Pain Location: back Pain Descriptors / Indicators: Aching;Grimacing;Guarding Pain Intervention(s): Limited activity within patient's tolerance;Monitored during session;Repositioned;Premedicated before session    Home Living Family/patient expects to be discharged to:: Private residence Living Arrangements: Spouse/significant other;Children Available Help at Discharge: Family;Available 24 hours/day Type of Home: House Home Access: Stairs to enter Entrance Stairs-Rails: Right;Left;Can reach both Entrance Stairs-Number of Steps: 4 Home Layout: Multi-level Home Equipment: Grab bars - tub/shower      Prior Function Level of Independence: Independent               Hand Dominance  Extremity/Trunk Assessment   Upper Extremity Assessment Upper Extremity Assessment: Defer to OT evaluation    Lower Extremity Assessment Lower Extremity Assessment: Overall  WFL for tasks assessed    Cervical / Trunk Assessment Cervical / Trunk Assessment: Normal  Communication   Communication: No difficulties  Cognition Arousal/Alertness: Awake/alert Behavior During Therapy: WFL for tasks assessed/performed Overall Cognitive Status: Within Functional Limits for tasks assessed                                        General Comments General comments (skin integrity, edema, etc.): Pt donned brace with assist - pt and husband educated and husband feels comfortable. Also educated both in back precarutions and handout given.     Exercises     Assessment/Plan    PT Assessment Patient needs continued PT services  PT Problem List Decreased activity tolerance;Decreased balance;Decreased mobility;Decreased knowledge of use of DME;Decreased safety awareness;Cardiopulmonary status limiting activity;Decreased knowledge of precautions;Pain       PT Treatment Interventions DME instruction;Gait training;Therapeutic activities;Stair training;Functional mobility training;Therapeutic exercise;Balance training;Patient/family education    PT Goals (Current goals can be found in the Care Plan section)  Acute Rehab PT Goals Patient Stated Goal: to go home PT Goal Formulation: With patient Time For Goal Achievement: 05/21/19 Potential to Achieve Goals: Good    Frequency Min 6X/week   Barriers to discharge        Co-evaluation               AM-PAC PT "6 Clicks" Mobility  Outcome Measure Help needed turning from your back to your side while in a flat bed without using bedrails?: A Little Help needed moving from lying on your back to sitting on the side of a flat bed without using bedrails?: A Little Help needed moving to and from a bed to a chair (including a wheelchair)?: A Little Help needed standing up from a chair using your arms (e.g., wheelchair or bedside chair)?: A Little Help needed to walk in hospital room?: A Little Help needed  climbing 3-5 steps with a railing? : A Little 6 Click Score: 18    End of Session Equipment Utilized During Treatment: Gait belt;Back brace Activity Tolerance: Patient limited by fatigue;Patient limited by pain(limited by nauseous/dizziness) Patient left: in chair;with call bell/phone within reach;with family/visitor present Nurse Communication: Mobility status(request nausea meds) PT Visit Diagnosis: Unsteadiness on feet (R26.81);Muscle weakness (generalized) (M62.81);Pain Pain - part of body: (back)    Time: [id number] PT Time Calculation (min) (ACUTE ONLY): 34 min   Charges:   PT Evaluation $PT Eval Moderate Complexity: 1 Mod PT Treatments $Gait Training: 8-22 mins        Quindell Shere W,PT Acute Rehabilitation Services Pager:  [phone]  Office:  [place] 05/07/2019, 4:20 PM

## 2019-05-07 NOTE — Progress Notes (Signed)
PHARMACIST - PHYSICIAN ORDER COMMUNICATION  CONCERNING: P&T Medication Policy on Herbal Medications  DESCRIPTION:  This patient's orders for:  Lysine, tumeric  have been noted.  This product(s) is classified as an "herbal" or natural product. Due to a lack of definitive safety studies or FDA approval, nonstandard manufacturing practices, plus the potential risk of unknown drug-drug interactions while on inpatient medications, the Pharmacy and Therapeutics Committee does not permit the use of "herbal" or natural products of this type within [facility].   ACTION TAKEN: The pharmacy department is unable to verify this order at this time and your patient has been informed of this safety policy. Please reevaluate patient's clinical condition at discharge and address if the herbal or natural product(s) should be resumed at that time.  Quadasia Newsham A. Levada Dy, PharmD, BCPS, FNKF Clinical Pharmacist [place] Please utilize Amion for appropriate phone number to reach the unit pharmacist ([place])

## 2019-05-07 NOTE — Op Note (Signed)
05/07/2019  12:53 PM  PATIENT:  Rebecca Holt  33 y.o. female  PRE-OPERATIVE DIAGNOSIS:  Other intervertebral disc displacement, Lumbar region, lumbar foraminal stenosis, DDD, lumbago, radiculopathy L 5 S 1 level  POST-OPERATIVE DIAGNOSIS:   Other intervertebral disc displacement, Lumbar region, lumbar foraminal stenosis, DDD, lumbago, radiculopathy L 5 S 1 level  PROCEDURE:  Procedure(s) with comments: Lumbar Five- Sacral One Transforaminal lumbar interbody Fusion (N/A) - Lumbar 5 Sacral 1 Transforaminal lumbar interbody fusion with pedicle screw fixation and posterolateral arthrodesis  SURGEON:  Surgeon(s) and Role:    Erline Levine, MD - Primary    * Ashok Pall, MD - Assisting  PHYSICIAN ASSISTANT:   ASSISTANTS: Poteat, RN   ANESTHESIA:   general  EBL:  200 mL   BLOOD ADMINISTERED:none  DRAINS: none   LOCAL MEDICATIONS USED:  MARCAINE    and LIDOCAINE   SPECIMEN:  No Specimen  DISPOSITION OF SPECIMEN:  N/A  COUNTS:  YES  TOURNIQUET:  * No tourniquets in log *  DICTATION: Patient is 33 year old woman with lumbar foraminal stenosis, HNP, disc degeneration and bilateral leg pain. She has bilateral L 5 radiculopathies. It was elected to take her to surgery for decompression and fusion at the L 5 S 1 level.   Procedure: Patient was placed in a prone position on the Van table after smooth and uncomplicated induction of general endotracheal anesthesia. Her low back was prepped and draped in usual sterile fashion with betadine scrub and DuraPrep after preop localization with C arm. Area of incision was infiltrated with local lidocaine. Incision was made to the lumbodorsal fascia was incised and exposure was performed of the L 5 through S 1 spinous processes laminae facet joint and transverse processes. Intraoperative x-ray was obtained which confirmed correct orientation. A total hemi-laminectomy of L 5 was performed with disarticulation of the facet joints at this level and  thorough decompression was performed of both L 5  and S 1 nerve roots along with the common dural tube. This decompression was more involved than would be typical of that performed for PLIF alone and included painstaking dissection of adherent ligament compressing the thecal sac and wide decompression of all neural elements. A thorough discectomy was performed on the left with preparation of the endplates for grafting a trial spacer was placed this level. 10 cc of bone autograft was packed within the interspace on the left and an 8 x 11 x 26 mm 20 degree TLIF titanium expandable cage was placed and countersunk appropriately.   The posterolateral region was extensively decorticated and pedicle probes were placed at L 5 and S 1 bilaterally. Intraoperative fluoroscopy confirmed correct orientationin the AP and lateral plane. 40 x 6.5 mm pedicle screws were placed at L5 bilaterally and 35 x 6.5 mm screws placed at S 1 bilaterally final x-rays demonstrated well-positioned interbody grafts and pedicle screw fixation. A 40 mm lordotic rod was placed on the right and a 40 mm rod was placed on the left locked down in situ and the posterolateral region was packed with the remaining BMP and bone graft allograft was packed in the posterolateral region on the right. The wound was irrigated. Deep musculature was infiltrated with long-acting Marcaine.  Fascia was closed with 1 Vicryl sutures skin edges were reapproximated 2 and 3-0 Vicryl sutures. The wound is dressed with Dermabond and an occlusive dressing.   the patient was extubated in the operating room and taken to recovery in stable satisfactory condition. She tolerated the operation  well counts were correct at the end of the case.   PLAN OF CARE: Admit for overnight observation  PATIENT DISPOSITION:  PACU - hemodynamically stable.   Delay start of Pharmacological VTE agent (>24hrs) due to surgical blood loss or risk of bleeding: yes

## 2019-05-07 NOTE — Interval H&P Note (Signed)
History and Physical Interval Note:  05/07/2019 9:58 AM  Rebecca Holt  has presented today for surgery, with the diagnosis of Other intervertebral disc displacement, Lumbar region.  The various methods of treatment have been discussed with the patient and family. After consideration of risks, benefits and other options for treatment, the patient has consented to  Procedure(s) with comments: Lumbar 5 Sacral 1 Transforaminal lumbar interbody fusion (N/A) - Lumbar 5 Sacral 1 Transforaminal lumbar interbody fusion as a surgical intervention.  The patient's history has been reviewed, patient examined, no change in status, stable for surgery.  I have reviewed the patient's chart and labs.  Questions were answered to the patient's satisfaction.     Peggyann Shoals

## 2019-05-07 NOTE — Brief Op Note (Signed)
05/07/2019  12:53 PM  PATIENT:  Rebecca Holt  33 y.o. female  PRE-OPERATIVE DIAGNOSIS:  Other intervertebral disc displacement, Lumbar region, lumbar foraminal stenosis, DDD, lumbago, radiculopathy L 5 S 1 level  POST-OPERATIVE DIAGNOSIS:   Other intervertebral disc displacement, Lumbar region, lumbar foraminal stenosis, DDD, lumbago, radiculopathy L 5 S 1 level  PROCEDURE:  Procedure(s) with comments: Lumbar Five- Sacral One Transforaminal lumbar interbody Fusion (N/A) - Lumbar 5 Sacral 1 Transforaminal lumbar interbody fusion with pedicle screw fixation and posterolateral arthrodesis  SURGEON:  Surgeon(s) and Role:    Erline Levine, MD - Primary    * Ashok Pall, MD - Assisting  PHYSICIAN ASSISTANT:   ASSISTANTS: Poteat, RN   ANESTHESIA:   general  EBL:  200 mL   BLOOD ADMINISTERED:none  DRAINS: none   LOCAL MEDICATIONS USED:  MARCAINE    and LIDOCAINE   SPECIMEN:  No Specimen  DISPOSITION OF SPECIMEN:  N/A  COUNTS:  YES  TOURNIQUET:  * No tourniquets in log *  DICTATION: Patient is 33 year old woman with lumbar foraminal stenosis, HNP, disc degeneration and bilateral leg pain. She has bilateral L 5 radiculopathies. It was elected to take her to surgery for decompression and fusion at the L 5 S 1 level.   Procedure: Patient was placed in a prone position on the Haring table after smooth and uncomplicated induction of general endotracheal anesthesia. Her low back was prepped and draped in usual sterile fashion with betadine scrub and DuraPrep after preop localization with C arm. Area of incision was infiltrated with local lidocaine. Incision was made to the lumbodorsal fascia was incised and exposure was performed of the L 5 through S 1 spinous processes laminae facet joint and transverse processes. Intraoperative x-ray was obtained which confirmed correct orientation. A total hemi-laminectomy of L 5 was performed with disarticulation of the facet joints at this level and  thorough decompression was performed of both L 5  and S 1 nerve roots along with the common dural tube. This decompression was more involved than would be typical of that performed for PLIF alone and included painstaking dissection of adherent ligament compressing the thecal sac and wide decompression of all neural elements. A thorough discectomy was performed on the left with preparation of the endplates for grafting a trial spacer was placed this level. 10 cc of bone autograft was packed within the interspace on the left and an 8 x 11 x 26 mm 20 degree TLIF titanium expandable cage was placed and countersunk appropriately.   The posterolateral region was extensively decorticated and pedicle probes were placed at L 5 and S 1 bilaterally. Intraoperative fluoroscopy confirmed correct orientationin the AP and lateral plane. 40 x 6.5 mm pedicle screws were placed at L5 bilaterally and 35 x 6.5 mm screws placed at S 1 bilaterally final x-rays demonstrated well-positioned interbody grafts and pedicle screw fixation. A 40 mm lordotic rod was placed on the right and a 40 mm rod was placed on the left locked down in situ and the posterolateral region was packed with the remaining BMP and bone graft allograft was packed in the posterolateral region on the right. The wound was irrigated. Deep musculature was infiltrated with long-acting Marcaine.  Fascia was closed with 1 Vicryl sutures skin edges were reapproximated 2 and 3-0 Vicryl sutures. The wound is dressed with Dermabond and an occlusive dressing.   the patient was extubated in the operating room and taken to recovery in stable satisfactory condition. She tolerated the operation  well counts were correct at the end of the case.   PLAN OF CARE: Admit for overnight observation  PATIENT DISPOSITION:  PACU - hemodynamically stable.   Delay start of Pharmacological VTE agent (>24hrs) due to surgical blood loss or risk of bleeding: yes

## 2019-05-07 NOTE — Anesthesia Postprocedure Evaluation (Signed)
Anesthesia Post Note  Patient: Rebecca Holt  Procedure(s) Performed: Lumbar Five- Sacral One Transforaminal lumbar interbody Fusion (N/A Spine Lumbar)     Patient location during evaluation: PACU Anesthesia Type: General Level of consciousness: awake and alert Pain management: pain level controlled Vital Signs Assessment: post-procedure vital signs reviewed and stable Respiratory status: spontaneous breathing, nonlabored ventilation and respiratory function stable Cardiovascular status: blood pressure returned to baseline and stable Postop Assessment: no apparent nausea or vomiting Anesthetic complications: no    Last Vitals:  Vitals:   05/07/19 1330 05/07/19 1345  BP: 132/71 118/72  Pulse: 94 87  Resp: 18 20  Temp:  (!) 36.2 C  SpO2: 95% 96%    Last Pain:  Vitals:   05/07/19 1342  TempSrc:   PainSc: [place] Cord Wilczynski

## 2019-05-08 DIAGNOSIS — Z6830 Body mass index (BMI) 30.0-30.9, adult: Secondary | ICD-10-CM | POA: Diagnosis not present

## 2019-05-08 DIAGNOSIS — F329 Major depressive disorder, single episode, unspecified: Secondary | ICD-10-CM | POA: Diagnosis not present

## 2019-05-08 DIAGNOSIS — M5126 Other intervertebral disc displacement, lumbar region: Secondary | ICD-10-CM | POA: Diagnosis not present

## 2019-05-08 DIAGNOSIS — M48061 Spinal stenosis, lumbar region without neurogenic claudication: Secondary | ICD-10-CM | POA: Diagnosis not present

## 2019-05-08 DIAGNOSIS — M4807 Spinal stenosis, lumbosacral region: Secondary | ICD-10-CM | POA: Diagnosis not present

## 2019-05-08 DIAGNOSIS — M5117 Intervertebral disc disorders with radiculopathy, lumbosacral region: Secondary | ICD-10-CM | POA: Diagnosis not present

## 2019-05-08 DIAGNOSIS — E669 Obesity, unspecified: Secondary | ICD-10-CM | POA: Diagnosis not present

## 2019-05-08 DIAGNOSIS — M549 Dorsalgia, unspecified: Secondary | ICD-10-CM | POA: Diagnosis not present

## 2019-05-08 DIAGNOSIS — M5137 Other intervertebral disc degeneration, lumbosacral region: Secondary | ICD-10-CM | POA: Diagnosis not present

## 2019-05-08 NOTE — TOC Transition Note (Signed)
Transition of Care [facility]) - CM/SW Discharge Note   Patient Details  Name: Rebecca Holt MRN: [id number] Date of Birth: 07-30-1985  Transition of Care [facility]) CM/SW Contact:  Carles Collet, RN Phone Number: 05/08/2019, 9:26 AM   Clinical Narrative:   Referral placed to [facility] agencies, unable to take. Spoke w patient she states that she has been educated on precautions/ limitations/ and brace use by PT. Encouraged follow up w Dr Vertell Limber. Patient has DME in room to go home with.     Final next level of care: Home/Self Care Barriers to Discharge: No Barriers Identified   Patient Goals and CMS Choice        Discharge Placement                       Discharge Plan and Services                                     Social Determinants of Health (SDOH) Interventions     Readmission Risk Interventions No flowsheet data found.

## 2019-05-08 NOTE — Discharge Instructions (Signed)

## 2019-05-08 NOTE — Progress Notes (Signed)
Patient alert and oriented, mae's well, voiding adequate amount of urine, swallowing without difficulty, no c/o pain at time of discharge. Patient discharged home with family. Script and discharged instructions given to patient. Patient and family stated understanding of instructions given. Patient has an appointment with Dr.Stern    

## 2019-05-08 NOTE — Evaluation (Signed)
Occupational Therapy Evaluation Patient Details Name: Rebecca Holt MRN: [id number] DOB: 1986-03-30 Today's Date: 05/08/2019    History of Present Illness 33 yo female s/p L5-S1 fusion with pedicle screw fixation and posterolateral arthrodesis. PMH including anxiety, arthritis, bipolar disorder, depressive disorder, and hypothyroidism.   Clinical Impression   PTA, pt was living with her husband and three sons and was independent. Currently, pt requires Supervision for ADLs and functional mobility using RW. Provided education on bed mobility, back precautions, brace management, LB ADLs with AE, toileting, and tub transfer with 3N1; pt demonstrated understanding. Answered all pt questions. Recommend dc home once medically stable per physician. All acute OT needs met and will sign off. Thank you.     Follow Up Recommendations  No OT follow up    Equipment Recommendations  3 in 1 bedside commode    Recommendations for Other Services       Precautions / Restrictions Precautions Precautions: Fall;Back Precaution Booklet Issued: Yes (comment) Required Braces or Orthoses: Spinal Brace Spinal Brace: Applied in sitting position Restrictions Weight Bearing Restrictions: No      Mobility Bed Mobility Overal bed mobility: Needs Assistance Bed Mobility: Sidelying to Sit Rolling: Supervision Sidelying to sit: Supervision       General bed mobility comments: HOB slightly elevated. Pt was able to transition to full sitting position with supervision for safety. Increased time and effort due to pain.   Transfers Overall transfer level: Needs assistance Equipment used: Rolling walker (2 wheeled) Transfers: Sit to/from Stand Sit to Stand: Min guard         General transfer comment: Pt attempting with proper hand placement on seated surface for safety. x2 attempts without success due to pain, and pt pulled up from RW on the third attempt to full stand and min guard assist.     Balance  Overall balance assessment: Needs assistance Sitting-balance support: No upper extremity supported;Feet supported Sitting balance-Leahy Scale: Fair     Standing balance support: Bilateral upper extremity supported;During functional activity Standing balance-Leahy Scale: Poor Standing balance comment: Approaching fair. Light support on RW during gait training but ambulated around room without RW                           ADL either performed or assessed with clinical judgement   ADL Overall ADL's : Needs assistance/impaired                                       General ADL Comments: Pt performing ADLs and functional mobility at Supervision level. Provided education and handout on back precations, bed mobility, brace management, LB ADLs with AE, toileting, and tub transfer with 3N1. Pt demonstrated and verablized understanding     Vision Baseline Vision/History: Wears glasses Wears Glasses: At all times Patient Visual Report: No change from baseline       Perception     Praxis      Pertinent Vitals/Pain Pain Assessment: Faces Faces Pain Scale: Hurts even more Pain Location: back Pain Descriptors / Indicators: Aching;Grimacing;Guarding Pain Intervention(s): Limited activity within patient's tolerance;Monitored during session;Repositioned     Hand Dominance     Extremity/Trunk Assessment Upper Extremity Assessment Upper Extremity Assessment: Overall WFL for tasks assessed   Lower Extremity Assessment Lower Extremity Assessment: Defer to PT evaluation   Cervical / Trunk Assessment Cervical / Trunk Assessment: Other exceptions Cervical /  Trunk Exceptions: s/p back sx   Communication Communication Communication: No difficulties   Cognition Arousal/Alertness: Awake/alert Behavior During Therapy: WFL for tasks assessed/performed Overall Cognitive Status: Within Functional Limits for tasks assessed                                      General Comments       Exercises     Shoulder Instructions      Home Living Family/patient expects to be discharged to:: Private residence Living Arrangements: Spouse/significant other;Children Available Help at Discharge: Family;Available 24 hours/day Type of Home: House Home Access: Stairs to enter [employer] of Steps: 4 Entrance Stairs-Rails: Right;Left;Can reach both Home Layout: Multi-level Alternate Level Stairs-Number of Steps: 6 Alternate Level Stairs-Rails: Left Bathroom Shower/Tub: Teacher, early years/pre: Standard     Home Equipment: Grab bars - tub/shower          Prior Functioning/Environment Level of Independence: Independent                 OT Problem List: Decreased range of motion;Decreased knowledge of use of DME or AE;Decreased knowledge of precautions;Pain      OT Treatment/Interventions:      OT Goals(Current goals can be found in the care plan section) Acute Rehab OT Goals Patient Stated Goal: to go home OT Goal Formulation: All assessment and education complete, DC therapy  OT Frequency:     Barriers to D/C:            Co-evaluation              AM-PAC OT "6 Clicks" Daily Activity     Outcome Measure Help from another person eating meals?: None Help from another person taking care of personal grooming?: None Help from another person toileting, which includes using toliet, bedpan, or urinal?: None Help from another person bathing (including washing, rinsing, drying)?: None Help from another person to put on and taking off regular upper body clothing?: None Help from another person to put on and taking off regular lower body clothing?: None 6 Click Score: 24   End of Session Equipment Utilized During Treatment: Rolling walker(brace off after working with PT) Nurse Communication: Mobility status;Other (comment)(Pt with questions about medications)  Activity Tolerance: Patient tolerated treatment  well;Patient limited by pain Patient left: in bed;with call bell/phone within reach(sidelying)  OT Visit Diagnosis: Unsteadiness on feet (R26.81);Other abnormalities of gait and mobility (R26.89);Muscle weakness (generalized) (M62.81);Pain Pain - part of body: (Back)                Time: 2761-8485 OT Time Calculation (min): 20 min Charges:  OT General Charges $OT Visit: 1 Visit OT Evaluation $OT Eval Low Complexity: 1 Low  Shelbey Spindler MSOT, OTR/L Acute Rehab Pager: [phone] Office: [place] 05/08/2019, 8:44 AM

## 2019-05-08 NOTE — Discharge Summary (Signed)
Physician Discharge Summary  Patient ID: Rebecca Holt MRN: [id number] DOB/AGE: [birth year] 33 y.o. Estimated body mass index is 30.4 kg/m as calculated from the following:   Height as of this encounter: 5' 7.5" (1.715 m).   Weight as of this encounter: 89.4 kg.   Admit date: 05/07/2019 Discharge date: 05/08/2019  Admission Diagnoses: Lumbar spinal stenosis spondylolisthesis L5-S1  Discharge Diagnoses: Same Active Problems:   Lumbar foraminal stenosis   Discharged Condition: good  Hospital Course: Patient is admitted to hospital underwent transforaminal interbody fusion L5-S1 postoperative patient did very well covering for on the floor was ambulating voiding spontaneously tolerating regular diet stable for discharge home.  Consults:  Significant Diagnostic Studies: Treatments: TLIF L5-S1 Discharge Exam: Blood pressure 98/62, pulse 79, temperature 98.8 F (37.1 C), temperature source Oral, resp. rate 16, height 5' 7.5" (1.715 m), weight 89.4 kg, last menstrual period 04/27/2019, SpO2 100 %, unknown if currently breastfeeding. Strength 5 out of 5 wound clean dry and intact  Disposition: Home  Discharge Instructions    Face-to-face encounter (required for Medicare/Medicaid patients)   Complete by: As directed    I Lanette Ell P certify that this patient is under my care and that I, or a nurse practitioner or physician's assistant working with me, had a face-to-face encounter that meets the physician face-to-face encounter requirements with this patient on 05/08/2019. The encounter with the patient was in whole, or in part for the following medical condition(s) which is the primary reason for home health care (List medical condition): Spondylolisthesis   The encounter with the patient was in whole, or in part, for the following medical condition, which is the primary reason for home health care: Spondylolisthesis   I certify that, based on my findings, the following services are medically  necessary home health services: Physical therapy   Reason for Medically Necessary Home Health Services: Therapy- Therapeutic Exercises to Increase Strength and Endurance   My clinical findings support the need for the above services: Pain interferes with ambulation/mobility   Further, I certify that my clinical findings support that this patient is homebound due to: Pain interferes with ambulation/mobility   Home Health   Complete by: As directed    To provide the following care/treatments: PT   Incentive spirometry RT   Complete by: As directed    Walker rolling   Complete by: As directed      Allergies as of 05/08/2019      Reactions   Latex Other (See Comments)   Causes irritation.      Medication List    TAKE these medications   ARIPiprazole 2 MG tablet Commonly known as: Abilify Take 1 tablet (2 mg total) by mouth daily.   b complex vitamins tablet Take 1 tablet by mouth daily.   citalopram 40 MG tablet Commonly known as: CELEXA Take 1 tablet (40 mg total) by mouth daily.   LORazepam 0.5 MG tablet Commonly known as: ATIVAN Take one tablet daily prn anxiety. What changed:   how much to take  how to take this  when to take this  reasons to take this   Lysine 1000 MG Tabs Take 1,000 mg by mouth daily.   TURMERIC PO Take 1,950 mg by mouth daily.   Vitamin D-3 125 MCG (5000 UT) Tabs Take 5,000 Units by mouth daily.      Follow-up Information    Erline Levine, MD Follow up.   Specialty: Neurosurgery Contact information: [address] [place] [address] [place] [address] [phone]  Signed: Elvie Maines P 05/08/2019, 8:06 AM

## 2019-05-08 NOTE — Progress Notes (Signed)
Physical Therapy Treatment Patient Details Name: Rebecca Holt MRN: [id number] DOB: 08-Feb-1986 Today's Date: 05/08/2019    History of Present Illness 33 yo female s/p L5-S1 fusion with pedicle screw fixation and posterolateral arthrodesis. PMH including anxiety, arthritis, bipolar disorder, depressive disorder, and hypothyroidism.    PT Comments    Pt progressing with post-op mobility, however pain continues to limit tolerance for functional activity. Pt does report improvement in radicular symptoms and notes significant pain only at incision site. She was able to demonstrate transfers and ambulation with gross min guard assist to supervision for safety and RW for support. Reinforced education on precautions, brace application/wearing schedule, appropriate activity progression, and car transfer. Will continue to follow.      Follow Up Recommendations  Home health PT;Supervision/Assistance - 24 hour     Equipment Recommendations  Rolling walker with 5" wheels;3in1 (PT)    Recommendations for Other Services       Precautions / Restrictions Precautions Precautions: Fall;Back Precaution Booklet Issued: Yes (comment) Required Braces or Orthoses: Spinal Brace Spinal Brace: Applied in sitting position Restrictions Weight Bearing Restrictions: No    Mobility  Bed Mobility Overal bed mobility: Needs Assistance Bed Mobility: Sidelying to Sit Rolling: Supervision Sidelying to sit: Supervision       General bed mobility comments: HOB slightly elevated. Pt was able to transition to full sitting position with supervision for safety. Increased time and effort due to pain.   Transfers Overall transfer level: Needs assistance Equipment used: Rolling walker (2 wheeled) Transfers: Sit to/from Stand Sit to Stand: Min guard         General transfer comment: Pt attempting with proper hand placement on seated surface for safety. x2 attempts without success due to pain, and pt pulled up  from RW on the third attempt to full stand and min guard assist.   Ambulation/Gait Ambulation/Gait assistance: Min guard;Supervision Gait Distance (Feet): 250 Feet Assistive device: Rolling walker (2 wheeled) Gait Pattern/deviations: Step-through pattern;Decreased stride length;Antalgic;Wide base of support Gait velocity: Decreased Gait velocity interpretation: 1.31 - 2.62 ft/sec, indicative of limited community ambulator General Gait Details: Light support on RW - pt could likely progress away from walker however she feels more comfortable with it for pain control and added stability. No assistance throughout but close guard provided for safety progressing to supervision for safety by end of gait training.    Stairs Stairs: Yes Stairs assistance: Min guard Stair Management: Step to pattern Number of Stairs: 5 General stair comments: VC's for sequencing and general safety. No assist required however close hands-on guarding provided for safety throughout.    Wheelchair Mobility    Modified Rankin (Stroke Patients Only)       Balance Overall balance assessment: Needs assistance Sitting-balance support: No upper extremity supported;Feet supported Sitting balance-Leahy Scale: Fair     Standing balance support: Bilateral upper extremity supported;During functional activity Standing balance-Leahy Scale: Poor Standing balance comment: Approaching fair. Light support on RW during gait training but ambulated around room without RW                            Cognition Arousal/Alertness: Awake/alert Behavior During Therapy: [facility] for tasks assessed/performed Overall Cognitive Status: Within Functional Limits for tasks assessed                                        Exercises  General Comments        Pertinent Vitals/Pain Pain Assessment: Faces Faces Pain Scale: Hurts even more Pain Location: back Pain Descriptors / Indicators:  Aching;Grimacing;Guarding Pain Intervention(s): Limited activity within patient's tolerance;Monitored during session;Repositioned    Home Living Family/patient expects to be discharged to:: Private residence Living Arrangements: Spouse/significant other;Children Available Help at Discharge: Family;Available 24 hours/day Type of Home: House Home Access: Stairs to enter Entrance Stairs-Rails: Right;Left;Can reach both Home Layout: Multi-level Home Equipment: Grab bars - tub/shower      Prior Function Level of Independence: Independent          PT Goals (current goals can now be found in the care plan section) Acute Rehab PT Goals Patient Stated Goal: to go home PT Goal Formulation: With patient Time For Goal Achievement: 05/21/19 Potential to Achieve Goals: Good Progress towards PT goals: Progressing toward goals    Frequency    Min 5X/week      PT Plan Current plan remains appropriate    Co-evaluation              AM-PAC PT "6 Clicks" Mobility   Outcome Measure  Help needed turning from your back to your side while in a flat bed without using bedrails?: A Little Help needed moving from lying on your back to sitting on the side of a flat bed without using bedrails?: A Little Help needed moving to and from a bed to a chair (including a wheelchair)?: A Little Help needed standing up from a chair using your arms (e.g., wheelchair or bedside chair)?: A Little Help needed to walk in hospital room?: A Little Help needed climbing 3-5 steps with a railing? : A Little 6 Click Score: 18    End of Session Equipment Utilized During Treatment: Gait belt;Back brace Activity Tolerance: Patient limited by pain Patient left: Other (comment)(In room with OT present) Nurse Communication: Mobility status(request nausea meds) PT Visit Diagnosis: Unsteadiness on feet (R26.81);Muscle weakness (generalized) (M62.81);Pain Pain - part of body: (back)     Time: [id number] PT Time  Calculation (min) (ACUTE ONLY): 20 min  Charges:  $Gait Training: 8-22 mins                     Rebecca Holt, PT, DPT Acute Rehabilitation Services Pager: [phone] Office: [phone]    Rebecca Holt 05/08/2019, 8:31 AM

## 2019-05-10 ENCOUNTER — Encounter (HOSPITAL_COMMUNITY): Payer: Self-pay | Admitting: Neurosurgery

## 2019-05-14 ENCOUNTER — Other Ambulatory Visit: Payer: Self-pay | Admitting: Adult Health

## 2019-05-14 DIAGNOSIS — F332 Major depressive disorder, recurrent severe without psychotic features: Secondary | ICD-10-CM

## 2019-05-14 DIAGNOSIS — F411 Generalized anxiety disorder: Secondary | ICD-10-CM

## 2019-05-31 DIAGNOSIS — M5417 Radiculopathy, lumbosacral region: Secondary | ICD-10-CM | POA: Diagnosis not present

## 2019-06-03 DIAGNOSIS — F3181 Bipolar II disorder: Secondary | ICD-10-CM | POA: Diagnosis not present

## 2019-06-14 ENCOUNTER — Telehealth: Payer: Self-pay | Admitting: Adult Health

## 2019-06-14 DIAGNOSIS — F411 Generalized anxiety disorder: Secondary | ICD-10-CM

## 2019-06-14 DIAGNOSIS — F332 Major depressive disorder, recurrent severe without psychotic features: Secondary | ICD-10-CM

## 2019-06-14 DIAGNOSIS — G4700 Insomnia, unspecified: Secondary | ICD-10-CM

## 2019-06-14 MED ORDER — ARIPIPRAZOLE 5 MG PO TABS: 5.0000 mg | ORAL_TABLET | Freq: Every day | ORAL | 2 refills | Status: DC

## 2019-06-14 NOTE — Telephone Encounter (Signed)
Patient called and said that she is feeling awful. Said the abilify was working but now is not. Says she is angry and very agitated. Please let her know what to do whether she is suppose to go up on the abilify or change completely. Please call her at [phone]

## 2019-06-14 NOTE — Telephone Encounter (Signed)
I will send in a 5mg  and see if that is helpful.

## 2019-06-15 NOTE — Telephone Encounter (Signed)
Patient aware of dose increase to Abilify 5 mg, instructed to call back if symptoms not improving or worsening.

## 2019-06-16 DIAGNOSIS — F3181 Bipolar II disorder: Secondary | ICD-10-CM | POA: Diagnosis not present

## 2019-06-23 ENCOUNTER — Encounter: Payer: Self-pay | Admitting: Adult Health

## 2019-06-23 ENCOUNTER — Ambulatory Visit (INDEPENDENT_AMBULATORY_CARE_PROVIDER_SITE_OTHER): Payer: BC Managed Care – PPO | Admitting: Adult Health

## 2019-06-23 DIAGNOSIS — F411 Generalized anxiety disorder: Secondary | ICD-10-CM

## 2019-06-23 DIAGNOSIS — F410 Panic disorder [episodic paroxysmal anxiety] without agoraphobia: Secondary | ICD-10-CM

## 2019-06-23 DIAGNOSIS — F332 Major depressive disorder, recurrent severe without psychotic features: Secondary | ICD-10-CM | POA: Diagnosis not present

## 2019-06-23 DIAGNOSIS — G4700 Insomnia, unspecified: Secondary | ICD-10-CM

## 2019-06-23 DIAGNOSIS — F429 Obsessive-compulsive disorder, unspecified: Secondary | ICD-10-CM

## 2019-06-23 MED ORDER — LORAZEPAM 0.5 MG PO TABS: ORAL_TABLET | ORAL | 2 refills | Status: DC

## 2019-06-23 MED ORDER — OLANZAPINE 5 MG PO TABS: 5.0000 mg | ORAL_TABLET | Freq: Every day | ORAL | 2 refills | Status: DC

## 2019-06-23 MED ORDER — CITALOPRAM HYDROBROMIDE 40 MG PO TABS: 40.0000 mg | ORAL_TABLET | Freq: Every day | ORAL | 1 refills | Status: DC

## 2019-06-23 NOTE — Progress Notes (Signed)
[name] [id number] 1985-12-21 33 y.o.  Virtual Visit via Telephone Note  I connected with pt on 06/23/19 at 12:00 PM EST by telephone and verified that I am speaking with the correct person using two identifiers.   I discussed the limitations, risks, security and privacy concerns of performing an evaluation and management service by telephone and the availability of in person appointments. I also discussed with the patient that there may be a patient responsible charge related to this service. The patient expressed understanding and agreed to proceed.   I discussed the assessment and treatment plan with the patient. The patient was provided an opportunity to ask questions and all were answered. The patient agreed with the plan and demonstrated an understanding of the instructions.   The patient was advised to call back or seek an in-person evaluation if the symptoms worsen or if the condition fails to improve as anticipated.  I provided 30 minutes of non-face-to-face time during this encounter.  The patient was located at home.  The provider was located at [place].   Aloha Gell, NP   Subjective:   Patient ID:  Rebecca Holt is a 33 y.o. (DOB 06/03/1986) female.  Chief Complaint: No chief complaint on file.   HPI Shaneika Filipiak presents for follow-up of GAD, MDD, OCD, insomnia, panic attacks.  Desribes mood today as "not the best". Pleasant. Tearful at times. Mood symptoms - feels depressed, anxious, and irritable. Stating "I'm really struggling right now". Highly irritated, agitated, and restless. Not sleeping well because she is hurting so much. More anxious overall. Stating "my anxiety is through the roof". Does not feel like Ativan is helping. Has tried other things in that "class", but they were too sedating. Feels like OCD is worse because she can't do usual activities - "things are a wreck". Has no patience with herself. Cannot tell that Abilify is helping or  not. Stating "I feel so bad mentally, I can't tell anything". Had back surgery in November. Recovering and trying to do little things. On restrictions until middle of January. Decreased interest and motivation. Taking medications as prescribed.  Energy levels stable. Active, but does not have a regular exercise routine with physical limitations. Currently disabled.  Enjoys some usual interests. Married. Lives with husband and children.  Appetite adequate. Weight stable. Sleeps better some nights than others. Averages 3 to 4 hours with Ativan. Can't shut mind off - "racing all the time".  Focus and concentration "off" - "can't focus on anything". Completing tasks. Manages some aspects of household.  Denies SI and HI. Denies AH and VH.   Previous medications: Zoloft, Lexapro, Abilify  Review of Systems:  Review of Systems  Musculoskeletal: Negative for gait problem.  Neurological: Negative for tremors.  Psychiatric/Behavioral:       Please refer to HPI    Medications: I have reviewed the patient's current medications.  Current Outpatient Medications  Medication Sig Dispense Refill  . ARIPiprazole (ABILIFY) 5 MG tablet Take 1 tablet (5 mg total) by mouth daily. 30 tablet 2  . b complex vitamins tablet Take 1 tablet by mouth daily.    . Cholecalciferol (VITAMIN D-3) 125 MCG (5000 UT) TABS Take 5,000 Units by mouth daily.    . citalopram (CELEXA) 40 MG tablet TAKE ONE TABLET BY MOUTH DAILY 30 tablet 1  . LORazepam (ATIVAN) 0.5 MG tablet Take one tablet daily prn anxiety. (Patient taking differently: Take 0.5 mg by mouth daily as needed for anxiety. Take one tablet daily  prn anxiety.) 30 tablet 2  . Lysine 1000 MG TABS Take 1,000 mg by mouth daily.    . TURMERIC PO Take 1,950 mg by mouth daily.     No current facility-administered medications for this visit.    Medication Side Effects: None  Allergies:  Allergies  Allergen Reactions  . Latex Other (See Comments)    Causes irritation.     Past Medical History:  Diagnosis Date  . Anxiety   . Arthritis   . Bipolar disorder ([place])   . Chicken pox   . Depressive disorder 02/08/2019  . Hypothyroidism   . Pneumonia     Family History  Problem Relation Age of Onset  . Diabetic kidney disease Father   . Hyperlipidemia Father   . Diabetes Father   . Bipolar disorder Mother   . Mental illness Mother        bipolar  . Cancer Paternal Grandfather        colon  . Thyroid cancer Maternal Grandmother   . Cancer Maternal Grandfather        leukemia  . Anesthesia problems Neg Hx   . Hypotension Neg Hx   . Malignant hyperthermia Neg Hx   . Pseudochol deficiency Neg Hx     Social History   Socioeconomic History  . Marital status: Married    Spouse name: Not on file  . Number of children: Not on file  . Years of education: Not on file  . Highest education level: Not on file  Occupational History  . Not on file  Tobacco Use  . Smoking status: Never Smoker  . Smokeless tobacco: Never Used  Substance and Sexual Activity  . Alcohol use: No  . Drug use: No  . Sexual activity: Yes    Birth control/protection: None    Comment: tubal after   Other Topics Concern  . Not on file  Social History Narrative  . Not on file   Social Determinants of Health   Financial Resource Strain:   . Difficulty of Paying Living Expenses: Not on file  Food Insecurity:   . Worried About Charity fundraiser in the Last Year: Not on file  . Ran Out of Food in the Last Year: Not on file  Transportation Needs:   . Lack of Transportation (Medical): Not on file  . Lack of Transportation (Non-Medical): Not on file  Physical Activity:   . Days of Exercise per Week: Not on file  . Minutes of Exercise per Session: Not on file  Stress:   . Feeling of Stress : Not on file  Social Connections:   . Frequency of Communication with Friends and Family: Not on file  . Frequency of Social Gatherings with Friends and Family: Not on file  .  Attends Religious Services: Not on file  . Active Member of Clubs or Organizations: Not on file  . Attends Archivist Meetings: Not on file  . Marital Status: Not on file  Intimate Partner Violence:   . Fear of Current or Ex-Partner: Not on file  . Emotionally Abused: Not on file  . Physically Abused: Not on file  . Sexually Abused: Not on file    Past Medical History, Surgical history, Social history, and Family history were reviewed and updated as appropriate.   Please see review of systems for further details on the patient's review from today.   Objective:   Physical Exam:  There were no vitals taken for this visit.  Physical Exam Constitutional:      General: She is not in acute distress.    Appearance: She is well-developed.  Musculoskeletal:        General: No deformity.  Neurological:     Mental Status: She is alert and oriented to person, place, and time.     Coordination: Coordination normal.  Psychiatric:        Attention and Perception: Attention and perception normal. She does not perceive auditory or visual hallucinations.        Mood and Affect: Mood is anxious and depressed. Affect is tearful. Affect is not labile, blunt, angry or inappropriate.        Speech: Speech normal.        Behavior: Behavior is agitated.        Thought Content: Thought content normal. Thought content is not paranoid or delusional. Thought content does not include homicidal or suicidal ideation. Thought content does not include homicidal or suicidal plan.        Cognition and Memory: Cognition and memory normal.        Judgment: Judgment normal.     Comments: Insight intact     Lab Review:     Component Value Date/Time   NA 136 03/16/2019 0926   K 3.7 03/16/2019 0926   CL 102 03/16/2019 0926   CO2 22 03/16/2019 0926   GLUCOSE 94 03/16/2019 0926   BUN 8 03/16/2019 0926   CREATININE 0.68 03/16/2019 0926   CALCIUM 9.1 03/16/2019 0926   PROT 7.0 03/16/2019 0926    ALBUMIN 3.9 03/16/2019 0926   AST 21 03/16/2019 0926   ALT 22 03/16/2019 0926   ALKPHOS 55 03/16/2019 0926   BILITOT 0.6 03/16/2019 0926   GFRNONAA >60 03/16/2019 0926   GFRAA >60 03/16/2019 0926       Component Value Date/Time   WBC 6.8 05/04/2019 1044   RBC 4.40 05/04/2019 1044   HGB 12.1 05/04/2019 1044   HCT 38.3 05/04/2019 1044   PLT 299 05/04/2019 1044   MCV 87.0 05/04/2019 1044   MCH 27.5 05/04/2019 1044   MCHC 31.6 05/04/2019 1044   RDW 12.8 05/04/2019 1044   LYMPHSABS 1.6 03/16/2019 0926   MONOABS 0.4 03/16/2019 0926   EOSABS 0.1 03/16/2019 0926   BASOSABS 0.0 03/16/2019 0926    No results found for: POCLITH, LITHIUM   No results found for: PHENYTOIN, PHENOBARB, VALPROATE, CBMZ   .res Assessment: Plan:    Plan:  Celexa 40mg  daily to target mood symptoms.  Lorazepam 0.5mg  daily prn anxiety  Add Zyprexa 5mg  - 1/2 at bedtime x 3 nights, then one tablet Discontinue Abilify 5mg  - 1/2 tab daily x 3, then leave off  Also consider - Rexulti or Latuda  RTC 4 weeks  Patient advised to contact office with any questions, adverse effects, or acute worsening in signs and symptoms.  Discussed potential benefits, risk, and side effects of benzodiazepines to include potential risk of tolerance and dependence, as well as possible drowsiness.  Advised patient not to drive if experiencing drowsiness and to take lowest possible effective dose to minimize risk of dependence and tolerance.  Discussed potential metabolic side effects associated with atypical antipsychotics, as well as potential risk for movement side effects. Advised pt to contact office if movement side effects occur.     There are no diagnoses linked to this encounter.  Please see After Visit Summary for patient specific instructions.  No future appointments.  No orders of the defined types  were placed in this encounter.     -------------------------------

## 2019-07-06 DIAGNOSIS — F3181 Bipolar II disorder: Secondary | ICD-10-CM | POA: Diagnosis not present

## 2019-07-13 DIAGNOSIS — F3181 Bipolar II disorder: Secondary | ICD-10-CM | POA: Diagnosis not present

## 2019-07-19 DIAGNOSIS — F3181 Bipolar II disorder: Secondary | ICD-10-CM | POA: Diagnosis not present

## 2019-07-19 DIAGNOSIS — M48061 Spinal stenosis, lumbar region without neurogenic claudication: Secondary | ICD-10-CM | POA: Diagnosis not present

## 2019-08-09 DIAGNOSIS — F3181 Bipolar II disorder: Secondary | ICD-10-CM | POA: Diagnosis not present

## 2019-08-23 DIAGNOSIS — E669 Obesity, unspecified: Secondary | ICD-10-CM | POA: Diagnosis not present

## 2019-08-23 DIAGNOSIS — Z130 Encounter for screening for diseases of the blood and blood-forming organs and certain disorders involving the immune mechanism: Secondary | ICD-10-CM | POA: Diagnosis not present

## 2019-08-23 DIAGNOSIS — Z113 Encounter for screening for infections with a predominantly sexual mode of transmission: Secondary | ICD-10-CM | POA: Diagnosis not present

## 2019-08-23 DIAGNOSIS — Z1389 Encounter for screening for other disorder: Secondary | ICD-10-CM | POA: Diagnosis not present

## 2019-08-23 DIAGNOSIS — Z01419 Encounter for gynecological examination (general) (routine) without abnormal findings: Secondary | ICD-10-CM | POA: Diagnosis not present

## 2019-09-13 ENCOUNTER — Encounter: Payer: Self-pay | Admitting: Adult Health

## 2019-09-13 ENCOUNTER — Ambulatory Visit (INDEPENDENT_AMBULATORY_CARE_PROVIDER_SITE_OTHER): Payer: BC Managed Care – PPO | Admitting: Adult Health

## 2019-09-13 DIAGNOSIS — G4700 Insomnia, unspecified: Secondary | ICD-10-CM | POA: Diagnosis not present

## 2019-09-13 DIAGNOSIS — F332 Major depressive disorder, recurrent severe without psychotic features: Secondary | ICD-10-CM

## 2019-09-13 DIAGNOSIS — F410 Panic disorder [episodic paroxysmal anxiety] without agoraphobia: Secondary | ICD-10-CM | POA: Diagnosis not present

## 2019-09-13 DIAGNOSIS — F411 Generalized anxiety disorder: Secondary | ICD-10-CM | POA: Diagnosis not present

## 2019-09-13 DIAGNOSIS — F429 Obsessive-compulsive disorder, unspecified: Secondary | ICD-10-CM

## 2019-09-13 MED ORDER — CITALOPRAM HYDROBROMIDE 40 MG PO TABS: 40.0000 mg | ORAL_TABLET | Freq: Every day | ORAL | 1 refills | Status: DC

## 2019-09-13 MED ORDER — LORAZEPAM 0.5 MG PO TABS: ORAL_TABLET | ORAL | 2 refills | Status: DC

## 2019-09-13 NOTE — Progress Notes (Signed)
Memori Hiramoto [id number] 1985/11/26 34 y.o.  Virtual Visit via Telephone Note  I connected with pt on 09/13/19 at  9:40 AM EDT by telephone and verified that I am speaking with the correct person using two identifiers.   I discussed the limitations, risks, security and privacy concerns of performing an evaluation and management service by telephone and the availability of in person appointments. I also discussed with the patient that there may be a patient responsible charge related to this service. The patient expressed understanding and agreed to proceed.   I discussed the assessment and treatment plan with the patient. The patient was provided an opportunity to ask questions and all were answered. The patient agreed with the plan and demonstrated an understanding of the instructions.   The patient was advised to call back or seek an in-person evaluation if the symptoms worsen or if the condition fails to improve as anticipated.  I provided 30 minutes of non-face-to-face time during this encounter.  The patient was located at home.  The provider was located at [place].   Aloha Gell, NP   Subjective:   Patient ID:  Rebecca Holt is a 34 y.o. (DOB [birth year]) female.  Chief Complaint: No chief complaint on file.   HPI Rebecca Holt presents for follow-up of GAD, MDD, OCD, insomnia, panic attacks.  Desribes mood today as "up and down". Pleasant. Tearful at times. Mood symptoms - feels depressed, anxious, and irritable - "some days better than others". Some "stress and anxiety" with son and his diagnosis of ADHD. Back surgery went well - will take 6 months to completely heel. Able to bend and lift 20 pounds Is now able to do more - "not crawling out of bed". Mood has "leveled" out some. Stating "some days I'm fine and other days I'm not". Varying interest and motivation.Taking medication as prescribed. Energy levels vary. Active, but does not have a regular exercise routine  with physical limitations. Currently disabled.  Enjoys some usual interests. Married. Lives with husband and children.  Appetite adequate. Weight stable. Sleeps well most nights - "so tired from the day, is able to go to sleep". Averages 5 to 6 hours.  Focus and concentration difficulties. Completing tasks. Manages some aspects of household.  Denies SI and HI. Denies AH and VH.   Previous medications: Zoloft, Lexapro, Abilify  Review of Systems:  Review of Systems  Musculoskeletal: Negative for gait problem.  Neurological: Negative for tremors.  Psychiatric/Behavioral:       Please refer to HPI    Medications: I have reviewed the patient's current medications.  Current Outpatient Medications  Medication Sig Dispense Refill  . b complex vitamins tablet Take 1 tablet by mouth daily.    . Cholecalciferol (VITAMIN D-3) 125 MCG (5000 UT) TABS Take 5,000 Units by mouth daily.    . citalopram (CELEXA) 40 MG tablet Take 1 tablet (40 mg total) by mouth daily. 90 tablet 1  . LORazepam (ATIVAN) 0.5 MG tablet Take one tablet daily prn anxiety. 30 tablet 2  . Lysine 1000 MG TABS Take 1,000 mg by mouth daily.    Marland Kitchen OLANZapine (ZYPREXA) 5 MG tablet Take 1 tablet (5 mg total) by mouth at bedtime. 30 tablet 2  . TURMERIC PO Take 1,950 mg by mouth daily.     No current facility-administered medications for this visit.    Medication Side Effects: None  Allergies:  Allergies  Allergen Reactions  . Latex Other (See Comments)    Causes irritation.  Past Medical History:  Diagnosis Date  . Anxiety   . Arthritis   . Bipolar disorder ([place])   . Chicken pox   . Depressive disorder 02/08/2019  . Hypothyroidism   . Pneumonia     Family History  Problem Relation Age of Onset  . Diabetic kidney disease Father   . Hyperlipidemia Father   . Diabetes Father   . Bipolar disorder Mother   . Mental illness Mother        bipolar  . Cancer Paternal Grandfather        colon  . Thyroid cancer  Maternal Grandmother   . Cancer Maternal Grandfather        leukemia  . Anesthesia problems Neg Hx   . Hypotension Neg Hx   . Malignant hyperthermia Neg Hx   . Pseudochol deficiency Neg Hx     Social History   Socioeconomic History  . Marital status: Married    Spouse name: Not on file  . Number of children: Not on file  . Years of education: Not on file  . Highest education level: Not on file  Occupational History  . Not on file  Tobacco Use  . Smoking status: Never Smoker  . Smokeless tobacco: Never Used  Substance and Sexual Activity  . Alcohol use: No  . Drug use: No  . Sexual activity: Yes    Birth control/protection: None    Comment: tubal after   Other Topics Concern  . Not on file  Social History Narrative  . Not on file   Social Determinants of Health   Financial Resource Strain:   . Difficulty of Paying Living Expenses:   Food Insecurity:   . Worried About Charity fundraiser in the Last Year:   . Arboriculturist in the Last Year:   Transportation Needs:   . Film/video editor (Medical):   Marland Kitchen Lack of Transportation (Non-Medical):   Physical Activity:   . Days of Exercise per Week:   . Minutes of Exercise per Session:   Stress:   . Feeling of Stress :   Social Connections:   . Frequency of Communication with Friends and Family:   . Frequency of Social Gatherings with Friends and Family:   . Attends Religious Services:   . Active Member of Clubs or Organizations:   . Attends Archivist Meetings:   Marland Kitchen Marital Status:   Intimate Partner Violence:   . Fear of Current or Ex-Partner:   . Emotionally Abused:   Marland Kitchen Physically Abused:   . Sexually Abused:     Past Medical History, Surgical history, Social history, and Family history were reviewed and updated as appropriate.   Please see review of systems for further details on the patient's review from today.   Objective:   Physical Exam:  There were no vitals taken for this  visit.  Physical Exam Neurological:     Mental Status: She is alert and oriented to person, place, and time.     Cranial Nerves: No dysarthria.  Psychiatric:        Attention and Perception: Attention and perception normal.        Mood and Affect: Mood normal.        Speech: Speech normal.        Behavior: Behavior is cooperative.        Thought Content: Thought content normal. Thought content is not paranoid or delusional. Thought content does not include homicidal or suicidal ideation.  Thought content does not include homicidal or suicidal plan.        Cognition and Memory: Cognition and memory normal.        Judgment: Judgment normal.     Comments: Insight intact     Lab Review:     Component Value Date/Time   NA 136 03/16/2019 0926   K 3.7 03/16/2019 0926   CL 102 03/16/2019 0926   CO2 22 03/16/2019 0926   GLUCOSE 94 03/16/2019 0926   BUN 8 03/16/2019 0926   CREATININE 0.68 03/16/2019 0926   CALCIUM 9.1 03/16/2019 0926   PROT 7.0 03/16/2019 0926   ALBUMIN 3.9 03/16/2019 0926   AST 21 03/16/2019 0926   ALT 22 03/16/2019 0926   ALKPHOS 55 03/16/2019 0926   BILITOT 0.6 03/16/2019 0926   GFRNONAA >60 03/16/2019 0926   GFRAA >60 03/16/2019 0926       Component Value Date/Time   WBC 6.8 05/04/2019 1044   RBC 4.40 05/04/2019 1044   HGB 12.1 05/04/2019 1044   HCT 38.3 05/04/2019 1044   PLT 299 05/04/2019 1044   MCV 87.0 05/04/2019 1044   MCH 27.5 05/04/2019 1044   MCHC 31.6 05/04/2019 1044   RDW 12.8 05/04/2019 1044   LYMPHSABS 1.6 03/16/2019 0926   MONOABS 0.4 03/16/2019 0926   EOSABS 0.1 03/16/2019 0926   BASOSABS 0.0 03/16/2019 0926    No results found for: POCLITH, LITHIUM   No results found for: PHENYTOIN, PHENOBARB, VALPROATE, CBMZ   .res Assessment: Plan:    Plan:  Celexa 40mg  daily to target mood symptoms.  Lorazepam 0.5mg  daily prn anxiety  RTC 3 months  Patient advised to contact office with any questions, adverse effects, or acute worsening  in signs and symptoms.  Discussed potential benefits, risk, and side effects of benzodiazepines to include potential risk of tolerance and dependence, as well as possible drowsiness.  Advised patient not to drive if experiencing drowsiness and to take lowest possible effective dose to minimize risk of dependence and tolerance.  Discussed potential metabolic side effects associated with atypical antipsychotics, as well as potential risk for movement side effects. Advised pt to contact office if movement side effects occur.    There are no diagnoses linked to this encounter.  Please see After Visit Summary for patient specific instructions.  Future Appointments  Date Time Provider [place]  09/13/2019  9:40 AM Gardiner [place]a, Berdie Ogren, NP [facility] None    No orders of the defined types were placed in this encounter.     -------------------------------

## 2019-10-04 DIAGNOSIS — M545 Low back pain: Secondary | ICD-10-CM | POA: Diagnosis not present

## 2019-10-04 DIAGNOSIS — Z6832 Body mass index (BMI) 32.0-32.9, adult: Secondary | ICD-10-CM | POA: Diagnosis not present

## 2019-10-04 DIAGNOSIS — M5417 Radiculopathy, lumbosacral region: Secondary | ICD-10-CM | POA: Diagnosis not present

## 2019-10-07 LAB — CBC AND DIFFERENTIAL: Hemoglobin: 11.3 — AB (ref 12.0–16.0)

## 2019-10-14 ENCOUNTER — Other Ambulatory Visit: Payer: Self-pay | Admitting: Adult Health

## 2019-10-14 ENCOUNTER — Telehealth: Payer: Self-pay | Admitting: Adult Health

## 2019-10-14 DIAGNOSIS — F332 Major depressive disorder, recurrent severe without psychotic features: Secondary | ICD-10-CM

## 2019-10-14 MED ORDER — BUPROPION HCL ER (XL) 150 MG PO TB24: ORAL_TABLET | ORAL | 2 refills | Status: DC

## 2019-10-14 NOTE — Telephone Encounter (Signed)
Patient called and left a message that said that she wants to talk to gina about her medicattions and her depression. Please give her a call back at [phone]

## 2019-10-14 NOTE — Telephone Encounter (Signed)
Has she taken Wellbutrin in the past? Any seizures? If not we can add Wellbutrin XL 150mg  daily x 7, then increase to 300mg  daily.

## 2019-10-14 NOTE — Telephone Encounter (Signed)
Yes on the Celexa. Script for Wellbutrin sent.

## 2019-10-15 NOTE — Telephone Encounter (Signed)
Patient aware.

## 2019-10-18 DIAGNOSIS — M545 Low back pain: Secondary | ICD-10-CM | POA: Diagnosis not present

## 2019-10-26 DIAGNOSIS — L700 Acne vulgaris: Secondary | ICD-10-CM | POA: Diagnosis not present

## 2019-10-26 DIAGNOSIS — I781 Nevus, non-neoplastic: Secondary | ICD-10-CM | POA: Diagnosis not present

## 2019-10-26 DIAGNOSIS — Z79899 Other long term (current) drug therapy: Secondary | ICD-10-CM | POA: Diagnosis not present

## 2019-10-26 DIAGNOSIS — L739 Follicular disorder, unspecified: Secondary | ICD-10-CM | POA: Diagnosis not present

## 2019-10-26 DIAGNOSIS — D485 Neoplasm of uncertain behavior of skin: Secondary | ICD-10-CM | POA: Diagnosis not present

## 2019-11-23 DIAGNOSIS — N9411 Superficial (introital) dyspareunia: Secondary | ICD-10-CM | POA: Diagnosis not present

## 2019-11-23 DIAGNOSIS — N3946 Mixed incontinence: Secondary | ICD-10-CM | POA: Diagnosis not present

## 2019-11-23 DIAGNOSIS — B372 Candidiasis of skin and nail: Secondary | ICD-10-CM | POA: Diagnosis not present

## 2019-11-26 DIAGNOSIS — R102 Pelvic and perineal pain: Secondary | ICD-10-CM | POA: Diagnosis not present

## 2019-11-30 DIAGNOSIS — Z79899 Other long term (current) drug therapy: Secondary | ICD-10-CM | POA: Diagnosis not present

## 2019-12-06 ENCOUNTER — Telehealth: Payer: Self-pay | Admitting: Adult Health

## 2019-12-06 NOTE — Telephone Encounter (Signed)
May need to restart it.

## 2019-12-06 NOTE — Telephone Encounter (Signed)
Rtc to patient and she would prefer to not restart even though it would probably help her symptoms. Said she made it this far so hoping they won't continue much longer. She does have Zofran at home to use. Advised her to set up apt in next week or 2 for follow up. She agreed.

## 2019-12-06 NOTE — Telephone Encounter (Signed)
Pt started on wellbutrin while taking Celexa then she eventually stopped taking it. Pt said that she is now experiencing constant vomiting, nausea. Pt wants to know what she can do. Please call.

## 2019-12-15 NOTE — Telephone Encounter (Signed)
Patient called back again today still having nausea. The Zofran is not helping. Last week advised her she may need to restart Celexa, but she did not want to do that. Was hoping symptoms would improve. Please advise

## 2019-12-15 NOTE — Telephone Encounter (Signed)
Called and spoke with patient.

## 2019-12-15 NOTE — Telephone Encounter (Signed)
Can restart the Celexa to see if it helps. Symptoms should resolve.

## 2019-12-28 DIAGNOSIS — N83202 Unspecified ovarian cyst, left side: Secondary | ICD-10-CM | POA: Diagnosis not present

## 2019-12-28 DIAGNOSIS — R102 Pelvic and perineal pain: Secondary | ICD-10-CM | POA: Diagnosis not present

## 2020-01-06 ENCOUNTER — Telehealth: Payer: Self-pay | Admitting: Adult Health

## 2020-01-06 NOTE — Telephone Encounter (Signed)
Pt said that she has not been feeling well lately since she has stopped taking Celexa and started on Wellbutrin. Please call. Pt has appt on 7/12

## 2020-01-06 NOTE — Telephone Encounter (Signed)
Should not be having nausea associated with SSRI discontinuation at this point. May want to follow up with PCP for prolonged nausea. She can increased Wellbutrin to 450mg .

## 2020-01-07 ENCOUNTER — Other Ambulatory Visit: Payer: Self-pay

## 2020-01-07 DIAGNOSIS — F411 Generalized anxiety disorder: Secondary | ICD-10-CM

## 2020-01-07 MED ORDER — LORAZEPAM 1 MG PO TABS: ORAL_TABLET | ORAL | 0 refills | Status: DC

## 2020-01-07 NOTE — Telephone Encounter (Signed)
Ok to increase?

## 2020-01-11 ENCOUNTER — Ambulatory Visit (INDEPENDENT_AMBULATORY_CARE_PROVIDER_SITE_OTHER): Payer: BC Managed Care – PPO | Admitting: Adult Health

## 2020-01-11 ENCOUNTER — Encounter (INDEPENDENT_AMBULATORY_CARE_PROVIDER_SITE_OTHER): Payer: Self-pay

## 2020-01-11 ENCOUNTER — Other Ambulatory Visit: Payer: Self-pay

## 2020-01-11 ENCOUNTER — Encounter: Payer: Self-pay | Admitting: Adult Health

## 2020-01-11 DIAGNOSIS — F410 Panic disorder [episodic paroxysmal anxiety] without agoraphobia: Secondary | ICD-10-CM | POA: Diagnosis not present

## 2020-01-11 DIAGNOSIS — G4700 Insomnia, unspecified: Secondary | ICD-10-CM

## 2020-01-11 DIAGNOSIS — F332 Major depressive disorder, recurrent severe without psychotic features: Secondary | ICD-10-CM | POA: Diagnosis not present

## 2020-01-11 DIAGNOSIS — F411 Generalized anxiety disorder: Secondary | ICD-10-CM

## 2020-01-11 MED ORDER — LORAZEPAM 1 MG PO TABS: ORAL_TABLET | ORAL | 2 refills | Status: DC

## 2020-01-11 MED ORDER — BUPROPION HCL ER (XL) 150 MG PO TB24: ORAL_TABLET | ORAL | 2 refills | Status: DC

## 2020-01-11 NOTE — Progress Notes (Signed)
Rebecca Holt [id number] [birth year] 34 y.o.  Subjective:   Patient ID:  Rebecca Holt is a 34 y.o. (DOB 07-27-1985) female.  Chief Complaint: No chief complaint on file.   HPI Jesika Men presents to the office today for follow-up of GAD, MDD, OCD, insomnia, panic attacks.  Desribes mood today as "ok". Pleasant. Tearful at times. Mood symptoms - decreased depression, anxiety, and irritability. Stating "the past 2 days, I have felt better". Brain zaps from stopping Celexa have subsided. Continues to feel nauseas - feels like he may be acid reflux. Has an appointment with PCP in September and GI consult in September. Varying interest and motivation.Taking medication as prescribed. Energy levels vary. Active, but does not have a regular exercise routine with physical limitations.  Enjoys some usual interests. Married. Lives with husband and 3 children.  Appetite decreased. Weight stable - 190 pounds. Sleeps well most nights. Averages 6 hours.  Focus and concentration difficulties. Completing tasks. Manages some aspects of household.  Denies SI and HI. Denies AH and VH.   Previous medications: Zoloft, Lexapro, Abilify, Celexa   Review of Systems:  Review of Systems  Musculoskeletal: Negative for gait problem.  Neurological: Negative for tremors.  Psychiatric/Behavioral:       Please refer to HPI    Medications: I have reviewed the patient's current medications.  Current Outpatient Medications  Medication Sig Dispense Refill  . buPROPion (WELLBUTRIN XL) 150 MG 24 hr tablet Take three tablets every morning. 90 tablet 2  . LORazepam (ATIVAN) 1 MG tablet Take one tablet at bedtime as needed for sleep, and 1/2 tablet during the day prn anxiety 45 tablet 2   No current facility-administered medications for this visit.    Medication Side Effects: None  Allergies:  Allergies  Allergen Reactions  . Tramadol Itching  . Latex Other (See Comments)    Causes irritation.    Past Medical  History:  Diagnosis Date  . Anxiety   . Arthritis   . Bipolar disorder ([place])   . Chicken pox   . Depressive disorder 02/08/2019  . Hypothyroidism   . Pneumonia     Family History  Problem Relation Age of Onset  . Diabetic kidney disease Father   . Hyperlipidemia Father   . Diabetes Father   . Bipolar disorder Mother   . Mental illness Mother        bipolar  . Cancer Paternal Grandfather        colon  . Thyroid cancer Maternal Grandmother   . Cancer Maternal Grandfather        leukemia  . Anesthesia problems Neg Hx   . Hypotension Neg Hx   . Malignant hyperthermia Neg Hx   . Pseudochol deficiency Neg Hx     Social History   Socioeconomic History  . Marital status: Married    Spouse name: Not on file  . Number of children: Not on file  . Years of education: Not on file  . Highest education level: Not on file  Occupational History  . Not on file  Tobacco Use  . Smoking status: Never Smoker  . Smokeless tobacco: Never Used  Vaping Use  . Vaping Use: Never used  Substance and Sexual Activity  . Alcohol use: No  . Drug use: No  . Sexual activity: Yes    Birth control/protection: None    Comment: tubal after   Other Topics Concern  . Not on file  Social History Narrative  . Not on file  Social Determinants of Health   Financial Resource Strain:   . Difficulty of Paying Living Expenses:   Food Insecurity:   . Worried About Charity fundraiser in the Last Year:   . Arboriculturist in the Last Year:   Transportation Needs:   . Film/video editor (Medical):   Marland Kitchen Lack of Transportation (Non-Medical):   Physical Activity:   . Days of Exercise per Week:   . Minutes of Exercise per Session:   Stress:   . Feeling of Stress :   Social Connections:   . Frequency of Communication with Friends and Family:   . Frequency of Social Gatherings with Friends and Family:   . Attends Religious Services:   . Active Member of Clubs or Organizations:   . Attends English as a second language teacher Meetings:   Marland Kitchen Marital Status:   Intimate Partner Violence:   . Fear of Current or Ex-Partner:   . Emotionally Abused:   Marland Kitchen Physically Abused:   . Sexually Abused:     Past Medical History, Surgical history, Social history, and Family history were reviewed and updated as appropriate.   Please see review of systems for further details on the patient's review from today.   Objective:   Physical Exam:  There were no vitals taken for this visit.  Physical Exam Constitutional:      General: She is not in acute distress. Musculoskeletal:        General: No deformity.  Neurological:     Mental Status: She is alert and oriented to person, place, and time.     Coordination: Coordination normal.  Psychiatric:        Attention and Perception: Attention and perception normal. She does not perceive auditory or visual hallucinations.        Mood and Affect: Mood normal. Mood is not anxious or depressed. Affect is not labile, blunt, angry or inappropriate.        Speech: Speech normal.        Behavior: Behavior normal.        Thought Content: Thought content normal. Thought content is not paranoid or delusional. Thought content does not include homicidal or suicidal ideation. Thought content does not include homicidal or suicidal plan.        Cognition and Memory: Cognition and memory normal.        Judgment: Judgment normal.     Comments: Insight intact     Lab Review:     Component Value Date/Time   NA 136 03/16/2019 0926   K 3.7 03/16/2019 0926   CL 102 03/16/2019 0926   CO2 22 03/16/2019 0926   GLUCOSE 94 03/16/2019 0926   BUN 8 03/16/2019 0926   CREATININE 0.68 03/16/2019 0926   CALCIUM 9.1 03/16/2019 0926   PROT 7.0 03/16/2019 0926   ALBUMIN 3.9 03/16/2019 0926   AST 21 03/16/2019 0926   ALT 22 03/16/2019 0926   ALKPHOS 55 03/16/2019 0926   BILITOT 0.6 03/16/2019 0926   GFRNONAA >60 03/16/2019 0926   GFRAA >60 03/16/2019 0926       Component Value  Date/Time   WBC 6.8 05/04/2019 1044   RBC 4.40 05/04/2019 1044   HGB 12.1 05/04/2019 1044   HCT 38.3 05/04/2019 1044   PLT 299 05/04/2019 1044   MCV 87.0 05/04/2019 1044   MCH 27.5 05/04/2019 1044   MCHC 31.6 05/04/2019 1044   RDW 12.8 05/04/2019 1044   LYMPHSABS 1.6 03/16/2019 0926   MONOABS 0.4  03/16/2019 0926   EOSABS 0.1 03/16/2019 0926   BASOSABS 0.0 03/16/2019 0926    No results found for: POCLITH, LITHIUM   No results found for: PHENYTOIN, PHENOBARB, VALPROATE, CBMZ   .res Assessment: Plan:    Plan:  Continue Wellbutrin XL 450mg  daily Lorazepam 1mg  daily prn anxiety  RTC 3 months  Patient advised to contact office with any questions, adverse effects, or acute worsening in signs and symptoms.  Discussed potential benefits, risk, and side effects of benzodiazepines to include potential risk of tolerance and dependence, as well as possible drowsiness.  Advised patient not to drive if experiencing drowsiness and to take lowest possible effective dose to minimize risk of dependence and tolerance.  Discussed potential metabolic side effects associated with atypical antipsychotics, as well as potential risk for movement side effects. Advised pt to contact office if movement side effects occur.    Diagnoses and all orders for this visit:  Insomnia, unspecified type  Severe episode of recurrent major depressive disorder, without psychotic features (HCC) -     buPROPion (WELLBUTRIN XL) 150 MG 24 hr tablet; Take three tablets every morning.  Generalized anxiety disorder -     LORazepam (ATIVAN) 1 MG tablet; Take one tablet at bedtime as needed for sleep, and 1/2 tablet during the day prn anxiety  Panic attacks     Please see After Visit Summary for patient specific instructions.  Future Appointments  Date Time Provider [place]  03/20/2020 11:00 AM Leamon Arnt, MD [facility]    No orders of the defined types were placed in this  encounter.   -------------------------------

## 2020-01-17 ENCOUNTER — Telehealth: Payer: Self-pay | Admitting: Adult Health

## 2020-01-17 NOTE — Telephone Encounter (Signed)
Pt called to report not feeling any better and have other things going on. Would like to know what Rollene Fare thinks about issues. [phone].

## 2020-01-17 NOTE — Telephone Encounter (Signed)
Noted. I will pull her chart and review. Then we can discuss further.

## 2020-01-17 NOTE — Telephone Encounter (Signed)
Have her go back down on the Wellbutrin. May need to add Latuda or Abilify to help with the depression.

## 2020-01-17 NOTE — Telephone Encounter (Signed)
Latuda?

## 2020-01-19 ENCOUNTER — Other Ambulatory Visit: Payer: Self-pay | Admitting: Adult Health

## 2020-01-19 DIAGNOSIS — F332 Major depressive disorder, recurrent severe without psychotic features: Secondary | ICD-10-CM

## 2020-01-31 DIAGNOSIS — H0102B Squamous blepharitis left eye, upper and lower eyelids: Secondary | ICD-10-CM | POA: Diagnosis not present

## 2020-01-31 DIAGNOSIS — H0288A Meibomian gland dysfunction right eye, upper and lower eyelids: Secondary | ICD-10-CM | POA: Diagnosis not present

## 2020-01-31 DIAGNOSIS — H0102A Squamous blepharitis right eye, upper and lower eyelids: Secondary | ICD-10-CM | POA: Diagnosis not present

## 2020-01-31 DIAGNOSIS — H5213 Myopia, bilateral: Secondary | ICD-10-CM | POA: Diagnosis not present

## 2020-01-31 DIAGNOSIS — D1809 Hemangioma of other sites: Secondary | ICD-10-CM | POA: Diagnosis not present

## 2020-01-31 DIAGNOSIS — H52223 Regular astigmatism, bilateral: Secondary | ICD-10-CM | POA: Diagnosis not present

## 2020-02-01 DIAGNOSIS — L309 Dermatitis, unspecified: Secondary | ICD-10-CM | POA: Diagnosis not present

## 2020-02-01 DIAGNOSIS — L700 Acne vulgaris: Secondary | ICD-10-CM | POA: Diagnosis not present

## 2020-02-01 DIAGNOSIS — L818 Other specified disorders of pigmentation: Secondary | ICD-10-CM | POA: Diagnosis not present

## 2020-02-02 DIAGNOSIS — R21 Rash and other nonspecific skin eruption: Secondary | ICD-10-CM | POA: Diagnosis not present

## 2020-02-02 LAB — BASIC METABOLIC PANEL
BUN: 10 (ref 4–21)
CO2: 28 — AB (ref 13–22)
Chloride: 104 (ref 99–108)
Creatinine: 0.9 (ref 0.5–1.1)
Glucose: 91
Potassium: 4.8 (ref 3.4–5.3)
Sodium: 141 (ref 137–147)

## 2020-02-02 LAB — COMPREHENSIVE METABOLIC PANEL
Albumin: 4.2 (ref 3.5–5.0)
GFR calc Af Amer: 90.96
GFR calc non Af Amer: 75.17
Globulin: 3.3

## 2020-02-02 LAB — CBC: RBC: 4.44 (ref 3.87–5.11)

## 2020-02-02 LAB — CBC AND DIFFERENTIAL
HCT: 36 (ref 36–46)
WBC: 5.4

## 2020-02-02 LAB — HEPATIC FUNCTION PANEL
ALT: 24 (ref 7–35)
AST: 14 (ref 13–35)
Alkaline Phosphatase: 71 (ref 25–125)

## 2020-02-07 ENCOUNTER — Other Ambulatory Visit: Payer: Self-pay

## 2020-02-07 ENCOUNTER — Encounter: Payer: Self-pay | Admitting: Psychiatry

## 2020-02-07 ENCOUNTER — Ambulatory Visit (INDEPENDENT_AMBULATORY_CARE_PROVIDER_SITE_OTHER): Payer: BC Managed Care – PPO | Admitting: Psychiatry

## 2020-02-07 DIAGNOSIS — F411 Generalized anxiety disorder: Secondary | ICD-10-CM

## 2020-02-07 DIAGNOSIS — F39 Unspecified mood [affective] disorder: Secondary | ICD-10-CM

## 2020-02-07 MED ORDER — LAMOTRIGINE 25 MG PO TABS: ORAL_TABLET | ORAL | 0 refills | Status: DC
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
## 2020-02-08 ENCOUNTER — Encounter (INDEPENDENT_AMBULATORY_CARE_PROVIDER_SITE_OTHER): Payer: Self-pay | Admitting: Ophthalmology

## 2020-02-08 ENCOUNTER — Ambulatory Visit (INDEPENDENT_AMBULATORY_CARE_PROVIDER_SITE_OTHER): Payer: BC Managed Care – PPO | Admitting: Ophthalmology

## 2020-02-08 DIAGNOSIS — D3131 Benign neoplasm of right choroid: Secondary | ICD-10-CM

## 2020-02-08 DIAGNOSIS — M545 Low back pain: Secondary | ICD-10-CM | POA: Diagnosis not present

## 2020-02-08 MED ORDER — FLUORESCEIN SODIUM 10 % IV SOLN
500.0000 mg | INTRAVENOUS | Status: AC | PRN
  Administered 2020-02-08: 500 mg via INTRAVENOUS

## 2020-02-08 NOTE — Patient Instructions (Signed)
Patient to notify us of any rapid change in visual acuity or new onset symptoms in either eye.
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
## 2020-02-08 NOTE — Assessment & Plan Note (Signed)
The nature of choroidal nevus was discussed with the patient and an informational form was offered.  Photo documentation was discussed with the patient.  Periodic follow-up may be needed for a lifetime. The patient's questions were answered. At minimum, annual exams will be needed if no signs of early growth.  Small without any high risk features, macula OD will continue to observe follow-up in 6 months

## 2020-02-15 ENCOUNTER — Ambulatory Visit (INDEPENDENT_AMBULATORY_CARE_PROVIDER_SITE_OTHER): Payer: BC Managed Care – PPO | Admitting: Psychiatry

## 2020-02-15 ENCOUNTER — Encounter: Payer: Self-pay | Admitting: Psychiatry

## 2020-02-15 ENCOUNTER — Other Ambulatory Visit: Payer: Self-pay

## 2020-02-15 DIAGNOSIS — F39 Unspecified mood [affective] disorder: Secondary | ICD-10-CM
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
## 2020-02-22 DIAGNOSIS — R1032 Left lower quadrant pain: Secondary | ICD-10-CM | POA: Diagnosis not present

## 2020-02-22 DIAGNOSIS — Z113 Encounter for screening for infections with a predominantly sexual mode of transmission: Secondary | ICD-10-CM | POA: Diagnosis not present

## 2020-03-07 ENCOUNTER — Other Ambulatory Visit: Payer: Self-pay

## 2020-03-07 ENCOUNTER — Ambulatory Visit (INDEPENDENT_AMBULATORY_CARE_PROVIDER_SITE_OTHER): Payer: BC Managed Care – PPO | Admitting: Psychiatry

## 2020-03-07 ENCOUNTER — Encounter: Payer: Self-pay | Admitting: Psychiatry

## 2020-03-07 DIAGNOSIS — G4700 Insomnia, unspecified: Secondary | ICD-10-CM | POA: Diagnosis not present

## 2020-03-07 DIAGNOSIS — F332 Major depressive disorder, recurrent severe without psychotic features: Secondary | ICD-10-CM

## 2020-03-07 DIAGNOSIS — F411 Generalized anxiety disorder: Secondary | ICD-10-CM | POA: Diagnosis not present

## 2020-03-07 DIAGNOSIS — F410 Panic disorder [episodic paroxysmal anxiety] without agoraphobia: Secondary | ICD-10-CM

## 2020-03-07 MED ORDER — LAMOTRIGINE 100 MG PO TABS: ORAL_TABLET | ORAL | 0 refills | Status: DC

## 2020-03-07 MED ORDER — BUPROPION HCL ER (XL) 300 MG PO TB24: 300.0000 mg | ORAL_TABLET | Freq: Every day | ORAL | 0 refills | Status: DC
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
## 2020-03-10 ENCOUNTER — Telehealth: Payer: Self-pay | Admitting: Psychiatry

## 2020-03-10 NOTE — Telephone Encounter (Signed)
She reports that she has been more irritable today and has been "snapping" today and is having to distance from others. Denies any triggers to mood changes. Denies SI.   She confirms that she is taking Wellbutrin XL 300 mg (still taking two of her remaining Wellbutrin XL 150 mg tabs).   Discussed decreasing Lamictal to 50 mg po qd for the next few days to determine if this is contributing to irritability. Discussed considering re-trial of increased dose in the future. Pt to call back if s/s worsen.

## 2020-03-10 NOTE — Telephone Encounter (Signed)
Rebecca Holt called because you doubled her lamictal last week.  Today she is very agitated and irritable to the point that she is taking other meds to help calm her down.  Please call to discuss what to do?  Is the increase in the Lamictal or could it be something else?

## 2020-03-15 DIAGNOSIS — M5416 Radiculopathy, lumbar region: Secondary | ICD-10-CM | POA: Diagnosis not present

## 2020-03-15 DIAGNOSIS — M48061 Spinal stenosis, lumbar region without neurogenic claudication: Secondary | ICD-10-CM | POA: Diagnosis not present

## 2020-03-15 DIAGNOSIS — F329 Major depressive disorder, single episode, unspecified: Secondary | ICD-10-CM | POA: Diagnosis not present

## 2020-03-15 DIAGNOSIS — M545 Low back pain: Secondary | ICD-10-CM | POA: Diagnosis not present

## 2020-03-15 DIAGNOSIS — M533 Sacrococcygeal disorders, not elsewhere classified: Secondary | ICD-10-CM | POA: Diagnosis not present

## 2020-03-20 ENCOUNTER — Ambulatory Visit: Payer: BC Managed Care – PPO | Admitting: Family Medicine

## 2020-03-20 ENCOUNTER — Encounter: Payer: Self-pay | Admitting: Family Medicine

## 2020-03-20 ENCOUNTER — Other Ambulatory Visit: Payer: Self-pay

## 2020-03-20 VITALS — BP 120/86 | HR 80 | Temp 98.3°F | Resp 16 | Ht 68.0 in | Wt 192.0 lb

## 2020-03-20 DIAGNOSIS — E039 Hypothyroidism, unspecified: Secondary | ICD-10-CM | POA: Diagnosis not present

## 2020-03-20 DIAGNOSIS — F339 Major depressive disorder, recurrent, unspecified: Secondary | ICD-10-CM | POA: Diagnosis not present

## 2020-03-20 DIAGNOSIS — F411 Generalized anxiety disorder: Secondary | ICD-10-CM

## 2020-03-20 DIAGNOSIS — F410 Panic disorder [episodic paroxysmal anxiety] without agoraphobia: Secondary | ICD-10-CM | POA: Insufficient documentation

## 2020-03-20 DIAGNOSIS — Z23 Encounter for immunization: Secondary | ICD-10-CM

## 2020-03-20 DIAGNOSIS — Z981 Arthrodesis status: Secondary | ICD-10-CM

## 2020-03-20 NOTE — Progress Notes (Signed)
Subjective  CC:  Chief Complaint  Patient presents with  . New Patient (Initial Visit)    Dr. Maudie Mercury with [facility]    HPI: Rebecca Holt is a 34 y.o. female is a former [place] patient and is here to reestablish care with me today.    She has the following concerns or needs:  I last saw Lolitha in 2015.  Notes reviewed.  I reviewed recent psychiatry notes, OB/GYN notes.  Need to get records from her current OB/GYN and PCP.  She reports she had a female wellness physical in February.  She reports her Pap smears are up-to-date.  She is a stay-at-home mom, she has a 68 year old, 42-year-old and 42-year-old, all sons.  Overall, she is doing very well.  Past medical history is significant for lumbar foraminal stenosis and chronic back pain.  She had spinal fusion and is doing much better.  She does have mood disorder and is managed by psychiatry.  Recent medication changes have helped.  Hypothyroidism which she says is well controlled.  She has no symptoms of high thyroid.  I need to get old records.  Health maintenance: She is due a flu shot.  She does not need birth control as her husband has had a vasectomy.  She has no acute concerns today.  Assessment  1. Acquired hypothyroidism   2. Major depression, recurrent, chronic ([place])   3. GAD (generalized anxiety disorder)   4. Need for immunization against influenza   5. Status post lumbar spinal fusion      Plan   Hypothyroidism: Clinically stable.  We will get records from chart.  Continue daily levothyroxine.  Mood disorder: Improved control.  Continue with psychiatry.  History of chronic back pain status post lumbar spinal fusion: Discussed posture changes and back muscle strengthening exercises.  Flu shot updated today.  Follow up: Return in 6 to 9 months for complete physical  Orders Placed This Encounter  Procedures  . Flu Vaccine QUAD 36+ mos IM   No orders of the defined types were placed in this  encounter.     We updated and reviewed the patient's past history in detail and it is documented below.  Patient Active Problem List   Diagnosis Date Noted  . Panic attacks 03/20/2020    Priority: High  . GAD (generalized anxiety disorder) 03/10/2019    Priority: High  . OCD (obsessive compulsive disorder) 03/10/2019    Priority: High  . Major depression, recurrent, chronic ([place]) 02/08/2019    Priority: High  . Acquired hypothyroidism 08/02/2010    Priority: High  . 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 Lumbar foraminal stenosis 05/07/2019    Priority: Medium  . Chronic tonsillitis 11/16/2018    Priority: Medium  . Status post lumbar spinal fusion 03/20/2020   Health Maintenance  Topic Date Due  . Hepatitis C Screening  Never done  . TETANUS/TDAP  04/05/2021  . PAP SMEAR-Modifier  08/02/2022  . INFLUENZA VACCINE  Completed  . COVID-19 Vaccine  Completed  . HIV Screening  Completed   Immunization History  Administered Date(s) Administered  . Influenza,inj,Quad PF,6+ Mos 03/20/2020  . Janssen (J&J) SARS-COV-2 Vaccination 01/18/2020  . Tdap 04/06/2011   Current Meds  Medication Sig  . buPROPion (WELLBUTRIN XL) 300 MG 24 hr tablet Take 1 tablet (300 mg total) by mouth daily.  Marland Kitchen lamoTRIgine (LAMICTAL) 100 MG tablet Take 1 tab po qd x 2 weeks, then increase to  1.5 tabs po qd  . LORazepam (ATIVAN) 1 MG tablet Take one tablet at bedtime as needed for sleep, and 1/2 tablet during the day prn anxiety    Allergies: Patient is allergic to tramadol and latex. Past Medical History Patient  has a past medical history of Anxiety, Bipolar disorder ([place]), Chicken pox, Depressive disorder (02/08/2019), Hypothyroidism, and Pneumonia. Past Surgical History Patient  has a past surgical history that includes Tonsillectomy (Bilateral) and Transforaminal lumbar interbody fusion (tlif) with pedicle screw fixation 1 level (N/A, 05/07/2019). Family History: Patient  family history includes Bipolar disorder in her mother and son; Cancer in her maternal grandfather and paternal grandfather; Diabetes in her father and paternal grandfather; Diabetic kidney disease in her father; Hyperlipidemia in her father; Mental illness in her mother; Thyroid cancer in her maternal grandmother. Social History:  Patient  reports that she has never smoked. She has never used smokeless tobacco. She reports that she does not drink alcohol and does not use drugs.  Review of Systems: Constitutional: negative for fever or malaise Ophthalmic: negative for photophobia, double vision or loss of vision Cardiovascular: negative for chest pain, dyspnea on exertion, or new LE swelling Respiratory: negative for SOB or persistent cough Gastrointestinal: negative for abdominal pain, change in bowel habits or melena Genitourinary: negative for dysuria or gross hematuria Musculoskeletal: negative for new gait disturbance or muscular weakness Integumentary: negative for new or persistent rashes Neurological: negative for TIA or stroke symptoms Psychiatric: negative for SI or delusions Allergic/Immunologic: negative for hives  Patient Care Team    Relationship Specialty Notifications Start End  Leamon Arnt, MD PCP - General Family Medicine  03/20/20   Erline Levine, MD Consulting Physician Neurosurgery  03/20/20   Melissa Montane, MD Consulting Physician Otolaryngology  03/20/20     Objective  Vitals: BP 120/86   Pulse 80   Temp 98.3 F (36.8 C) (Temporal)   Resp 16   Ht 5\' 8"  (1.727 m)   Wt 192 lb (87.1 kg)   SpO2 98%   BMI 29.19 kg/m  General:  Well developed, well nourished, no acute distress  Psych:  Alert and oriented,normal mood and affect HEENT:  Normocephalic, atraumatic, non-icteric sclera, PERRL, oropharynx is without mass or exudate, supple neck without adenopathy, mass or thyromegaly Cardiovascular:  RRR without gallop, rub or murmur, nondisplaced PMI Respiratory:   Good breath sounds bilaterally, CTAB with normal respiratory effort Neurologic:    Mental status is normal.   Commons side effects, risks, benefits, and alternatives for medications and treatment plan prescribed today were discussed, and the patient expressed understanding of the given instructions. Patient is instructed to call or message via MyChart if he/she has any questions or concerns regarding our treatment plan. No barriers to understanding were identified. We discussed Red Flag symptoms and signs in detail. Patient expressed understanding regarding what to do in case of urgent or emergency type symptoms.   Medication list was reconciled, printed and provided to the patient in AVS. Patient instructions and summary information was reviewed with the patient as documented in the AVS. This note was prepared with assistance of Dragon voice recognition software. Occasional wrong-word or sound-a-like substitutions may have occurred due to the inherent limitations of voice recognition software

## 2020-03-20 NOTE — Patient Instructions (Signed)
It was so good seeing you again! Thank you for establishing with my new practice and allowing me to continue caring for you. It means a lot to me.   Please schedule a follow up appointment with me next year for your annual complete physical; please come fasting.  Today you were given your flu vaccination.

## 2020-03-22 ENCOUNTER — Telehealth: Payer: Self-pay | Admitting: Psychiatry

## 2020-03-22 NOTE — Telephone Encounter (Signed)
Please review

## 2020-03-22 NOTE — Telephone Encounter (Signed)
Pt reports that using the Ativan does not give her much relief.Feels she needs to take more than recommended to feel a difference. She is also having trouble sleeping. She is exhausted some times and cannot sleep.

## 2020-03-23 ENCOUNTER — Telehealth: Payer: Self-pay | Admitting: Psychiatry

## 2020-03-23 NOTE — Telephone Encounter (Signed)
Error

## 2020-03-23 NOTE — Telephone Encounter (Signed)
Talked to pt. Offered Friday 9/24 but has no childcare.

## 2020-03-24 ENCOUNTER — Telehealth (INDEPENDENT_AMBULATORY_CARE_PROVIDER_SITE_OTHER): Payer: BC Managed Care – PPO | Admitting: Psychiatry

## 2020-03-24 ENCOUNTER — Encounter: Payer: Self-pay | Admitting: Psychiatry

## 2020-03-24 DIAGNOSIS — F332 Major depressive disorder, recurrent severe without psychotic features: Secondary | ICD-10-CM

## 2020-03-24 DIAGNOSIS — F410 Panic disorder [episodic paroxysmal anxiety] without agoraphobia: Secondary | ICD-10-CM

## 2020-03-24 DIAGNOSIS — F411 Generalized anxiety disorder: Secondary | ICD-10-CM | POA: Diagnosis not present

## 2020-03-24 DIAGNOSIS — G4700 Insomnia, unspecified: Secondary | ICD-10-CM | POA: Diagnosis not present

## 2020-03-24 DIAGNOSIS — F39 Unspecified mood [affective] disorder: Secondary | ICD-10-CM

## 2020-03-24 MED ORDER — ALPRAZOLAM 0.5 MG PO TABS: ORAL_TABLET | ORAL | 0 refills | Status: DC

## 2020-03-24 MED ORDER — OXCARBAZEPINE 300 MG PO TABS: ORAL_TABLET | ORAL | 1 refills | Status: DC

## 2020-03-24 MED ORDER — LAMOTRIGINE 25 MG PO TABS: 75.0000 mg | ORAL_TABLET | Freq: Every day | ORAL | 1 refills | Status: DC

## 2020-03-24 NOTE — Telephone Encounter (Signed)
I scheduled her for phone appt Friday.
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
## 2020-04-02 ENCOUNTER — Other Ambulatory Visit: Payer: Self-pay | Admitting: Psychiatry

## 2020-04-02 DIAGNOSIS — F332 Major depressive disorder, recurrent severe without psychotic features: Secondary | ICD-10-CM

## 2020-04-03 ENCOUNTER — Other Ambulatory Visit: Payer: Self-pay

## 2020-04-03 ENCOUNTER — Ambulatory Visit (INDEPENDENT_AMBULATORY_CARE_PROVIDER_SITE_OTHER): Payer: BC Managed Care – PPO | Admitting: Psychiatry

## 2020-04-03 ENCOUNTER — Encounter: Payer: Self-pay | Admitting: Psychiatry

## 2020-04-03 DIAGNOSIS — F39 Unspecified mood [affective] disorder: Secondary | ICD-10-CM
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
## 2020-04-04 ENCOUNTER — Ambulatory Visit: Payer: BC Managed Care – PPO | Admitting: Psychiatry

## 2020-04-11 ENCOUNTER — Ambulatory Visit: Payer: BC Managed Care – PPO | Admitting: Adult Health

## 2020-04-12 ENCOUNTER — Telehealth: Payer: Self-pay

## 2020-04-12 NOTE — Telephone Encounter (Signed)
Prior authorization submitted and approved for LAMOTRIGINE 25 MG #90/30 DAYS effective 10/063/2021-04/05/2021 with [place]

## 2020-04-17 ENCOUNTER — Ambulatory Visit (INDEPENDENT_AMBULATORY_CARE_PROVIDER_SITE_OTHER): Payer: BC Managed Care – PPO | Admitting: Psychiatry

## 2020-04-17 ENCOUNTER — Encounter: Payer: Self-pay | Admitting: Psychiatry

## 2020-04-17 ENCOUNTER — Other Ambulatory Visit: Payer: Self-pay

## 2020-04-17 DIAGNOSIS — F39 Unspecified mood [affective] disorder: Secondary | ICD-10-CM | POA: Diagnosis not present

## 2020-04-17 NOTE — Progress Notes (Signed)
      [facility] Counselor/Therapist Progress Note  Patient ID: Shir Bergman, MRN: [id number],    Date: 04/17/2020  Time Spent: 50 minutes   Treatment Type: Individual Therapy  Reported Symptoms: sad, anxious  Mental Status Exam:  Appearance:   Casual     Behavior:  Appropriate  Motor:  Normal  Speech/Language:   Clear and Coherent  Affect:  Appropriate  Mood:  anxious and sad  Thought process:  normal  Thought content:    WNL  Sensory/Perceptual disturbances:    WNL  Orientation:  oriented to person, place, time/date and situation  Attention:  Good  Concentration:  Good  Memory:  WNL  Fund of knowledge:   Good  Insight:    Good  Judgment:   Good  Impulse Control:  Good   Risk Assessment: Danger to Self:  No Self-injurious Behavior: No Danger to Others: No Duty to Warn:no Physical Aggression / Violence:No  Access to Firearms a concern: No  Gang Involvement:No   Subjective: The client states that she has been having difficulty with her mother.  She describes a history of her mom retaliating against her if she gets angry with the client.  She described one event where her mother and father allied themselves with the father of the client's first child.  They testified on his behalf in court that the client was an unfit mother.  The judge did not believe that the client was unfit and she retained custody.  Today I used eye-movement focusing on the client's mom, dad, her current husband and her sister.  The negative cognition is, "their love is not unconditional."  She feels sadness in her chest.  Her subjective units of distress is a 9.  As the client processed she focused mainly on her mother and her mother's negativity.  The client pointed out that her mother lives a life of bitter hopelessness.  She talks with her mother each day but is discouraged by the negativity.  We discussed the fact that the client has no control over her mother or any other relationships.  The client  agreed.  The only thing she could do was to take care of herself.  I suggested that she might want to distance herself carefully from her mother by shortening phone times and possibly using text messaging instead.  The client thought this would be good.  As she continued to process the client used the bilateral stimulation hand paddles.  She visualized herself at her house and invited Jesus as a resource into that picture.  She realized that there really was only so much she could do.  She would have to turn the rest over to the care of God.  She felt calm.  Her subjective units of distress was less than 3.  Her positive cognition at the end of the session was was, "I can only control myself."  Interventions: Assertiveness/Communication, Motivational Interviewing, Solution-Oriented/Positive Psychology, [employer] Group Desensitization and Reprocessing (EMDR) and Insight-Oriented  Diagnosis:   ICD-10-CM   1. Episodic mood disorder ([place])  F39     Plan: Mood independent behavior, careful distancing with mom, positive self talk, self-care, boundaries, assertiveness, mindful prayer  Clarita Leber, [facility]

## 2020-04-20 DIAGNOSIS — M461 Sacroiliitis, not elsewhere classified: Secondary | ICD-10-CM | POA: Diagnosis not present

## 2020-04-24 ENCOUNTER — Ambulatory Visit (INDEPENDENT_AMBULATORY_CARE_PROVIDER_SITE_OTHER): Payer: BC Managed Care – PPO | Admitting: Psychiatry

## 2020-04-24 ENCOUNTER — Other Ambulatory Visit: Payer: Self-pay

## 2020-04-24 ENCOUNTER — Encounter: Payer: Self-pay | Admitting: Psychiatry

## 2020-04-24 DIAGNOSIS — F410 Panic disorder [episodic paroxysmal anxiety] without agoraphobia: Secondary | ICD-10-CM

## 2020-04-24 DIAGNOSIS — F411 Generalized anxiety disorder: Secondary | ICD-10-CM

## 2020-04-24 DIAGNOSIS — G4700 Insomnia, unspecified: Secondary | ICD-10-CM

## 2020-04-24 DIAGNOSIS — F332 Major depressive disorder, recurrent severe without psychotic features: Secondary | ICD-10-CM | POA: Diagnosis not present

## 2020-04-24 DIAGNOSIS — F39 Unspecified mood [affective] disorder: Secondary | ICD-10-CM

## 2020-04-24 MED ORDER — ALPRAZOLAM 0.5 MG PO TABS: ORAL_TABLET | ORAL | 1 refills | Status: DC

## 2020-04-24 MED ORDER — OXCARBAZEPINE 300 MG PO TABS: 450.0000 mg | ORAL_TABLET | Freq: Every day | ORAL | 1 refills | Status: DC

## 2020-04-24 MED ORDER — BUPROPION HCL ER (XL) 300 MG PO TB24: 300.0000 mg | ORAL_TABLET | Freq: Every day | ORAL | 0 refills | Status: DC

## 2020-04-24 MED ORDER — LAMOTRIGINE 25 MG PO TABS: 75.0000 mg | ORAL_TABLET | Freq: Every day | ORAL | 1 refills | Status: DC

## 2020-04-24 NOTE — Progress Notes (Signed)
Rebecca Holt [id number] [birth year] 34 y.o.  Subjective:   Patient ID:  Rebecca Holt is a 34 y.o. (DOB 01-03-1986) female.  Chief Complaint:  Chief Complaint  Patient presents with  . Follow-up    Anxiety, insomnia, mood disturbance    HPI Rebecca Holt presents to the office today for follow-up of mood disturbance, anxiety, and insomnia. Rebecca Holt reports that Rebecca Holt is sleeping better. Rebecca Holt reports that Rebecca Holt is taking Xanax to be able to sleep and has not needed it during the day.Sleeping about 6-7 hours a night.  Rebecca Holt reports that Rebecca Holt did not experience any benzodiazepine withdrawal with transition from Ativan to Xanax. Has been playing her guitar again and started playing when Rebecca Holt was 34 yo.   Rebecca Holt reports that her depression "is a lot better." Rebecca Holt reports that irritability is intense at times and other days feels that irritability is easier to manage. Has started therapy with Georgana Curio, [facility]. Rebecca Holt reports that her anxiety has been improving. Rebecca Holt has been trying to manage intrusive thoughts with therapy techniques. Denies any panic s/s. Rebecca Holt reports that Rebecca Holt has had a few moments of increased frustration/irritation without panic. Rebecca Holt reports that her worry has been decreased with resolution of several psychosocial stressors. Rebecca Holt reports that Rebecca Holt has been talking more than usual and that others have been pointing out that Rebecca Holt is talking more. Denies impulsive or risky behavior. Rebecca Holt reports that her energy has been ok. Rebecca Holt reports improved energy and motivation with improved pain after SI injections. Rebecca Holt reports that Rebecca Holt has some difficulty with focus. Rebecca Holt reports that Rebecca Holt has been able to complete some tasks that Rebecca Holt was not able to do previously. Continues to have difficulty retaining what Rebecca Holt has read and has difficulty remembering things, such as what Rebecca Holt has said or appointment dates. Denies SI.   Rebecca Holt reports that Rebecca Holt does not like large gatherings and parties. Rebecca Holt reports that Rebecca Holt has anticipatory anxiety about  parties and gatherings.   Has been working on setting boundaries with some family members.  Has had a cockatiel for 26 years and has other birds. Having trees removed today that were causing her anxiety about it falling on her house.    Past Psychiatric Medication Trials: Ativan- partially effective Tranxene          Wellbutrin XL- Increased activation/agitation with 450 mg. Reports that Wellbutrin seemed to initially be effective. Zoloft Lexapro Celexa- Had severe discontinuation Pristiq Abilify Olanzapine Trileptal Lamictal- Had increased irritability and insomnia with 100 mg dose.  Ambien  Review of Systems:  Review of Systems  Musculoskeletal: Negative for gait problem.       Had recent SI injections and reports that this has helped decrease her pain.   Neurological: Negative for dizziness and tremors.       Occ tension  Psychiatric/Behavioral:       Please refer to HPI    Medications: I have reviewed the patient's current medications.  Current Outpatient Medications  Medication Sig Dispense Refill  . ALPRAZolam (XANAX) 0.5 MG tablet Take 1 tablet twice daily prn anxiety 45 tablet 1  . buPROPion (WELLBUTRIN XL) 300 MG 24 hr tablet Take 1 tablet (300 mg total) by mouth daily. 90 tablet 0  . Oxcarbazepine (TRILEPTAL) 300 MG tablet Take 1.5 tablets (450 mg total) by mouth at bedtime. 45 tablet 1  . lamoTRIgine (LAMICTAL) 25 MG tablet Take 3 tablets (75 mg total) by mouth daily. 90 tablet 1   No current facility-administered medications for this visit.  Medication Side Effects: None  Allergies:  Allergies  Allergen Reactions  . Tramadol Itching  . Latex Other (See Comments)    Causes irritation.    Past Medical History:  Diagnosis Date  . Anxiety   . Bipolar disorder ([place])   . Chicken pox   . Depressive disorder 02/08/2019  . Hypothyroidism   . Pneumonia     Family History  Problem Relation Age of Onset  . Diabetic kidney disease Father   .  Hyperlipidemia Father   . Diabetes Father   . Bipolar disorder Mother   . Mental illness Mother        bipolar  . Cancer Paternal Grandfather        colon  . Diabetes Paternal Grandfather   . Thyroid cancer Maternal Grandmother   . Cancer Maternal Grandfather        leukemia  . Bipolar disorder Son   . Anesthesia problems Neg Hx   . Hypotension Neg Hx   . Malignant hyperthermia Neg Hx   . Pseudochol deficiency Neg Hx     Social History   Socioeconomic History  . Marital status: Married    Spouse name: Not on file  . Number of children: Not on file  . Years of education: Not on file  . Highest education level: Not on file  Occupational History  . Not on file  Tobacco Use  . Smoking status: Never Smoker  . Smokeless tobacco: Never Used  Vaping Use  . Vaping Use: Never used  Substance and Sexual Activity  . Alcohol use: No  . Drug use: No  . Sexual activity: Yes    Birth control/protection: None    Comment: tubal after   Other Topics Concern  . Not on file  Social History Narrative  . Not on file   Social Determinants of Health   Financial Resource Strain:   . Difficulty of Paying Living Expenses: Not on file  Food Insecurity:   . Worried About Charity fundraiser in the Last Year: Not on file  . Ran Out of Food in the Last Year: Not on file  Transportation Needs:   . Lack of Transportation (Medical): Not on file  . Lack of Transportation (Non-Medical): Not on file  Physical Activity:   . Days of Exercise per Week: Not on file  . Minutes of Exercise per Session: Not on file  Stress:   . Feeling of Stress : Not on file  Social Connections:   . Frequency of Communication with Friends and Family: Not on file  . Frequency of Social Gatherings with Friends and Family: Not on file  . Attends Religious Services: Not on file  . Active Member of Clubs or Organizations: Not on file  . Attends Archivist Meetings: Not on file  . Marital Status: Not on file   Intimate Partner Violence:   . Fear of Current or Ex-Partner: Not on file  . Emotionally Abused: Not on file  . Physically Abused: Not on file  . Sexually Abused: Not on file    Past Medical History, Surgical history, Social history, and Family history were reviewed and updated as appropriate.   Please see review of systems for further details on the patient's review from today.   Objective:   Physical Exam:  There were no vitals taken for this visit.  Physical Exam Constitutional:      General: Rebecca Holt is not in acute distress. Musculoskeletal:  General: No deformity.  Neurological:     Mental Status: Rebecca Holt is alert and oriented to person, place, and time.     Coordination: Coordination normal.  Psychiatric:        Attention and Perception: Attention and perception normal. Rebecca Holt does not perceive auditory or visual hallucinations.        Mood and Affect: Mood is anxious. Affect is not labile, blunt, angry or inappropriate.        Speech: Speech normal.        Behavior: Behavior normal.        Thought Content: Thought content normal. Thought content is not paranoid or delusional. Thought content does not include homicidal or suicidal ideation. Thought content does not include homicidal or suicidal plan.        Cognition and Memory: Cognition and memory normal.        Judgment: Judgment normal.     Comments: Insight intact Mood presents as less anxious and less depressed compared to last visit.     Lab Review:     Component Value Date/Time   NA 136 03/16/2019 0926   K 3.7 03/16/2019 0926   CL 102 03/16/2019 0926   CO2 22 03/16/2019 0926   GLUCOSE 94 03/16/2019 0926   BUN 8 03/16/2019 0926   CREATININE 0.68 03/16/2019 0926   CALCIUM 9.1 03/16/2019 0926   PROT 7.0 03/16/2019 0926   ALBUMIN 3.9 03/16/2019 0926   AST 21 03/16/2019 0926   ALT 22 03/16/2019 0926   ALKPHOS 55 03/16/2019 0926   BILITOT 0.6 03/16/2019 0926   GFRNONAA >60 03/16/2019 0926   GFRAA >60  03/16/2019 0926       Component Value Date/Time   WBC 6.8 05/04/2019 1044   RBC 4.40 05/04/2019 1044   HGB 12.1 05/04/2019 1044   HCT 38.3 05/04/2019 1044   PLT 299 05/04/2019 1044   MCV 87.0 05/04/2019 1044   MCH 27.5 05/04/2019 1044   MCHC 31.6 05/04/2019 1044   RDW 12.8 05/04/2019 1044   LYMPHSABS 1.6 03/16/2019 0926   MONOABS 0.4 03/16/2019 0926   EOSABS 0.1 03/16/2019 0926   BASOSABS 0.0 03/16/2019 0926    No results found for: POCLITH, LITHIUM   No results found for: PHENYTOIN, PHENOBARB, VALPROATE, CBMZ   .res Assessment: Plan:   Patient seen for 30 minutes and time spent discussing treatment plan and possible treatment options.  Discussed potential benefits, risks, and side effects of increasing Trileptal to 450 mg at bedtime since patient reports improved mood and anxiety with Trileptal.  Discussed that higher dose may further help with occasional mood lability and excessive talkativeness.  Patient agrees to increase in Trileptal.  Will increase Trileptal to 450 mg at bedtime. Continue lamotrigine 75 mg daily since patient reports that this dose is currently well tolerated and is helping with her mood and anxiety. Continue Wellbutrin XL 300 mg daily for depression. Continue alprazolam as needed. Recommend continuing psychotherapy with Georgana Curio, [facility]. Patient to follow-up with this provider in 6 weeks or sooner if clinically indicated. Patient advised to contact office with any questions, adverse effects, or acute worsening in signs and symptoms.  Rebecca Holt was seen today for follow-up.  Diagnoses and all orders for this visit:  Insomnia, unspecified type -     Oxcarbazepine (TRILEPTAL) 300 MG tablet; Take 1.5 tablets (450 mg total) by mouth at bedtime. -     ALPRAZolam (XANAX) 0.5 MG tablet; Take 1 tablet twice daily prn anxiety  Episodic mood  disorder ([place]) -     Oxcarbazepine (TRILEPTAL) 300 MG tablet; Take 1.5 tablets (450 mg total) by mouth at bedtime. -      lamoTRIgine (LAMICTAL) 25 MG tablet; Take 3 tablets (75 mg total) by mouth daily.  Generalized anxiety disorder -     Oxcarbazepine (TRILEPTAL) 300 MG tablet; Take 1.5 tablets (450 mg total) by mouth at bedtime.  Severe episode of recurrent major depressive disorder, without psychotic features ([place]) -     buPROPion (WELLBUTRIN XL) 300 MG 24 hr tablet; Take 1 tablet (300 mg total) by mouth daily.  Panic attacks -     ALPRAZolam (XANAX) 0.5 MG tablet; Take 1 tablet twice daily prn anxiety     Please see After Visit Summary for patient specific instructions.  Future Appointments  Date Time Provider [place]  05/01/2020 10:00 AM May, [place], [facility] CP-CP None  05/15/2020 11:00 AM May, [place], [facility] CP-CP None  05/29/2020 10:00 AM May, [place], [facility] CP-CP None  06/05/2020 10:00 AM Thayer Headings, PMHNP CP-CP None  06/13/2020 12:00 PM May, [place], [facility] CP-CP None  07/04/2020 11:00 AM May, [place], [facility] CP-CP None  07/18/2020 11:00 AM May, [place], [facility] CP-CP None  08/10/2020 10:45 AM [place], Clent Demark, MD RDE-RDE None    No orders of the defined types were placed in this encounter.   -------------------------------

## 2020-05-01 ENCOUNTER — Ambulatory Visit: Payer: BC Managed Care – PPO | Admitting: Psychiatry

## 2020-05-08 ENCOUNTER — Telehealth: Payer: Self-pay | Admitting: Psychiatry

## 2020-05-08 DIAGNOSIS — F411 Generalized anxiety disorder: Secondary | ICD-10-CM

## 2020-05-08 DIAGNOSIS — G4700 Insomnia, unspecified: Secondary | ICD-10-CM

## 2020-05-08 DIAGNOSIS — F39 Unspecified mood [affective] disorder: Secondary | ICD-10-CM

## 2020-05-08 NOTE — Telephone Encounter (Signed)
Pt called to report increase in Trileptal has caused pt to wake up very dizzy today. Room is spinning. Should pt decrease? Contact # [phone]. Apt 12/6

## 2020-05-08 NOTE — Telephone Encounter (Signed)
Please review

## 2020-05-10 ENCOUNTER — Telehealth: Payer: Self-pay | Admitting: Psychiatry

## 2020-05-10 NOTE — Telephone Encounter (Signed)
Rebecca Holt called to find out about the previous message from 11/8. I gave her the info but she does have a couple more questions. Can you please call her at [phone]. Thanks

## 2020-05-10 NOTE — Telephone Encounter (Signed)
See previous phone message. 

## 2020-05-10 NOTE — Telephone Encounter (Signed)
Rtc to patient and she did go ahead and decrease dose to 300 mg Trileptal starting Monday night. She was asking if the fluctuating moods was related to that? One minute she is crying the next irritable. Informed her I would check with Janett Billow and get back with her.

## 2020-05-15 ENCOUNTER — Ambulatory Visit (INDEPENDENT_AMBULATORY_CARE_PROVIDER_SITE_OTHER): Payer: BC Managed Care – PPO | Admitting: Psychiatry

## 2020-05-15 ENCOUNTER — Encounter: Payer: Self-pay | Admitting: Psychiatry

## 2020-05-15 ENCOUNTER — Other Ambulatory Visit: Payer: Self-pay

## 2020-05-15 DIAGNOSIS — F39 Unspecified mood [affective] disorder: Secondary | ICD-10-CM | POA: Diagnosis not present

## 2020-05-15 NOTE — Progress Notes (Signed)
°      [facility] Counselor/Therapist Progress Note  Patient ID: Rebecca Holt, MRN: [id number],    Date: 05/15/2020  Time Spent: 50 minutes   Treatment Type: Individual Therapy  Reported Symptoms: sadness, tearful  Mental Status Exam:  Appearance:   Well Groomed     Behavior:  Appropriate  Motor:  Normal  Speech/Language:   Clear and Coherent  Affect:  Tearful  Mood:  sad  Thought process:  normal  Thought content:    WNL  Sensory/Perceptual disturbances:    WNL  Orientation:  oriented to person, place, time/date and situation  Attention:  Good  Concentration:  Good  Memory:  WNL  Fund of knowledge:   Good  Insight:    Good  Judgment:   Good  Impulse Control:  Good   Risk Assessment: Danger to Self:  No Self-injurious Behavior: No Danger to Others: No Duty to Warn:no Physical Aggression / Violence:No  Access to Firearms a concern: No  Gang Involvement:No   Subjective: Client states that she auditioned to be on her churches worship team and was accepted.  This past Sunday the sermon at church was on depression.  She stated that her pastor talked about how terrible things can bring people to where they need to be spiritually.  "It made me cry."  Today the client states, "I feel down and overwhelmed."  I used eye-movement with the client focusing on what she was feeling.  As the client processed, she realized that she was grieving.  She has been overlooked and has sacrificed to meet everyone else's needs.  She realizes that she is not understood.  "No one has compassion for me."  We discussed her relationship with her husband especially.  She is deeply wounded and betrayed by his affair during his manic episode.  I encouraged the client to acknowledge her pain which she was able to do.  I explained that it was important that her husband understand that she needs to express her hurt to him and he needs to listen and accept.  We switched to the bilateral stimulation hand paddles.   The client did a visual with Jesus in her house.  She poured out her heart and all her pain into a large chest.  She was able to leave it with Jesus.  She realized that she needed to not fix others but to take care of herself.  At the end of the session the client's subjective units of distress went from an 8 to less than 2.  "I feel a lot of relief."  Interventions: Assertiveness/Communication, Motivational Interviewing, Solution-Oriented/Positive Psychology, Eye Movement Desensitization and Reprocessing (EMDR) and Insight-Oriented  Diagnosis:   ICD-10-CM   1. Episodic mood disorder ([place])  F39     Plan: Mood independent behavior, positive self talk, self-care, assertiveness, boundaries, conversations with husband.  Shantil Vallejo, [facility]

## 2020-05-16 MED ORDER — OXCARBAZEPINE 300 MG PO TABS: ORAL_TABLET | ORAL | 1 refills | Status: DC

## 2020-05-16 MED ORDER — ASENAPINE MALEATE 2.5 MG SL SUBL: 2.5000 mg | SUBLINGUAL_TABLET | Freq: Every day | SUBLINGUAL | 0 refills | Status: DC

## 2020-05-16 NOTE — Telephone Encounter (Signed)
Rtc to patient for follow up, she's been taking the 300 mg Trileptal for about 6-7 days. She doesn't feel like it is benefiting her at all. She is still having trouble sleeping, but if she takes the xanax that helps her sleep.  Asking if she should wean off the Trileptal or ? Still having the fluctuating moods.

## 2020-05-16 NOTE — Addendum Note (Signed)
Addended by: Sharyl Nimrod on: 05/16/2020 01:55 PM   Modules accepted: Orders

## 2020-05-22 DIAGNOSIS — M5416 Radiculopathy, lumbar region: Secondary | ICD-10-CM | POA: Diagnosis not present

## 2020-05-22 DIAGNOSIS — M461 Sacroiliitis, not elsewhere classified: Secondary | ICD-10-CM | POA: Diagnosis not present

## 2020-05-22 DIAGNOSIS — F329 Major depressive disorder, single episode, unspecified: Secondary | ICD-10-CM | POA: Diagnosis not present

## 2020-05-22 DIAGNOSIS — M545 Low back pain, unspecified: Secondary | ICD-10-CM | POA: Diagnosis not present

## 2020-05-29 ENCOUNTER — Encounter: Payer: Self-pay | Admitting: Psychiatry

## 2020-05-29 ENCOUNTER — Telehealth: Payer: Self-pay | Admitting: Psychiatry

## 2020-05-29 ENCOUNTER — Other Ambulatory Visit: Payer: Self-pay

## 2020-05-29 ENCOUNTER — Ambulatory Visit (INDEPENDENT_AMBULATORY_CARE_PROVIDER_SITE_OTHER): Payer: BC Managed Care – PPO | Admitting: Psychiatry

## 2020-05-29 DIAGNOSIS — F332 Major depressive disorder, recurrent severe without psychotic features: Secondary | ICD-10-CM

## 2020-05-29 DIAGNOSIS — F39 Unspecified mood [affective] disorder: Secondary | ICD-10-CM | POA: Diagnosis not present

## 2020-05-29 MED ORDER — BUPROPION HCL ER (XL) 150 MG PO TB24: 150.0000 mg | ORAL_TABLET | Freq: Every day | ORAL | 1 refills | Status: DC

## 2020-05-29 NOTE — Telephone Encounter (Signed)
Received message from pt's therapist that pt reported during therapy session that she is experiencing agitation, anger, insomnia and depressed mood with the increase in the Wellbutrin. She states, "I am either crying or yelling."   Advised that pt decrease Wellbutrin to previous dose. She indicated to therapist that she would need new script sent in.   Script sent for Wellbutrin XL 150 mg po qd.

## 2020-05-29 NOTE — Progress Notes (Signed)
      [facility] Counselor/Therapist Progress Note  Patient ID: Rebecca Holt, MRN: [id number],    Date: 05/29/2020  Time Spent: 50 minutes   Treatment Type: Individual Therapy  Reported Symptoms: agitated, irritable  Mental Status Exam:  Appearance:   Casual     Behavior:  Appropriate  Motor:  Normal  Speech/Language:   Clear and Coherent  Affect:  Appropriate  Mood:  irritable  Thought process:  normal  Thought content:    WNL  Sensory/Perceptual disturbances:    WNL  Orientation:  oriented to person, place, time/date and situation  Attention:  Good  Concentration:  Good  Memory:  WNL  Fund of knowledge:   Good  Insight:    Good  Judgment:   Good  Impulse Control:  Good   Risk Assessment: Danger to Self:  No Self-injurious Behavior: No Danger to Others: No Duty to Warn:no Physical Aggression / Violence:No  Access to Firearms a concern: No  Gang Involvement:No   Subjective: The client states that she has been agitated and angry more recently.  "I cannot sleep.  I am either crying or yelling."  The client had recently increased her Wellbutrin to 300 mg.  She is also taking Lamictal as well.  I direct messaged Thayer Headings, PMHNP about what the client was experiencing.  She has recommended that the client go down to the lower dose of Wellbutrin.  I communicated this to the client and she agreed. Today I started with eye-movement focusing on the client's agitation and irritability that she feels.  She notes that it is "all over."  Her subjective units of distress is a 9.  As the client processed she stated, "I have brought all this on myself."  She recently acquired 2 new parrots which brings her total to 4.  They are noisy if she becomes more agitated which makes the client even more agitated.  "I need to get myself in check."  The client also finds herself lashing out at her husband for not being able to read her mind.  She realizes this is inappropriate.  The client states  that thoughts come up from 20 years ago around things she is upset with herself.  We discussed the need for self forgiveness.  The client agrees that she has not done this.  As she continued to process her subjective units of distress began to drop down.  I used the bilateral stimulation hand paddles.  The client visualized bringing Jesus in as a resource to help with her self forgiveness.  She was able to do so.  Her subjective units of distress is at a 3.  I also gave the client a handout on, "6 steps to beating depression ".  Which she will review.  Her positive cognition at the end of the session was, "I can be okay."  Interventions: Assertiveness/Communication, Motivational Interviewing, Solution-Oriented/Positive Psychology, [employer] Group Desensitization and Reprocessing (EMDR) and Insight-Oriented  Diagnosis:   ICD-10-CM   1. Episodic mood disorder ([place])  F39     Plan: Mood independent behavior, positive self talk, self care, review 6 steps to beating depression, forgiveness of self, mindful prayer.  Hinton Luellen, [facility]

## 2020-05-31 ENCOUNTER — Telehealth: Payer: Self-pay

## 2020-05-31 DIAGNOSIS — F39 Unspecified mood [affective] disorder: Secondary | ICD-10-CM

## 2020-05-31 DIAGNOSIS — G4700 Insomnia, unspecified: Secondary | ICD-10-CM

## 2020-05-31 NOTE — Telephone Encounter (Signed)
Received call from patient, she reports she is just not doing any better. She did decrease her Wellbutrin on Monday per previous note. She feels she is in a downward spiral. She's super down and moods are all over the place. She's been taking the Xanax at night to calm down and sleep but then wakes up with a hangover feeling and headache. She has apt on Monday but didn't know if something could be changed prior to that. She feels so bad.   She mentioned Josph Macho discussed [employer] testing with her and wondered if that was an option?  Informed her I would update Janett Billow and we would follow up.

## 2020-06-01 MED ORDER — REXULTI 0.5 MG PO TABS: 0.5000 mg | ORAL_TABLET | Freq: Every day | ORAL | 0 refills | Status: DC

## 2020-06-01 NOTE — Telephone Encounter (Addendum)
Returned call to pt. She reports that she did not start Saphris. She reports that antidepressant medications seem to help for a period of time and then no longer work. She reports that she feels "very agitated, irritated." Anxiety has been elevated. She reports that she has been tearful and not enjoying things she normally enjoys. She reports that she has had some passive death wishes. She reports poor focus- "feel like I have 100 things going on my mind at one time." Denies SI.   She reports that her son has been responding well to [facility]. Discussed potential benefits, risks, and side effects of Rexulti. Will start Rexulti 0.5 mg po qd.

## 2020-06-05 ENCOUNTER — Encounter: Payer: Self-pay | Admitting: Psychiatry

## 2020-06-05 ENCOUNTER — Other Ambulatory Visit: Payer: Self-pay

## 2020-06-05 ENCOUNTER — Ambulatory Visit (INDEPENDENT_AMBULATORY_CARE_PROVIDER_SITE_OTHER): Payer: BC Managed Care – PPO | Admitting: Psychiatry

## 2020-06-05 DIAGNOSIS — F332 Major depressive disorder, recurrent severe without psychotic features: Secondary | ICD-10-CM | POA: Diagnosis not present

## 2020-06-05 DIAGNOSIS — F411 Generalized anxiety disorder: Secondary | ICD-10-CM | POA: Diagnosis not present

## 2020-06-05 DIAGNOSIS — F39 Unspecified mood [affective] disorder: Secondary | ICD-10-CM

## 2020-06-05 DIAGNOSIS — F429 Obsessive-compulsive disorder, unspecified: Secondary | ICD-10-CM

## 2020-06-05 MED ORDER — RISPERIDONE 0.5 MG PO TABS: ORAL_TABLET | ORAL | 1 refills | Status: DC

## 2020-06-05 MED ORDER — BUPROPION HCL ER (XL) 150 MG PO TB24: 150.0000 mg | ORAL_TABLET | Freq: Every day | ORAL | 1 refills | Status: DC

## 2020-06-05 MED ORDER — LAMOTRIGINE 25 MG PO TABS: 75.0000 mg | ORAL_TABLET | Freq: Every day | ORAL | 1 refills | Status: DC

## 2020-06-05 NOTE — Progress Notes (Signed)
   06/05/20 1037  Facial and Oral Movements  Muscles of Facial Expression 0  Lips and Perioral Area 0  Jaw 0  Tongue 0  Extremity Movements  Upper (arms, wrists, hands, fingers) 0  Lower (legs, knees, ankles, toes) 0  Trunk Movements  Neck, shoulders, hips 0  Overall Severity  Severity of abnormal movements (highest score from questions above) 0  Incapacitation due to abnormal movements 0  Patient's awareness of abnormal movements (rate only patient's report) 0  AIMS Total Score  AIMS Total Score 0

## 2020-06-05 NOTE — Progress Notes (Signed)
Rebecca Holt [id number] [birth year] 34 y.o.  Subjective:   Patient ID:  Rebecca Holt is a 34 y.o. (DOB 1986/03/04) female.  Chief Complaint:  Chief Complaint  Patient presents with   Anxiety   Sleeping Problem   Depression    HPI Rebecca Holt presents to the office today for follow-up of anxiety, mood disturbance, and sleep disturbance. She reports that she was able to start Rexulti samples and reports that her insurance is not going to cover a significant amount of it. Has not noticed any side effects with Rexulti. Would like to come off Rexulti due to cost.   She reports that she has been less anxious and less angry since decrease in Wellbutrin XL. Questions if she is getting any benefit from Wellbutrin XL. "My anger has gone down a lot." Less anxious.Continued rumination and intrusive thoughts. She reports that her depression has been "about the same." Denies any impulsivity or risky behavior. She reports that she has to take Xanax 0.5 mg po QHS to fall asleep. Sleeping about 6 hours a night. Denies nightmares. Motivation has been low. Energy is fair- "I can do what I need to do." She reports poor concentration. She reports that she will start a task and then not complete it. She will walk into a room and forget why she went in. Occ feels like if she disappeared no one would notice. Denies SI.  AIMS   [place] Office Visit from 06/05/2020 in [facility] Total Score 0      Has had increased difficulty dealing with psychosocial stressors. Reports that she will cry easily.   Past Psychiatric Medication Trials: Ativan- partially effective Tranxene Wellbutrin XL- Increased activation/agitation with 450 mg. Reports that Wellbutrin seemed to initially be effective. Zoloft Lexapro Celexa- Had severe discontinuation Pristiq Abilify Rexulti- Cost prohibitive Olanzapine Trileptal Lamictal- Had increased irritability and insomnia with 100 mg dose.   Ambien  Review of Systems:  Review of Systems  Musculoskeletal: Negative for gait problem.  Neurological: Negative for tremors.  Psychiatric/Behavioral:       Please refer to HPI    Medications: I have reviewed the patient's current medications.  Current Outpatient Medications  Medication Sig Dispense Refill   ALPRAZolam (XANAX) 0.5 MG tablet Take 1 tablet twice daily prn anxiety 45 tablet 1   buPROPion (WELLBUTRIN XL) 150 MG 24 hr tablet Take 1 tablet (150 mg total) by mouth daily. 30 tablet 1   lamoTRIgine (LAMICTAL) 25 MG tablet Take 3 tablets (75 mg total) by mouth daily. 90 tablet 1   risperiDONE (RISPERDAL) 0.5 MG tablet Take 1/2 tablet at bedtime for 5-7 nights, then increase to 1 tab po QHS 30 tablet 1   No current facility-administered medications for this visit.    Medication Side Effects: None  Allergies:  Allergies  Allergen Reactions   Tramadol Itching   Latex Other (See Comments)    Causes irritation.    Past Medical History:  Diagnosis Date   Anxiety    Bipolar disorder ([place])    Chicken pox    Depressive disorder 02/08/2019   Hypothyroidism    Pneumonia     Family History  Problem Relation Age of Onset   Diabetic kidney disease Father    Hyperlipidemia Father    Diabetes Father    Bipolar disorder Mother    Mental illness Mother        bipolar   Cancer Paternal Grandfather        colon  Diabetes Paternal Grandfather    Thyroid cancer Maternal Grandmother    Cancer Maternal Grandfather        leukemia   Bipolar disorder Son    Anesthesia problems Neg Hx    Hypotension Neg Hx    Malignant hyperthermia Neg Hx    Pseudochol deficiency Neg Hx     Social History   Socioeconomic History   Marital status: Married    Spouse name: Not on file   Number of children: Not on file   Years of education: Not on file   Highest education level: Not on file  Occupational History   Not on file  Tobacco Use   Smoking  status: Never Smoker   Smokeless tobacco: Never Used  Vaping Use   Vaping Use: Never used  Substance and Sexual Activity   Alcohol use: No   Drug use: No   Sexual activity: Yes    Birth control/protection: None    Comment: tubal after   Other Topics Concern   Not on file  Social History Narrative   Not on file   Social Determinants of Health   Financial Resource Strain: Not on file  Food Insecurity: Not on file  Transportation Needs: Not on file  Physical Activity: Not on file  Stress: Not on file  Social Connections: Not on file  Intimate Partner Violence: Not on file    Past Medical History, Surgical history, Social history, and Family history were reviewed and updated as appropriate.   Please see review of systems for further details on the patient's review from today.   Objective:   Physical Exam:  There were no vitals taken for this visit.  Physical Exam Constitutional:      General: She is not in acute distress. Musculoskeletal:        General: No deformity.  Neurological:     Mental Status: She is alert and oriented to person, place, and time.     Coordination: Coordination normal.  Psychiatric:        Attention and Perception: Attention and perception normal. She does not perceive auditory or visual hallucinations.        Mood and Affect: Mood is anxious and depressed. Affect is not labile, blunt, angry or inappropriate.        Speech: Speech normal.        Behavior: Behavior normal.        Thought Content: Thought content normal. Thought content is not paranoid or delusional. Thought content does not include homicidal or suicidal ideation. Thought content does not include homicidal or suicidal plan.        Cognition and Memory: Cognition and memory normal.        Judgment: Judgment normal.     Comments: Insight intact     Lab Review:     Component Value Date/Time   NA 136 03/16/2019 0926   K 3.7 03/16/2019 0926   CL 102 03/16/2019 0926    CO2 22 03/16/2019 0926   GLUCOSE 94 03/16/2019 0926   BUN 8 03/16/2019 0926   CREATININE 0.68 03/16/2019 0926   CALCIUM 9.1 03/16/2019 0926   PROT 7.0 03/16/2019 0926   ALBUMIN 3.9 03/16/2019 0926   AST 21 03/16/2019 0926   ALT 22 03/16/2019 0926   ALKPHOS 55 03/16/2019 0926   BILITOT 0.6 03/16/2019 0926   GFRNONAA >60 03/16/2019 0926   GFRAA >60 03/16/2019 0926       Component Value Date/Time   WBC 6.8 05/04/2019 1044  RBC 4.40 05/04/2019 1044   HGB 12.1 05/04/2019 1044   HCT 38.3 05/04/2019 1044   PLT 299 05/04/2019 1044   MCV 87.0 05/04/2019 1044   MCH 27.5 05/04/2019 1044   MCHC 31.6 05/04/2019 1044   RDW 12.8 05/04/2019 1044   LYMPHSABS 1.6 03/16/2019 0926   MONOABS 0.4 03/16/2019 0926   EOSABS 0.1 03/16/2019 0926   BASOSABS 0.0 03/16/2019 0926    No results found for: POCLITH, LITHIUM   No results found for: PHENYTOIN, PHENOBARB, VALPROATE, CBMZ   .res Assessment: Plan:    Discussed potential benefits, risks, and side effects of risperidone. Discussed potential metabolic side effects associated with atypical antipsychotics, as well as potential risk for movement side effects. Advised pt to contact office if movement side effects occur.  Patient agrees to trial of risperidone.  Will start risperidone 0.5 mg 1/2 tablet bedtime for 5-7 nights, then increase to 1 tablet at bedtime for mood signs and symptoms.  Discussed that risperidone is used off label for anxiety and intrusive thoughts. Continue lamotrigine 75 mg daily for mood signs and symptoms. Continue Wellbutrin XL 150 mg daily since she reports that anger and irritability have significantly improved with decrease in dose. Continue Xanax as needed for anxiety. Recommend continuing psychotherapy with Georgana Curio, [facility]. Patient to follow-up with this provider in 1 to 2 months or sooner if clinically indicated. Patient advised to contact office with any questions, adverse effects, or acute worsening in signs and  symptoms.   Jarah was seen today for anxiety, sleeping problem and depression.  Diagnoses and all orders for this visit:  Generalized anxiety disorder  Episodic mood disorder (HCC) -     risperiDONE (RISPERDAL) 0.5 MG tablet; Take 1/2 tablet at bedtime for 5-7 nights, then increase to 1 tab po QHS -     lamoTRIgine (LAMICTAL) 25 MG tablet; Take 3 tablets (75 mg total) by mouth daily.  Severe episode of recurrent major depressive disorder, without psychotic features ([place]) -     buPROPion (WELLBUTRIN XL) 150 MG 24 hr tablet; Take 1 tablet (150 mg total) by mouth daily.  Obsessive-compulsive disorder, unspecified type -     risperiDONE (RISPERDAL) 0.5 MG tablet; Take 1/2 tablet at bedtime for 5-7 nights, then increase to 1 tab po QHS     Please see After Visit Summary for patient specific instructions.  Future Appointments  Date Time Provider Delta  06/13/2020 12:00 PM May, [place], [facility] CP-CP None  07/04/2020 11:00 AM May, [place], [facility] CP-CP None  07/18/2020 11:00 AM May, [place], [facility] CP-CP None  07/31/2020 10:00 AM Thayer Headings, PMHNP CP-CP None  08/07/2020 11:00 AM May, [place], [facility] [facility] CP-CP None  08/10/2020 10:45 AM Rank[place], Clent Demark, MD RDE-RDE None  08/21/2020 10:00 AM MayAlbertina Parr, [facility] CP-CP None    No orders of the defined types were placed in this encounter.   -------------------------------

## 2020-06-09 ENCOUNTER — Telehealth: Payer: Self-pay | Admitting: Psychiatry

## 2020-06-09 NOTE — Telephone Encounter (Signed)
Mee called and said that the Risperdal she was put on is causing her mouth to twitch and heart palpitations. She started the medicine on 06/05/20. She took last one on 06/08/20. Next visit with Janett Billow is 07/31/20. Her number is [phone].

## 2020-06-09 NOTE — Telephone Encounter (Signed)
Rtc patient aware not to restart. Will keep follow up and call if symptoms worsen

## 2020-06-09 NOTE — Telephone Encounter (Signed)
Please review

## 2020-06-13 ENCOUNTER — Other Ambulatory Visit: Payer: Self-pay

## 2020-06-13 ENCOUNTER — Ambulatory Visit (INDEPENDENT_AMBULATORY_CARE_PROVIDER_SITE_OTHER): Payer: BC Managed Care – PPO | Admitting: Psychiatry

## 2020-06-13 DIAGNOSIS — F39 Unspecified mood [affective] disorder: Secondary | ICD-10-CM | POA: Diagnosis not present

## 2020-06-14 ENCOUNTER — Encounter: Payer: Self-pay | Admitting: Psychiatry

## 2020-06-14 NOTE — Progress Notes (Signed)
[facility] Counselor/Therapist Progress Note  Patient ID: Rebecca Holt, MRN: [id number],    Date: 06/13/2020  Time Spent: 50 minutes   Treatment Type: Individual Therapy  Reported Symptoms: anxious, sad, frustrated  Mental Status Exam:  Appearance:   Casual     Behavior:  Appropriate  Motor:  Normal  Speech/Language:   Clear and Coherent  Affect:  Appropriate  Mood:  anxious, irritable and sad  Thought process:  normal  Thought content:    WNL  Sensory/Perceptual disturbances:    WNL  Orientation:  oriented to person, place, time/date and situation  Attention:  Good  Concentration:  Good  Memory:  WNL  Fund of knowledge:   Good  Insight:    Good  Judgment:   Good  Impulse Control:  Good   Risk Assessment: Danger to Self:  No Self-injurious Behavior: No Danger to Others: No Duty to Warn:no Physical Aggression / Violence:No  Access to Firearms a concern: No  Gang Involvement:No   Subjective: The client states that she has been very stressed out recently.  Her 17 year old son who lives with her parents has been acting out at [facility].  "He cussed out a Pharmacist, hospital and then refused to leave the classroom.  He then proclaimed to everyone, I want shotgun to blow my brains out."  The police came and her son was taken to behavioral health.  The client states that she believes all of this was triggered by the fact that his phone had been taken away.  He has spent too much time on screens and not on homework.  The client's mother has been harassing her to get the phone back.  The client states that her mother is only interested in the short-term consequences and not taking the [place].  She points out that even when her son got his phone back he still ended up acting out and being taken to the hospital. All of this feeds into the client's relationship with her mother.  She feels her mother's love towards her is conditional.  I used eye-movement with the client focusing on this.   Her negative cognition is, "I am only loved conditionally."  She feels sad and frustrated.  Her subjective units of distress is a 6.  As she processed she realizes that she gives her mother way too much power because she over shares information.  We discussed the concept of selective authenticity which the client thinks is a good idea.  I asked the client if her mother has ever loved her the way she would have liked?  She says no.  I asked the client to consider detaching her expectations from her mother and stepping more into radical acceptance of who she really is?  The client agrees.  "I just need to love her anyway."  Her subjective units of distress was less than 2.  The client will also consider structured school programs for her son such as the [employer] or the [employer].  Interventions: Assertiveness/Communication, Motivational Interviewing, Solution-Oriented/Positive Psychology, [employer] Desensitization and Reprocessing (EMDR) and Insight-Oriented  Diagnosis:   ICD-10-CM   1. Episodic mood disorder (HCC)  F39     Plan: Explore alternate schooling for her oldest son such as the [employer], mood independent behavior, positive self talk, boundaries especially with mom, assertiveness, self-care, selective authenticity.  Rebecca Holt, [facility]

## 2020-07-04 ENCOUNTER — Encounter: Payer: Self-pay | Admitting: Psychiatry

## 2020-07-04 ENCOUNTER — Other Ambulatory Visit: Payer: Self-pay

## 2020-07-04 ENCOUNTER — Ambulatory Visit (INDEPENDENT_AMBULATORY_CARE_PROVIDER_SITE_OTHER): Payer: BC Managed Care – PPO | Admitting: Psychiatry

## 2020-07-04 DIAGNOSIS — F39 Unspecified mood [affective] disorder: Secondary | ICD-10-CM | POA: Diagnosis not present
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
## 2020-07-18 ENCOUNTER — Ambulatory Visit: Payer: BC Managed Care – PPO | Admitting: Psychiatry

## 2020-07-19 ENCOUNTER — Other Ambulatory Visit: Payer: Self-pay | Admitting: Psychiatry

## 2020-07-19 DIAGNOSIS — G4700 Insomnia, unspecified: Secondary | ICD-10-CM

## 2020-07-19 DIAGNOSIS — F410 Panic disorder [episodic paroxysmal anxiety] without agoraphobia: Secondary | ICD-10-CM

## 2020-07-21 ENCOUNTER — Ambulatory Visit: Payer: BC Managed Care – PPO | Admitting: Family Medicine

## 2020-07-31 ENCOUNTER — Telehealth (INDEPENDENT_AMBULATORY_CARE_PROVIDER_SITE_OTHER): Payer: BC Managed Care – PPO | Admitting: Psychiatry

## 2020-07-31 ENCOUNTER — Encounter: Payer: Self-pay | Admitting: Psychiatry

## 2020-07-31 ENCOUNTER — Telehealth (INDEPENDENT_AMBULATORY_CARE_PROVIDER_SITE_OTHER): Payer: BC Managed Care – PPO | Admitting: Physician Assistant

## 2020-07-31 ENCOUNTER — Encounter: Payer: Self-pay | Admitting: Physician Assistant

## 2020-07-31 ENCOUNTER — Other Ambulatory Visit: Payer: Self-pay

## 2020-07-31 ENCOUNTER — Telehealth: Payer: Self-pay | Admitting: Psychiatry

## 2020-07-31 VITALS — Ht 68.0 in | Wt 186.0 lb

## 2020-07-31 DIAGNOSIS — F332 Major depressive disorder, recurrent severe without psychotic features: Secondary | ICD-10-CM | POA: Diagnosis not present

## 2020-07-31 DIAGNOSIS — F411 Generalized anxiety disorder: Secondary | ICD-10-CM | POA: Diagnosis not present

## 2020-07-31 DIAGNOSIS — F39 Unspecified mood [affective] disorder: Secondary | ICD-10-CM | POA: Diagnosis not present

## 2020-07-31 DIAGNOSIS — F429 Obsessive-compulsive disorder, unspecified: Secondary | ICD-10-CM | POA: Diagnosis not present

## 2020-07-31 DIAGNOSIS — H9203 Otalgia, bilateral: Secondary | ICD-10-CM

## 2020-07-31 MED ORDER — LAMOTRIGINE 25 MG PO TABS: 75.0000 mg | ORAL_TABLET | Freq: Every day | ORAL | 2 refills | Status: DC

## 2020-07-31 MED ORDER — FLUTICASONE PROPIONATE 50 MCG/ACT NA SUSP: 2.0000 | Freq: Every day | NASAL | 2 refills | Status: DC

## 2020-07-31 MED ORDER — FLUCONAZOLE 150 MG PO TABS: 150.0000 mg | ORAL_TABLET | Freq: Once | ORAL | 0 refills | Status: AC

## 2020-07-31 MED ORDER — AMOXICILLIN 875 MG PO TABS: 875.0000 mg | ORAL_TABLET | Freq: Two times a day (BID) | ORAL | 0 refills | Status: DC

## 2020-07-31 MED ORDER — BUSPIRONE HCL 15 MG PO TABS: ORAL_TABLET | ORAL | 2 refills | Status: DC

## 2020-07-31 MED ORDER — BUPROPION HCL ER (XL) 150 MG PO TB24: 150.0000 mg | ORAL_TABLET | Freq: Every day | ORAL | 2 refills | Status: DC

## 2020-07-31 NOTE — Telephone Encounter (Signed)
Rebecca Holt, Rebecca Holt are scheduled for a virtual visit with your provider today.    Just as we do with appointments in the office, we must obtain your consent to participate.  Your consent will be active for this visit and any virtual visit you may have with one of our providers in the next 365 days.    If you have a MyChart account, I can also send a copy of this consent to you electronically.  All virtual visits are billed to your insurance company just like a traditional visit in the office.  As this is a virtual visit, video technology does not allow for your provider to perform a traditional examination.  This may limit your provider's ability to fully assess your condition.  If your provider identifies any concerns that need to be evaluated in person or the need to arrange testing such as labs, EKG, etc, we will make arrangements to do so.    Although advances in technology are sophisticated, we cannot ensure that it will always work on either your end or our end.  If the connection with a video visit is poor, we may have to switch to a telephone visit.  With either a video or telephone visit, we are not always able to ensure that we have a secure connection.   I need to obtain your verbal consent now.   Are you willing to proceed with your visit today?   Rebecca Holt has provided verbal consent on 07/31/2020 for a virtual visit (video or telephone).   Rebecca Holt, PMHNP 07/31/2020  10:18 AM
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
## 2020-07-31 NOTE — Progress Notes (Signed)
Virtual Visit via Video   I connected with Rebecca Holt on 07/31/20 at 12:30 PM EST by a video enabled telemedicine application and verified that I am speaking with the correct person using two identifiers. Location patient: Home Location provider: [facility], Office Persons participating in the virtual visit: Rebecca Holt, Inda Coke PA-C, Rebecca Pickler, LPN   I discussed the limitations of evaluation and management by telemedicine and the availability of in person appointments. The patient expressed understanding and agreed to proceed.  I acted as a Education administrator for [employer], PA-C [employer], LPN   Subjective:   HPI:   Otalgia Pt c/o bilateral ear pain, started a week ago went away and now pain started again 2 days ago, ears feel clogged, muffled sound. Denies fever or chills, no drainage, sore throat.  She has trialed allegra D without any significant improvement of symptoms.  Denies any sick contacts.   ROS: See pertinent positives and negatives per HPI.  Patient Active Problem List   Diagnosis Date Noted  . Panic attacks 03/20/2020  . 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 Lumbar foraminal stenosis 05/07/2019  . GAD (generalized anxiety disorder) 03/10/2019  . OCD (obsessive compulsive disorder) 03/10/2019  . Major depression, recurrent, chronic ([place]) 02/08/2019  . Chronic tonsillitis 11/16/2018  . Acquired hypothyroidism 08/02/2010    Social History   Tobacco Use  . Smoking status: Never Smoker  . Smokeless tobacco: Never Used  Substance Use Topics  . Alcohol use: No    Current Outpatient Medications:  .  ALPRAZolam (XANAX) 0.5 MG tablet, TAKE ONE TABLET BY MOUTH TWO TIMES A DAY AS NEEDED FOR ANXIETY, Disp: 45 tablet, Rfl: 1 .  amoxicillin (AMOXIL) 875 MG tablet, Take 1 tablet (875 mg total) by mouth 2 (two) times daily., Disp: 20 tablet, Rfl: 0 .  buPROPion (WELLBUTRIN XL) 150 MG 24 hr tablet,  Take 1 tablet (150 mg total) by mouth daily., Disp: 30 tablet, Rfl: 2 .  busPIRone (BUSPAR) 15 MG tablet, Take 1/3 tablet p.o. twice daily for 1 week, then take 2/3 tablet p.o. twice daily for 1 week, then take 1 tablet p.o. twice daily, Disp: 60 tablet, Rfl: 2 .  cholecalciferol (VITAMIN D3) 25 MCG (1000 UNIT) tablet, Take 1,000 Units by mouth daily., Disp: , Rfl:  .  Ferrous Sulfate (IRON PO), Take by mouth., Disp: , Rfl:  .  fluticasone (FLONASE) 50 MCG/ACT nasal spray, Place 2 sprays into both nostrils daily., Disp: 16 g, Rfl: 2 .  lamoTRIgine (LAMICTAL) 25 MG tablet, Take 3 tablets (75 mg total) by mouth daily., Disp: 90 tablet, Rfl: 2 .  Pyridoxine HCl (VITAMIN B6 PO), Take by mouth., Disp: , Rfl:   Allergies  Allergen Reactions  . Tramadol Itching  . Latex Other (See Comments)    Causes irritation.    Objective:   VITALS: Per patient if applicable, see vitals. GENERAL: Alert, appears well and in no acute distress. HEENT: Atraumatic, conjunctiva clear, no obvious abnormalities on inspection of external nose and ears. NECK: Normal movements of the head and neck. CARDIOPULMONARY: No increased WOB. Speaking in clear sentences. I:E ratio WNL.  MS: Moves all visible extremities without noticeable abnormality. PSYCH: Pleasant and cooperative, well-groomed. Speech normal rate and rhythm. Affect is appropriate. Insight and judgement are appropriate. Attention is focused, linear, and appropriate.  NEURO: CN grossly intact. Oriented as arrived to appointment on time with no prompting. Moves both UE equally.  SKIN: No obvious lesions, wounds, erythema, or cyanosis noted on face or hands.  Assessment and Plan:   [name] was seen today for ear pain.  Diagnoses and all orders for this visit:  Otalgia of both ears  Other orders -     amoxicillin (AMOXIL) 875 MG tablet; Take 1 tablet (875 mg total) by mouth 2 (two) times daily. -     fluticasone (FLONASE) 50 MCG/ACT nasal spray; Place 2  sprays into both nostrils daily.   Unable to assess due to virtual nature of visit. Suspect likely ETD. Trial flonase BID and continue Allegra D for a few days. If worsening or lack of improvement, may start oral amoxicillin per orders. Follow-up if any further concerns.  I discussed the assessment and treatment plan with the patient. The patient was provided an opportunity to ask questions and all were answered. The patient agreed with the plan and demonstrated an understanding of the instructions.   The patient was advised to call back or seek an in-person evaluation if the symptoms worsen or if the condition fails to improve as anticipated.   CMA or LPN served as scribe during this visit. History, Physical, and Plan performed by medical provider. The above documentation has been reviewed and is accurate and complete.  [place], [place] 07/31/2020

## 2020-08-07 ENCOUNTER — Ambulatory Visit (INDEPENDENT_AMBULATORY_CARE_PROVIDER_SITE_OTHER): Payer: BC Managed Care – PPO | Admitting: Psychiatry

## 2020-08-07 ENCOUNTER — Encounter: Payer: Self-pay | Admitting: Psychiatry

## 2020-08-07 DIAGNOSIS — F411 Generalized anxiety disorder: Secondary | ICD-10-CM | POA: Diagnosis not present
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
## 2020-08-10 ENCOUNTER — Encounter (INDEPENDENT_AMBULATORY_CARE_PROVIDER_SITE_OTHER): Payer: BC Managed Care – PPO | Admitting: Ophthalmology

## 2020-08-15 ENCOUNTER — Telehealth: Payer: Self-pay | Admitting: Psychiatry

## 2020-08-15 NOTE — Telephone Encounter (Signed)
Pt called to report Buspar not working. Any ideas. Contact # [phone]. Apt  3/21

## 2020-08-16 MED ORDER — MODAFINIL 200 MG PO TABS: ORAL_TABLET | ORAL | 1 refills | Status: DC

## 2020-08-16 NOTE — Telephone Encounter (Signed)
Returned call to pt. She reports that she is having difficulty with concentration and feels as if she is walking through a "heavy haze." She reports that she does not notice any improvement with Buspar. She reports that she had some mild malaise with Buspar and stopped taking it. She reports that her anxiety has been "pretty manageable." Denies any significant depression. She reports difficulty retaining information. She reports that she has had long-standing distractibility. She reports that energy is ok until 1 pm and then energy is lower. Reports feeling "in a fog."   Discussed potential benefits, risks, and side effects of Modafinil to improve concentration and energy. Pt agrees to trial of Modafinil. Patient advised to contact office with any questions, adverse effects, or acute worsening in signs and symptoms.

## 2020-08-16 NOTE — Addendum Note (Signed)
Addended by: Sharyl Nimrod on: 08/16/2020 03:46 PM   Modules accepted: Orders

## 2020-08-21 ENCOUNTER — Ambulatory Visit (INDEPENDENT_AMBULATORY_CARE_PROVIDER_SITE_OTHER): Payer: BC Managed Care – PPO | Admitting: Psychiatry

## 2020-08-21 ENCOUNTER — Other Ambulatory Visit: Payer: Self-pay

## 2020-08-21 ENCOUNTER — Encounter: Payer: Self-pay | Admitting: Psychiatry

## 2020-08-21 DIAGNOSIS — F411 Generalized anxiety disorder: Secondary | ICD-10-CM

## 2020-08-21 NOTE — Progress Notes (Signed)
      [facility] Counselor/Therapist Progress Note  Patient ID: Rebecca Holt, MRN: [id number],    Date: 08/21/2020  Time Spent: 50 minutes   Treatment Type: Individual Therapy  Reported Symptoms: anxiety  Mental Status Exam:  Appearance:   Casual and Well Groomed     Behavior:  Appropriate  Motor:  Normal  Speech/Language:   Clear and Coherent  Affect:  Appropriate  Mood:  anxious  Thought process:  normal  Thought content:    WNL  Sensory/Perceptual disturbances:    WNL  Orientation:  oriented to person, place, time/date and situation  Attention:  Good  Concentration:  Good  Memory:  WNL  Fund of knowledge:   Good  Insight:    Good  Judgment:   Good  Impulse Control:  Good   Risk Assessment: Danger to Self:  No Self-injurious Behavior: No Danger to Others: No Duty to Warn:no Physical Aggression / Violence:No  Access to Firearms a concern: No  Gang Involvement:No   Subjective: The client states that she finds herself being apologetic for no reason.  "I also feel guilty if I set a boundary."  We discussed the fact that the client came out of a codependent family system.  She had a narcissistic mother who was also bipolar and unpredictable.  As a child she would do what it took to calm her mother down this included apologizing for things that she did not do and being responsible for her mother's emotions.  We discussed that the way to change this would be to be more aware of it and practice mood independent behavior to act against what she feels.  Today we used eye-movement focusing on the anxiety that the client feels around setting boundaries.  Negative cognition is, "I am being mean."  She feels it in her chest.  Her subjective units of distress is a 7.  As the client processed I explained to her that when she sets a boundary with someone to turn it around.  If the other person was setting the boundary with her would she think it was a bad thing?  If it was not then it is  most likely okay.  As we continue to process I discussed with the client the need for her to take care of herself.  I used the analogy of a garden.  If the garden does not have a fence then what ever animal or person is around can wander through the garden and take what they want.  With the fence it protects the garden and the gate allows her to let in who she wants and keep out those she needs to.  This makes perfect sense to the client.  Her subjective units of distress was less than 1.  Her positive cognition was, "I can set boundaries."  Interventions: Assertiveness/Communication, Motivational Interviewing, Solution-Oriented/Positive Psychology, [employer] Group Desensitization and Reprocessing (EMDR) and Insight-Oriented  Diagnosis:   ICD-10-CM   1. Generalized anxiety disorder  F41.1     Plan: Mood independent behavior, positive self talk, self-care, boundaries, assertiveness.  Adithya Difrancesco, [facility]

## 2020-08-28 ENCOUNTER — Encounter (INDEPENDENT_AMBULATORY_CARE_PROVIDER_SITE_OTHER): Payer: BC Managed Care – PPO | Admitting: Ophthalmology

## 2020-09-04 ENCOUNTER — Ambulatory Visit: Payer: BC Managed Care – PPO | Admitting: Psychiatry

## 2020-09-07 ENCOUNTER — Telehealth: Payer: Self-pay

## 2020-09-07 NOTE — Telephone Encounter (Signed)
Patient has had covid since 3/2 and patient is experiencing extreme nausea and would like [place] to call something in just for that . Please advise

## 2020-09-08 ENCOUNTER — Other Ambulatory Visit: Payer: Self-pay

## 2020-09-08 MED ORDER — PROMETHAZINE HCL 25 MG PO TABS: 25.0000 mg | ORAL_TABLET | Freq: Four times a day (QID) | ORAL | 0 refills | Status: DC | PRN

## 2020-09-08 NOTE — Telephone Encounter (Signed)
Please advise 

## 2020-09-08 NOTE — Telephone Encounter (Signed)
Please order phenergan 25mg  po q4-6 hours prn #20. Rec OV if not improving (virtual). Needs OV for cpe scheduled.  Only seen once 03/2020 - over due. Thanks.

## 2020-09-08 NOTE — Telephone Encounter (Signed)
Patient aware meds sent to pharmacy. Aware to scheduled visit if needed. Aware to return call to schedule CPE

## 2020-09-09 ENCOUNTER — Emergency Department (HOSPITAL_COMMUNITY)
Admission: EM | Admit: 2020-09-09 | Discharge: 2020-09-09 | Disposition: A | Discharge status: Home / Self Care | Payer: BC Managed Care – PPO | Attending: Emergency Medicine | Admitting: Emergency Medicine

## 2020-09-09 ENCOUNTER — Encounter (HOSPITAL_COMMUNITY): Payer: Self-pay | Admitting: Emergency Medicine

## 2020-09-09 ENCOUNTER — Other Ambulatory Visit: Payer: Self-pay

## 2020-09-09 ENCOUNTER — Emergency Department (HOSPITAL_COMMUNITY): Payer: BC Managed Care – PPO

## 2020-09-09 DIAGNOSIS — Z91040 Latex allergy status: Secondary | ICD-10-CM | POA: Insufficient documentation

## 2020-09-09 DIAGNOSIS — R059 Cough, unspecified: Secondary | ICD-10-CM | POA: Diagnosis not present

## 2020-09-09 DIAGNOSIS — R002 Palpitations: Secondary | ICD-10-CM | POA: Diagnosis not present

## 2020-09-09 DIAGNOSIS — Z20822 Contact with and (suspected) exposure to covid-19: Secondary | ICD-10-CM

## 2020-09-09 DIAGNOSIS — E039 Hypothyroidism, unspecified: Secondary | ICD-10-CM | POA: Insufficient documentation

## 2020-09-09 DIAGNOSIS — U071 COVID-19: Secondary | ICD-10-CM | POA: Diagnosis not present

## 2020-09-09 DIAGNOSIS — Z8616 Personal history of COVID-19: Secondary | ICD-10-CM

## 2020-09-09 DIAGNOSIS — R5383 Other fatigue: Secondary | ICD-10-CM | POA: Diagnosis not present

## 2020-09-09 HISTORY — DX: Personal history of COVID-19: Z86.16

## 2020-09-09 LAB — BASIC METABOLIC PANEL
Anion gap: 10 (ref 5–15)
BUN: 11 mg/dL (ref 6–20)
CO2: 24 mmol/L (ref 22–32)
Calcium: 9.1 mg/dL (ref 8.9–10.3)
Chloride: 103 mmol/L (ref 98–111)
Creatinine, Ser: 0.8 mg/dL (ref 0.44–1.00)
GFR, Estimated: >60 mL/min (ref >60)
Glucose, Bld: 106 mg/dL — ABNORMAL HIGH (ref 70–99)
Potassium: 3.8 mmol/L (ref 3.5–5.1)
Sodium: 137 mmol/L (ref 135–145)

## 2020-09-09 LAB — CBC
HCT: 39.6 % (ref 36.0–46.0)
Hemoglobin: 12.6 g/dL (ref 12.0–15.0)
MCH: 26.4 pg (ref 26.0–34.0)
MCHC: 31.8 g/dL (ref 30.0–36.0)
MCV: 83 fL (ref 80.0–100.0)
Platelets: 320 10*3/uL (ref 150–400)
RBC: 4.77 MIL/uL (ref 3.87–5.11)
RDW: 13.8 % (ref 11.5–15.5)
WBC: 7.3 10*3/uL (ref 4.0–10.5)
nRBC: 0 % (ref 0.0–0.2)

## 2020-09-09 LAB — I-STAT BETA HCG BLOOD, ED (MC, WL, AP ONLY): I-stat hCG, quantitative: <5 m[IU]/mL (ref <5)

## 2020-09-09 NOTE — Discharge Instructions (Addendum)
Drink plenty fluids.  Rest.  Follow-up with your doctor for further evaluation if the symptoms persist.

## 2020-09-09 NOTE — ED Triage Notes (Signed)
Patient c/o weakness and palpitations since Covid symptoms started last week. States family tested positive. Patient concerned with persistent weakness.

## 2020-09-10 LAB — SARS CORONAVIRUS 2 (TAT 6-24 HRS): SARS Coronavirus 2: POSITIVE — AB

## 2020-09-10 NOTE — ED Provider Notes (Signed)
[facility] DEPT Provider Note   CSN: [id number] Arrival date & time: 09/09/20  1318     History Chief Complaint  Patient presents with  . Weakness  . Covid Exposure    Rebecca Holt is a 35 y.o. female.  HPI    Patient presented to the emergency room for evaluation of palpitations and fatigue since last week.  Patient states she is feeling generally weaker than normal.  Her family was recently diagnosed with covid.  Patient has been vaccinated with the J&J vaccine but she has not been tested herself.  She states she is having some cough and congestion but her symptoms are rather mild.  She is not having any shortness of breath.  Patient states she is not having chest pain and does not feel short of breath but just kind of feels an uneasiness in her chest.  Patient was concerned so she came to the ED for for evaluation  Past Medical History:  Diagnosis Date  . Anxiety   . Bipolar disorder ([place])   . Chicken pox   . Depressive disorder 02/08/2019  . Hypothyroidism   . Pneumonia     Patient Active Problem List   Diagnosis Date Noted  . Panic attacks 03/20/2020  . 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 Lumbar foraminal stenosis 05/07/2019  . GAD (generalized anxiety disorder) 03/10/2019  . OCD (obsessive compulsive disorder) 03/10/2019  . Major depression, recurrent, chronic ([place]) 02/08/2019  . Chronic tonsillitis 11/16/2018  . Acquired hypothyroidism 08/02/2010    Past Surgical History:  Procedure Laterality Date  . TONSILLECTOMY Bilateral   . TRANSFORAMINAL LUMBAR INTERBODY FUSION (TLIF) WITH PEDICLE SCREW FIXATION 1 LEVEL N/A 05/07/2019   Procedure: Lumbar Five- Sacral One Transforaminal lumbar interbody Fusion;  Surgeon: Erline Levine, MD;  Location: [place];  Service: Neurosurgery;  Laterality: N/A;  Lumbar 5 Sacral 1 Transforaminal lumbar interbody fusion     OB History    Gravida  4    Para  3   Term  2   Preterm  1   AB  1   Living  3     SAB  1   IAB  0   Ectopic  0   Multiple  0   Live Births  3           Family History  Problem Relation Age of Onset  . Diabetic kidney disease Father   . Hyperlipidemia Father   . Diabetes Father   . Bipolar disorder Mother   . Mental illness Mother        bipolar  . Cancer Paternal Grandfather        colon  . Diabetes Paternal Grandfather   . Thyroid cancer Maternal Grandmother   . Cancer Maternal Grandfather        leukemia  . Bipolar disorder Son   . Anesthesia problems Neg Hx   . Hypotension Neg Hx   . Malignant hyperthermia Neg Hx   . Pseudochol deficiency Neg Hx     Social History   Tobacco Use  . Smoking status: Never Smoker  . Smokeless tobacco: Never Used  Vaping Use  . Vaping Use: Never used  Substance Use Topics  . Alcohol use: No  . Drug use: No    Home Medications Prior to Admission medications   Medication Sig Start Date End Date Taking? Authorizing Provider  ALPRAZolam Duanne Moron) 0.5 MG tablet TAKE ONE TABLET  BY MOUTH TWO TIMES A DAY AS NEEDED FOR ANXIETY 07/20/20   Thayer Headings, PMHNP  amoxicillin (AMOXIL) 875 MG tablet Take 1 tablet (875 mg total) by mouth 2 (two) times daily. 07/31/20   Inda Coke, PA  buPROPion (WELLBUTRIN XL) 150 MG 24 hr tablet Take 1 tablet (150 mg total) by mouth daily. 07/31/20   Thayer Headings, PMHNP  cholecalciferol (VITAMIN D3) 25 MCG (1000 UNIT) tablet Take 1,000 Units by mouth daily.    [provider]  Ferrous Sulfate (IRON PO) Take by mouth.    [provider]  fluticasone (FLONASE) 50 MCG/ACT nasal spray Place 2 sprays into both nostrils daily. 07/31/20   Inda Coke, PA  lamoTRIgine (LAMICTAL) 25 MG tablet Take 3 tablets (75 mg total) by mouth daily. 07/31/20 08/30/20  Thayer Headings, PMHNP  modafinil (PROVIGIL) 200 MG tablet Take 1/2-1 tablet po qd 08/16/20   Thayer Headings, PMHNP  promethazine (PHENERGAN) 25 MG tablet  Take 1 tablet (25 mg total) by mouth every 6 (six) hours as needed for nausea or vomiting. 09/08/20   Leamon Arnt, MD  Pyridoxine HCl (VITAMIN B6 PO) Take by mouth.    [provider]    Allergies    Tramadol and Latex  Review of Systems   Review of Systems  All other systems reviewed and are negative.   Physical Exam Updated Vital Signs BP (!) 127/92 (BP Location: Left Arm)   Pulse 78   Temp 99.4 F (37.4 C) (Oral)   Resp 18   Ht 1.702 m (5\' 7" )   Wt 81.6 kg   LMP 08/10/2020 (Approximate)   SpO2 100%   BMI 28.19 kg/m   Physical Exam Vitals and nursing note reviewed.  Constitutional:      General: She is not in acute distress.    Appearance: She is well-developed.  HENT:     Head: Normocephalic and atraumatic.     Right Ear: External ear normal.     Left Ear: External ear normal.  Eyes:     General: No scleral icterus.       Right eye: No discharge.        Left eye: No discharge.     Conjunctiva/sclera: Conjunctivae normal.  Neck:     Trachea: No tracheal deviation.  Cardiovascular:     Rate and Rhythm: Normal rate and regular rhythm.  Pulmonary:     Effort: Pulmonary effort is normal. No respiratory distress.     Breath sounds: Normal breath sounds. No stridor. No wheezing or rales.  Abdominal:     General: Bowel sounds are normal. There is no distension.     Palpations: Abdomen is soft.     Tenderness: There is no abdominal tenderness. There is no guarding or rebound.  Musculoskeletal:        General: No tenderness.     Cervical back: Neck supple.  Skin:    General: Skin is warm and dry.     Findings: No rash.  Neurological:     Mental Status: She is alert.     Cranial Nerves: No cranial nerve deficit (no facial droop, extraocular movements intact, no slurred speech).     Sensory: No sensory deficit.     Motor: No abnormal muscle tone or seizure activity.     Coordination: Coordination normal.     ED Results / Procedures / Treatments    Labs (all labs ordered are listed, but only abnormal results are displayed) Labs Reviewed  SARS CORONAVIRUS  2 (TAT 6-24 HRS) - Abnormal; Notable for the following components:      Result Value   SARS Coronavirus 2 POSITIVE (*)    All other components within normal limits  BASIC METABOLIC PANEL - Abnormal; Notable for the following components:   Glucose, Bld 106 (*)    All other components within normal limits  CBC  I-STAT BETA HCG BLOOD, ED ([facility], [facility], [facility] ONLY)    EKG EKG Interpretation  Date/Time:  Saturday September 09 2020 13:55:57 EST Ventricular Rate:  93 PR Interval:    QRS Duration: 101 QT Interval:  365 QTC Calculation: 454 R Axis:   92 Text Interpretation: Sinus rhythm Borderline right axis deviation Low voltage, precordial leads Nonspecific T abnormalities, lateral leads Baseline wander in lead(s) I II aVR aVL No old tracing to compare Confirmed by Dorie Rank [phone]) on 09/09/2020 2:29:58 PM   Radiology DG Chest Portable 1 View  Result Date: 09/09/2020 CLINICAL DATA:  Cough and palpitations.  COVID-19. EXAM: PORTABLE CHEST 1 VIEW COMPARISON:  None. FINDINGS: The heart size and mediastinal contours are within normal limits. Both lungs are clear. The visualized skeletal structures are unremarkable. IMPRESSION: No active disease. Electronically Signed   By: Dorise Bullion III M.D   On: 09/09/2020 15:05    Procedures Procedures   Medications Ordered in ED Medications - No data to display  ED Course  I have reviewed the triage vital signs and the nursing notes.  Pertinent labs & imaging results that were available during my care of the patient were reviewed by me and considered in my medical decision making (see chart for details).    MDM Rules/Calculators/A&P                          Patient presented with complaints of general malaise associated with probable covid diagnosis.  In the ED she did not have any tachydysrhythmias.  Her EKG is reassuring.  No evidence of  pneumonia on x-ray.  She is not short of breath or hypoxic.  I have low suspicion for PE despite her probable covid diagnosis.  Discussed doing a confirmatory covid test for her in case her symptoms persist and it turns out she does not have covid.  Patient agrees.  Follow-up testing after the patient left revealed that her Covid test is positive.  I suspect her symptoms are related to that.  Fortunately she is not showing any signs of severe complications and stable for continued outpatient management. Final Clinical Impression(s) / ED Diagnoses Final diagnoses:  Person under investigation for COVID-19    Rx / DC Orders ED Discharge Orders    None       Dorie Rank, MD 09/10/20 [phone]

## 2020-09-11 ENCOUNTER — Telehealth: Payer: Self-pay

## 2020-09-11 ENCOUNTER — Encounter (INDEPENDENT_AMBULATORY_CARE_PROVIDER_SITE_OTHER): Payer: BC Managed Care – PPO | Admitting: Ophthalmology

## 2020-09-11 NOTE — Telephone Encounter (Signed)
Called to discuss with patient about COVID-19 symptoms and the use of one of the available treatments for those with mild to moderate Covid symptoms and at a high risk of hospitalization.  Pt appears to qualify for outpatient treatment due to co-morbid conditions and/or a member of an at-risk group in accordance with the FDA Emergency Use Authorization.    Symptom onset: 2 weeks ago Weakness Vaccinated: Youth worker? No Immunocompromised? No Qualifiers: None  Pt. Is out of treatment window.   Rebecca Holt

## 2020-09-18 ENCOUNTER — Other Ambulatory Visit: Payer: Self-pay

## 2020-09-18 ENCOUNTER — Telehealth (INDEPENDENT_AMBULATORY_CARE_PROVIDER_SITE_OTHER): Payer: BC Managed Care – PPO | Admitting: Psychiatry

## 2020-09-18 ENCOUNTER — Encounter: Payer: Self-pay | Admitting: Psychiatry

## 2020-09-18 ENCOUNTER — Ambulatory Visit (INDEPENDENT_AMBULATORY_CARE_PROVIDER_SITE_OTHER): Payer: BC Managed Care – PPO | Admitting: Psychiatry

## 2020-09-18 DIAGNOSIS — F410 Panic disorder [episodic paroxysmal anxiety] without agoraphobia: Secondary | ICD-10-CM

## 2020-09-18 DIAGNOSIS — F332 Major depressive disorder, recurrent severe without psychotic features: Secondary | ICD-10-CM | POA: Diagnosis not present

## 2020-09-18 DIAGNOSIS — F39 Unspecified mood [affective] disorder: Secondary | ICD-10-CM

## 2020-09-18 DIAGNOSIS — G4700 Insomnia, unspecified: Secondary | ICD-10-CM

## 2020-09-18 DIAGNOSIS — F411 Generalized anxiety disorder: Secondary | ICD-10-CM | POA: Diagnosis not present

## 2020-09-18 MED ORDER — ALPRAZOLAM 0.5 MG PO TABS: ORAL_TABLET | ORAL | 3 refills | Status: DC

## 2020-09-18 MED ORDER — MODAFINIL 200 MG PO TABS: ORAL_TABLET | ORAL | 2 refills | Status: DC

## 2020-09-18 MED ORDER — BUPROPION HCL ER (XL) 150 MG PO TB24: 150.0000 mg | ORAL_TABLET | Freq: Every day | ORAL | 2 refills | Status: DC

## 2020-09-18 MED ORDER — LAMOTRIGINE 25 MG PO TABS: 75.0000 mg | ORAL_TABLET | Freq: Every day | ORAL | 2 refills | Status: DC

## 2020-09-18 NOTE — Progress Notes (Signed)
      [facility] Counselor/Therapist Progress Note  Patient ID: Rebecca Holt, MRN: [id number],    Date: 09/18/2020  Time Spent: 45 minutes   Treatment Type: Individual Therapy  Reported Symptoms: anxiety, rumination  Mental Status Exam:  Appearance:   Casual     Behavior:  Appropriate  Motor:  Normal  Speech/Language:   Clear and Coherent  Affect:  Appropriate  Mood:  anxious  Thought process:  normal  Thought content:    Rumination  Sensory/Perceptual disturbances:    WNL  Orientation:  oriented to person, place, time/date and situation  Attention:  Good  Concentration:  Good  Memory:  WNL  Fund of knowledge:   Good  Insight:    Good  Judgment:   Good  Impulse Control:  Good   Risk Assessment: Danger to Self:  No Self-injurious Behavior: No Danger to Others: No Duty to Warn:no Physical Aggression / Violence:No  Access to Firearms a concern: No  Gang Involvement:No   Subjective: Client states that her family is just recovered from having COVID-19.  She feels like she is just getting back on her feet.  She states that the modafinil is working well for her during the day.  Today the client states that she is worried because her husband has started a new job.  It is closer to home and thus less of a commute which is positive.  The client states her main worry is that because he is going to a new place he will meet a new woman and possibly have another affair.  Today we used eye-movement focusing on this issue for the client.  Negative cognition is, "he will meet someone new."  She feels uneasy and scared in her chest.  Her subjective units of distress is a 5+.  As the client processed, I asked her what is the difference with her husband now versus 3 years ago when his affair happened?  She states he had just been diagnosed with bipolar disorder.  He was started on medicine which completely changed how he acted.  She states that he and the client started attending church together  which has made a big difference in their relationship.  "Overall her whole family is better".  As the client continue to process she stated that, "transformation has taken place.  I do not need to worry."  Her subjective units of distress was a 2.  Interventions: Assertiveness/Communication, Motivational Interviewing, Solution-Oriented/Positive Psychology, [employer] Desensitization and Reprocessing (EMDR) and Insight-Oriented  Diagnosis:   ICD-10-CM   1. Generalized anxiety disorder  F41.1     Plan: Radical acceptance, mood independent behavior, positive self talk, mindful prayer, self-care, assertiveness, boundaries.  Cloys Vera, [facility]

## 2020-09-18 NOTE — Progress Notes (Signed)
Rebecca Holt [id number] 11-22-1985 35 y.o.  Virtual Visit via Telephone Note  I connected with pt on 09/18/20 at  9:00 AM EDT by telephone and verified that I am speaking with the correct person using two identifiers.   I discussed the limitations, risks, security and privacy concerns of performing an evaluation and management service by telephone and the availability of in person appointments. I also discussed with the patient that there may be a patient responsible charge related to this service. The patient expressed understanding and agreed to proceed.   I discussed the assessment and treatment plan with the patient. The patient was provided an opportunity to ask questions and all were answered. The patient agreed with the plan and demonstrated an understanding of the instructions.   The patient was advised to call back or seek an in-person evaluation if the symptoms worsen or if the condition fails to improve as anticipated.  I provided 20 minutes of non-face-to-face time during this encounter.  The patient was located at home.  The provider was located at [place].   Thayer Headings, PMHNP   Subjective:   Patient ID:  Rebecca Holt is a 35 y.o. (DOB 12-May-1986) female.  Chief Complaint:  Chief Complaint  Patient presents with  . Follow-up    H/o anxiety, insomnia, and mood disturbance    HPI Rebecca Holt presents for follow-up of mood disturbance, anxiety, and insomnia. She reports, "I've been doing good, actually." She reports, "I feel the best I have in awhile."   She reports that Modafinil has been helpful for her energy, motivation, and concentration which in turn has helped with her mood. She reports that she no "longer feels like I am in a haze." Denies any worsening anxiety or irritability. She reports that she takes Alprazolam most nights to help with sleep initiation. She sleeps at least 6 hours a night. Reports that she has been feeling better and more rested  the next day. Describes mood as "pretty stable." Denies increased irritability. Denies depressed mood. She reports that anxiety has been manageable. Denies any recent panic attacks. Appetite has been ok. Denies impulsive or risky behavior. She reports that memory is not good at baseline. Denies SI.   She reports that she and her entire family had COVID last month. She reports that her energy has been improving over the last few weeks.   Husband started a new job and their insurance will be changing.   Past Psychiatric Medication Trials: Ativan- partially effective Tranxene Wellbutrin XL- Increased activation/agitation with 450 mg. Reports that Wellbutrin seemed to initially be effective. Zoloft Lexapro Celexa- Had severe discontinuation Pristiq Abilify Rexulti- Cost prohibitive Olanzapine Risperidone- Involuntary orofacial movements Trileptal Lamictal- Had increased irritability and insomnia with 100 mg dose. Ambien  Review of Systems:  Review of Systems  Respiratory: Negative for shortness of breath.   Cardiovascular: Negative for palpitations.  Gastrointestinal: Negative.   Musculoskeletal: Negative for gait problem.  Neurological: Negative for tremors and headaches.  Psychiatric/Behavioral:       Please refer to HPI    Medications: I have reviewed the patient's current medications.  Current Outpatient Medications  Medication Sig Dispense Refill  . cholecalciferol (VITAMIN D3) 25 MCG (1000 UNIT) tablet Take 1,000 Units by mouth daily.    . Ferrous Sulfate (IRON PO) Take by mouth.    . Pyridoxine HCl (VITAMIN B6 PO) Take by mouth.    Derrill Memo ON 09/25/2020] ALPRAZolam (XANAX) 0.5 MG tablet TAKE ONE TABLET BY MOUTH TWO TIMES  A DAY AS NEEDED FOR ANXIETY 45 tablet 3  . amoxicillin (AMOXIL) 875 MG tablet Take 1 tablet (875 mg total) by mouth 2 (two) times daily. (Patient not taking: Reported on 09/18/2020) 20 tablet 0  . buPROPion (WELLBUTRIN XL) 150 MG 24 hr tablet  Take 1 tablet (150 mg total) by mouth daily. 30 tablet 2  . fluticasone (FLONASE) 50 MCG/ACT nasal spray Place 2 sprays into both nostrils daily. (Patient not taking: Reported on 09/18/2020) 16 g 2  . lamoTRIgine (LAMICTAL) 25 MG tablet Take 3 tablets (75 mg total) by mouth daily. 90 tablet 2  . [START ON 10/12/2020] modafinil (PROVIGIL) 200 MG tablet Take 1/2-1 tablet po qd 30 tablet 2  . promethazine (PHENERGAN) 25 MG tablet Take 1 tablet (25 mg total) by mouth every 6 (six) hours as needed for nausea or vomiting. (Patient not taking: Reported on 09/18/2020) 20 tablet 0   No current facility-administered medications for this visit.    Medication Side Effects: None  Allergies:  Allergies  Allergen Reactions  . Tramadol Itching  . Latex Other (See Comments)    Causes irritation.    Past Medical History:  Diagnosis Date  . Anxiety   . Bipolar disorder ([place])   . Chicken pox   . Depressive disorder 02/08/2019  . Hypothyroidism   . Pneumonia     Family History  Problem Relation Age of Onset  . Diabetic kidney disease Father   . Hyperlipidemia Father   . Diabetes Father   . Bipolar disorder Mother   . Mental illness Mother        bipolar  . Cancer Paternal Grandfather        colon  . Diabetes Paternal Grandfather   . Thyroid cancer Maternal Grandmother   . Cancer Maternal Grandfather        leukemia  . Bipolar disorder Son   . Anesthesia problems Neg Hx   . Hypotension Neg Hx   . Malignant hyperthermia Neg Hx   . Pseudochol deficiency Neg Hx     Social History   Socioeconomic History  . Marital status: Married    Spouse name: Not on file  . Number of children: Not on file  . Years of education: Not on file  . Highest education level: Not on file  Occupational History  . Not on file  Tobacco Use  . Smoking status: Never Smoker  . Smokeless tobacco: Never Used  Vaping Use  . Vaping Use: Never used  Substance and Sexual Activity  . Alcohol use: No  . Drug use:  No  . Sexual activity: Yes    Birth control/protection: None    Comment: tubal after   Other Topics Concern  . Not on file  Social History Narrative  . Not on file   Social Determinants of Health   Financial Resource Strain: Not on file  Food Insecurity: Not on file  Transportation Needs: Not on file  Physical Activity: Not on file  Stress: Not on file  Social Connections: Not on file  Intimate Partner Violence: Not on file    Past Medical History, Surgical history, Social history, and Family history were reviewed and updated as appropriate.   Please see review of systems for further details on the patient's review from today.   Objective:   Physical Exam:  There were no vitals taken for this visit.  Physical Exam Neurological:     Mental Status: She is alert and oriented to person, place, and time.  Cranial Nerves: No dysarthria.  Psychiatric:        Attention and Perception: Attention and perception normal.        Mood and Affect: Mood normal.        Speech: Speech normal.        Behavior: Behavior is cooperative.        Thought Content: Thought content normal. Thought content is not paranoid or delusional. Thought content does not include homicidal or suicidal ideation. Thought content does not include homicidal or suicidal plan.        Cognition and Memory: Cognition and memory normal.        Judgment: Judgment normal.     Comments: Insight intact     Lab Review:     Component Value Date/Time   NA 137 09/09/2020 1344   K 3.8 09/09/2020 1344   CL 103 09/09/2020 1344   CO2 24 09/09/2020 1344   GLUCOSE 106 (H) 09/09/2020 1344   BUN 11 09/09/2020 1344   CREATININE 0.80 09/09/2020 1344   CALCIUM 9.1 09/09/2020 1344   PROT 7.0 03/16/2019 0926   ALBUMIN 3.9 03/16/2019 0926   AST 21 03/16/2019 0926   ALT 22 03/16/2019 0926   ALKPHOS 55 03/16/2019 0926   BILITOT 0.6 03/16/2019 0926   GFRNONAA >60 09/09/2020 1344   GFRAA >60 03/16/2019 0926        Component Value Date/Time   WBC 7.3 09/09/2020 1344   RBC 4.77 09/09/2020 1344   HGB 12.6 09/09/2020 1344   HCT 39.6 09/09/2020 1344   PLT 320 09/09/2020 1344   MCV 83.0 09/09/2020 1344   MCH 26.4 09/09/2020 1344   MCHC 31.8 09/09/2020 1344   RDW 13.8 09/09/2020 1344   LYMPHSABS 1.6 03/16/2019 0926   MONOABS 0.4 03/16/2019 0926   EOSABS 0.1 03/16/2019 0926   BASOSABS 0.0 03/16/2019 0926    No results found for: POCLITH, LITHIUM   No results found for: PHENYTOIN, PHENOBARB, VALPROATE, CBMZ   .res Assessment: Plan:   Will continue current plan of care since target signs and symptoms are well controlled without any tolerability issues. Continue Lamictal 75 mg po qd for mood s/s.  Continue Wellbutrin XL 150 mg po qd for depression.  Continue Modafinil 200 mg po q am for off- label indications of low-energy, low motivation, and impaired concentration.  Continue Xanax 0.5 mg po BID prn anxiety and insomnia.  Recommend continuing therapy with Georgana Curio, [facility]. Pt to follow-up in 3 months or sooner if clinically indicated.  Patient advised to contact office with any questions, adverse effects, or acute worsening in signs and symptoms.   Rebecca Holt was seen today for follow-up.  Diagnoses and all orders for this visit:  Insomnia, unspecified type -     ALPRAZolam (XANAX) 0.5 MG tablet; TAKE ONE TABLET BY MOUTH TWO TIMES A DAY AS NEEDED FOR ANXIETY  Panic attacks -     ALPRAZolam (XANAX) 0.5 MG tablet; TAKE ONE TABLET BY MOUTH TWO TIMES A DAY AS NEEDED FOR ANXIETY  Severe episode of recurrent major depressive disorder, without psychotic features (HCC) -     buPROPion (WELLBUTRIN XL) 150 MG 24 hr tablet; Take 1 tablet (150 mg total) by mouth daily.  Episodic mood disorder (HCC) -     lamoTRIgine (LAMICTAL) 25 MG tablet; Take 3 tablets (75 mg total) by mouth daily.  Other orders -     modafinil (PROVIGIL) 200 MG tablet; Take 1/2-1 tablet po qd    Please see After  Visit Summary  for patient specific instructions.  Future Appointments  Date Time Provider [place]  09/18/2020 12:00 PM May, [place], [facility] CP-CP None  10/16/2020  9:00 AM May, [place], [facility] CP-CP None  10/30/2020 10:00 AM May, [place], [facility] CP-CP None  11/13/2020 10:00 AM May, [place], [facility] CP-CP None  11/28/2020  9:00 AM May, [place], [facility] CP-CP None    No orders of the defined types were placed in this encounter.     -------------------------------

## 2020-09-25 DIAGNOSIS — M533 Sacrococcygeal disorders, not elsewhere classified: Secondary | ICD-10-CM | POA: Diagnosis not present

## 2020-09-25 DIAGNOSIS — M461 Sacroiliitis, not elsewhere classified: Secondary | ICD-10-CM | POA: Diagnosis not present

## 2020-10-16 ENCOUNTER — Other Ambulatory Visit: Payer: Self-pay

## 2020-10-16 ENCOUNTER — Encounter: Payer: Self-pay | Admitting: Psychiatry

## 2020-10-16 ENCOUNTER — Ambulatory Visit (INDEPENDENT_AMBULATORY_CARE_PROVIDER_SITE_OTHER): Payer: 59 | Admitting: Psychiatry

## 2020-10-16 ENCOUNTER — Telehealth: Payer: Self-pay | Admitting: Psychiatry

## 2020-10-16 DIAGNOSIS — F411 Generalized anxiety disorder: Secondary | ICD-10-CM

## 2020-10-16 NOTE — Progress Notes (Signed)
      [facility] Counselor/Therapist Progress Note  Patient ID: Gabriela Irigoyen, MRN: [id number],    Date: 10/16/2020  Time Spent: 45 minutes   Treatment Type: Individual Therapy  Reported Symptoms: anxious  Mental Status Exam:  Appearance:   Casual     Behavior:  Appropriate  Motor:  Normal  Speech/Language:   Clear and Coherent  Affect:  Appropriate  Mood:  anxious  Thought process:  normal  Thought content:    WNL  Sensory/Perceptual disturbances:    WNL  Orientation:  oriented to person, place, time/date and situation  Attention:  Good  Concentration:  Good  Memory:  WNL  Fund of knowledge:   Good  Insight:    Good  Judgment:   Good  Impulse Control:  Good   Risk Assessment: Danger to Self:  No Self-injurious Behavior: No Danger to Others: No Duty to Warn:no Physical Aggression / Violence:No  Access to Firearms a concern: No  Gang Involvement:No   Subjective: Client states that her oldest son is flunking out of school.  As a result, the client sold his phone and PlayStation.  The client had discovered when she looked at his social media accounts that he was presenting himself to others as a girl with a fatal illness.  He then was using that to manipulate people into giving him gift cards.  When the client's husband came home that day he laid it out for the stepson that his behavior could land him in jail.  This had a huge impact on the stepson.  The client has also followed up with the [facility] program for high school students.  She sees that it could be a good opportunity for him to learn some discipline.  The client has found that her son can sound like his biological dad.  "It shuts me down ".  Today we focused on this using eye-movement.  She felt anxiety in her chest.  Her subjective units of distress was a 7.  As the client processed she was able to see that she had some positive things in place that were going to help her son.  Overall she sees  that he is a reasonable young man.  Her subjective units of distress was less than 1 at the end of the session. The client states that in light of my retirement at the end of April she is going to take a break from therapy.  She feels like she has accomplished what she intended to.  If she needs to return she will.  Services ended by mutual consent.  Interventions: Assertiveness/Communication, Motivational Interviewing, Solution-Oriented/Positive Psychology, [facility] Desensitization and Reprocessing (EMDR) and Insight-Oriented  Diagnosis:   ICD-10-CM   1. GAD (generalized anxiety disorder)  F41.1     Plan: Mood independent behavior, boundaries, assertiveness, self-care, positive self talk.  Samhita Kretsch, [facility]

## 2020-10-16 NOTE — Telephone Encounter (Signed)
Please review

## 2020-10-16 NOTE — Telephone Encounter (Signed)
Rebecca Holt called in today stating that she went to the pharmacy yesterday to pick up her prescription for Modafinil 200mg  and was told she needed a PA. Pls call [phone]

## 2020-10-17 NOTE — Telephone Encounter (Signed)
P.A. Request given to Traci.

## 2020-10-18 NOTE — Telephone Encounter (Signed)
Pt to use [facility], she's aware and it was filled 04/18

## 2020-10-30 ENCOUNTER — Ambulatory Visit: Payer: BC Managed Care – PPO | Admitting: Psychiatry

## 2020-11-13 ENCOUNTER — Ambulatory Visit: Payer: BC Managed Care – PPO | Admitting: Psychiatry

## 2020-11-28 ENCOUNTER — Ambulatory Visit: Payer: BC Managed Care – PPO | Admitting: Psychiatry

## 2020-12-20 ENCOUNTER — Telehealth (INDEPENDENT_AMBULATORY_CARE_PROVIDER_SITE_OTHER): Payer: 59 | Admitting: Family Medicine

## 2020-12-20 ENCOUNTER — Other Ambulatory Visit: Payer: Self-pay

## 2020-12-20 DIAGNOSIS — R519 Headache, unspecified: Secondary | ICD-10-CM | POA: Diagnosis not present

## 2020-12-20 MED ORDER — ELETRIPTAN HYDROBROMIDE 40 MG PO TABS: 40.0000 mg | ORAL_TABLET | ORAL | 0 refills | Status: DC | PRN

## 2020-12-20 NOTE — Progress Notes (Signed)
Patient ID: Rebecca Holt, female   DOB: 01-01-1986, 35 y.o.   MRN: [id number]   This visit type was conducted due to national recommendations for restrictions regarding the COVID-19 pandemic in an effort to limit this patient's exposure and mitigate transmission in our community.   Virtual Visit via Video Note  I connected with Rebecca Holt on 12/20/20 at  1:45 PM EDT by a video enabled telemedicine application and verified that I am speaking with the correct person using two identifiers.  Location patient: home Location provider:work or home office Persons participating in the virtual visit: patient, provider  I discussed the limitations of evaluation and management by telemedicine and the availability of in person appointments. The patient expressed understanding and agreed to proceed.   HPI:  Daylen relates headache which started last night.  She has had very similar headaches in the past which tend to occur around the time of her period.  Her menses are somewhat irregular.  She is followed by GYN.  She has left-sided headache mostly retro-orbital with some nausea.  She has light sensitivity.  She takes promethazine for nausea but this does cause sedation.  She took Zofran previously but did not seem to work.  She has tried Tylenol and Aleve for recurrent headache which have not helped.  Had COVID several months ago and has had some low-grade nausea ever since her COVID infection.  She does not use birth control but husband has had vasectomy.  She has not taken triptans previously.   ROS: See pertinent positives and negatives per HPI.  Past Medical History:  Diagnosis Date   Anxiety    Bipolar disorder ([place])    Chicken pox    Depressive disorder 02/08/2019   Hypothyroidism    Pneumonia     Past Surgical History:  Procedure Laterality Date   TONSILLECTOMY Bilateral    TRANSFORAMINAL LUMBAR INTERBODY FUSION (TLIF) WITH PEDICLE SCREW FIXATION 1 LEVEL N/A 05/07/2019   Procedure: Lumbar  Five- Sacral One Transforaminal lumbar interbody Fusion;  Surgeon: Erline Levine, MD;  Location: [place];  Service: Neurosurgery;  Laterality: N/A;  Lumbar 5 Sacral 1 Transforaminal lumbar interbody fusion    Family History  Problem Relation Age of Onset   Diabetic kidney disease Father    Hyperlipidemia Father    Diabetes Father    Bipolar disorder Mother    Mental illness Mother        bipolar   Cancer Paternal Grandfather        colon   Diabetes Paternal Grandfather    Thyroid cancer Maternal Grandmother    Cancer Maternal Grandfather        leukemia   Bipolar disorder Son    Anesthesia problems Neg Hx    Hypotension Neg Hx    Malignant hyperthermia Neg Hx    Pseudochol deficiency Neg Hx     SOCIAL HX: non-smoker   Current Outpatient Medications:    ALPRAZolam (XANAX) 0.5 MG tablet, TAKE ONE TABLET BY MOUTH TWO TIMES A DAY AS NEEDED FOR ANXIETY, Disp: 45 tablet, Rfl: 3   amoxicillin (AMOXIL) 875 MG tablet, Take 1 tablet (875 mg total) by mouth 2 (two) times daily. (Patient not taking: Reported on 09/18/2020), Disp: 20 tablet, Rfl: 0   buPROPion (WELLBUTRIN XL) 150 MG 24 hr tablet, Take 1 tablet (150 mg total) by mouth daily., Disp: 30 tablet, Rfl: 2   cholecalciferol (VITAMIN D3) 25 MCG (1000 UNIT) tablet, Take 1,000 Units by mouth daily., Disp: , Rfl:  Ferrous Sulfate (IRON PO), Take by mouth., Disp: , Rfl:    fluticasone (FLONASE) 50 MCG/ACT nasal spray, Place 2 sprays into both nostrils daily. (Patient not taking: Reported on 09/18/2020), Disp: 16 g, Rfl: 2   lamoTRIgine (LAMICTAL) 25 MG tablet, Take 3 tablets (75 mg total) by mouth daily., Disp: 90 tablet, Rfl: 2   modafinil (PROVIGIL) 200 MG tablet, Take 1/2-1 tablet po qd, Disp: 30 tablet, Rfl: 2   promethazine (PHENERGAN) 25 MG tablet, Take 1 tablet (25 mg total) by mouth every 6 (six) hours as needed for nausea or vomiting. (Patient not taking: Reported on 09/18/2020), Disp: 20 tablet, Rfl: 0   Pyridoxine HCl (VITAMIN B6  PO), Take by mouth., Disp: , Rfl:   EXAM:  VITALS per patient if applicable:  GENERAL: alert, oriented, appears well and in no acute distress  HEENT: atraumatic, conjunttiva clear, no obvious abnormalities on inspection of external nose and ears  NECK: normal movements of the head and neck  LUNGS: on inspection no signs of respiratory distress, breathing rate appears normal, no obvious gross SOB, gasping or wheezing  CV: no obvious cyanosis  MS: moves all visible extremities without noticeable abnormality  PSYCH/NEURO: pleasant and cooperative, no obvious depression or anxiety, speech and thought processing grossly intact  ASSESSMENT AND PLAN:  Discussed the following assessment and plan:   Unilateral headache.  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  She has pattern of this occurring around her periods frequently.  -We did discuss possible future use of medication prophylactically such as Frova but she states her periods are somewhat difficult to predict -We have recommended trial of Relpax 40 mg at onset of migraine and repeat 1 in 2 hours if needed but no more than 2 in 24 hours -If headache persists or worsens follow-up promptly with primary for further evaluation    I discussed the assessment and treatment plan with the patient. The patient was provided an opportunity to ask questions and all were answered. The patient agreed with the plan and demonstrated an understanding of the instructions.   The patient was advised to call back or seek an in-person evaluation if the symptoms worsen or if the condition fails to improve as anticipated.     Carolann Littler, MD

## 2020-12-29 ENCOUNTER — Other Ambulatory Visit: Payer: Self-pay

## 2020-12-29 ENCOUNTER — Ambulatory Visit: Payer: 59 | Admitting: Physician Assistant

## 2020-12-29 ENCOUNTER — Encounter: Payer: Self-pay | Admitting: Physician Assistant

## 2020-12-29 VITALS — BP 130/87 | HR 85 | Temp 98.1°F | Ht 67.0 in | Wt 178.2 lb

## 2020-12-29 DIAGNOSIS — R110 Nausea: Secondary | ICD-10-CM | POA: Diagnosis not present

## 2020-12-29 DIAGNOSIS — Z833 Family history of diabetes mellitus: Secondary | ICD-10-CM | POA: Diagnosis not present

## 2020-12-29 DIAGNOSIS — R1033 Periumbilical pain: Secondary | ICD-10-CM

## 2020-12-29 LAB — COMPREHENSIVE METABOLIC PANEL
ALT: 18 U/L (ref 0–35)
AST: 20 U/L (ref 0–37)
Albumin: 4.6 g/dL (ref 3.5–5.2)
Alkaline Phosphatase: 49 U/L (ref 39–117)
BUN: 9 mg/dL (ref 6–23)
CO2: 27 mEq/L (ref 19–32)
Calcium: 9.5 mg/dL (ref 8.4–10.5)
Chloride: 101 mEq/L (ref 96–112)
Creatinine, Ser: 0.87 mg/dL (ref 0.40–1.20)
GFR: 85.53 mL/min (ref >60.00)
Glucose, Bld: 86 mg/dL (ref 70–99)
Potassium: 4.2 mEq/L (ref 3.5–5.1)
Sodium: 136 mEq/L (ref 135–145)
Total Bilirubin: 0.4 mg/dL (ref 0.2–1.2)
Total Protein: 7.3 g/dL (ref 6.0–8.3)

## 2020-12-29 LAB — CBC WITH DIFFERENTIAL/PLATELET
Basophils Relative: 0.7 % (ref 0.0–3.0)
Eosinophils Relative: 0.8 % (ref 0.0–5.0)
HCT: 36 % (ref 36.0–46.0)
Hemoglobin: 12.2 g/dL (ref 12.0–15.0)
Lymphocytes Relative: 24.8 % (ref 12.0–46.0)
MCHC: 33.8 g/dL (ref 30.0–36.0)
MCV: 84.9 fl (ref 78.0–100.0)
Monocytes Relative: 7.7 % (ref 3.0–12.0)
Neutrophils Relative %: 66 % (ref 43.0–77.0)
Platelets: 297 10*3/uL (ref 150.0–400.0)
RBC: 4.24 Mil/uL (ref 3.87–5.11)
RDW: 14.1 % (ref 11.5–15.5)
WBC: 5.9 10*3/uL (ref 4.0–10.5)

## 2020-12-29 LAB — HEMOGLOBIN A1C: Hgb A1c MFr Bld: 5.5 % (ref 4.6–6.5)

## 2020-12-29 LAB — LIPASE: Lipase: 24 U/L (ref 11.0–59.0)

## 2020-12-29 MED ORDER — PANTOPRAZOLE SODIUM 40 MG PO TBEC: 40.0000 mg | DELAYED_RELEASE_TABLET | Freq: Every day | ORAL | 0 refills | Status: DC

## 2020-12-29 NOTE — Patient Instructions (Signed)
Labs today and I will send results through [facility]. Try the Protonix 30 minutes prior to breakfast daily. If no improvement in the next few weeks and labs unremarkable, we will do U/S abdomen next.

## 2020-12-29 NOTE — Progress Notes (Signed)
Acute Office Visit  Subjective:    Patient ID: Rebecca Holt, female    DOB: 1986-05-14, 35 y.o.   MRN: [id number]  Chief Complaint  Patient presents with   Nausea    HPI Patient is in today for nausea since having COVID-19 in the middle of March 2022. Comes and goes in waves of intensity. Feels like morning sickness. Occasionally makes her vomit. Lessens her appetite. Zofran has not helped. Promethazine makes her very tired. Dramamine, Ginger, crackers, bland food, probiotics, Famotidine OTC all not helping either. She is not pregnant. LMP was 12/25/20. No hx of these symptoms prior to COVID-19.  Past Medical History:  Diagnosis Date   Anxiety    Bipolar disorder ([place])    Chicken pox    Depressive disorder 02/08/2019   Hypothyroidism    Pneumonia     Past Surgical History:  Procedure Laterality Date   TONSILLECTOMY Bilateral    TRANSFORAMINAL LUMBAR INTERBODY FUSION (TLIF) WITH PEDICLE SCREW FIXATION 1 LEVEL N/A 05/07/2019   Procedure: Lumbar Five- Sacral One Transforaminal lumbar interbody Fusion;  Surgeon: Erline Levine, MD;  Location: [facility];  Service: Neurosurgery;  Laterality: N/A;  Lumbar 5 Sacral 1 Transforaminal lumbar interbody fusion    Family History  Problem Relation Age of Onset   Diabetic kidney disease Father    Hyperlipidemia Father    Diabetes Father    Bipolar disorder Mother    Mental illness Mother        bipolar   Cancer Paternal Grandfather        colon   Diabetes Paternal Grandfather    Thyroid cancer Maternal Grandmother    Cancer Maternal Grandfather        leukemia   Bipolar disorder Son    Anesthesia problems Neg Hx    Hypotension Neg Hx    Malignant hyperthermia Neg Hx    Pseudochol deficiency Neg Hx     Social History   Socioeconomic History   Marital status: Married    Spouse name: Not on file   Number of children: Not on file   Years of education: Not on file   Highest education level: Not on file  Occupational History   Not on  file  Tobacco Use   Smoking status: Never   Smokeless tobacco: Never  Vaping Use   Vaping Use: Never used  Substance and Sexual Activity   Alcohol use: No   Drug use: No   Sexual activity: Yes    Birth control/protection: None    Comment: tubal after   Other Topics Concern   Not on file  Social History Narrative   Not on file   Social Determinants of Health   Financial Resource Strain: Not on file  Food Insecurity: Not on file  Transportation Needs: Not on file  Physical Activity: Not on file  Stress: Not on file  Social Connections: Not on file  Intimate Partner Violence: Not on file    Outpatient Medications Prior to Visit  Medication Sig Dispense Refill   ALPRAZolam (XANAX) 0.5 MG tablet TAKE ONE TABLET BY MOUTH TWO TIMES A DAY AS NEEDED FOR ANXIETY 45 tablet 3   buPROPion (WELLBUTRIN XL) 150 MG 24 hr tablet Take 1 tablet (150 mg total) by mouth daily. 30 tablet 2   cholecalciferol (VITAMIN D3) 25 MCG (1000 UNIT) tablet Take 1,000 Units by mouth daily.     eletriptan (RELPAX) 40 MG tablet Take 1 tablet (40 mg total) by mouth as needed for migraine  or headache. May repeat in 2 hours if headache persists or recurs. 10 tablet 0   Ferrous Sulfate (IRON PO) Take by mouth.     lamoTRIgine (LAMICTAL) 25 MG tablet Take 3 tablets (75 mg total) by mouth daily. 90 tablet 2   modafinil (PROVIGIL) 200 MG tablet Take 1/2-1 tablet po qd 30 tablet 2   Pyridoxine HCl (VITAMIN B6 PO) Take by mouth.     amoxicillin (AMOXIL) 875 MG tablet Take 1 tablet (875 mg total) by mouth 2 (two) times daily. (Patient not taking: Reported on 09/18/2020) 20 tablet 0   fluticasone (FLONASE) 50 MCG/ACT nasal spray Place 2 sprays into both nostrils daily. (Patient not taking: No sig reported) 16 g 2   promethazine (PHENERGAN) 25 MG tablet Take 1 tablet (25 mg total) by mouth every 6 (six) hours as needed for nausea or vomiting. (Patient not taking: Reported on 09/18/2020) 20 tablet 0   No facility-administered  medications prior to visit.    Allergies  Allergen Reactions   Tramadol Itching   Latex Other (See Comments)    Causes irritation.    Review of Systems  Constitutional:  Positive for appetite change. Negative for chills, fatigue and fever.  Respiratory:  Negative for shortness of breath.   Cardiovascular:  Negative for chest pain.  Gastrointestinal:  Positive for nausea and vomiting (intermittent). Negative for abdominal distention, abdominal pain, blood in stool, constipation and diarrhea.  Genitourinary:  Negative for dysuria and menstrual problem (LMP was 12/25/20; normal).  Musculoskeletal:  Negative for back pain.      Objective:    Physical Exam Vitals and nursing note reviewed.  Constitutional:      Appearance: Normal appearance. She is normal weight. She is not toxic-appearing.  HENT:     Head: Normocephalic and atraumatic.     Right Ear: Ear canal and external ear normal.     Left Ear: Ear canal and external ear normal.     Nose: Nose normal.     Mouth/Throat:     Mouth: Mucous membranes are moist.  Eyes:     Extraocular Movements: Extraocular movements intact.     Conjunctiva/sclera: Conjunctivae normal.     Pupils: Pupils are equal, round, and reactive to light.  Cardiovascular:     Rate and Rhythm: Normal rate and regular rhythm.     Pulses: Normal pulses.     Heart sounds: Normal heart sounds.  Pulmonary:     Effort: Pulmonary effort is normal.     Breath sounds: Normal breath sounds.  Abdominal:     General: Abdomen is flat. Bowel sounds are normal. There is no distension.     Palpations: Abdomen is soft. There is no mass.     Tenderness: There is abdominal tenderness (mild TTP periumbilicus). There is no right CVA tenderness, left CVA tenderness, guarding or rebound.     Hernia: No hernia is present.  Musculoskeletal:        General: Normal range of motion.     Cervical back: Normal range of motion and neck supple.  Skin:    General: Skin is warm and  dry.  Neurological:     General: No focal deficit present.     Mental Status: She is alert and oriented to person, place, and time.  Psychiatric:        Mood and Affect: Mood normal.        Behavior: Behavior normal.        Thought Content: Thought content normal.  Judgment: Judgment normal.    BP 130/87   Pulse 85   Temp 98.1 F (36.7 C)   Ht 5\' 7"  (1.702 m)   Wt 178 lb 3.2 oz (80.8 kg)   LMP 12/25/2020   SpO2 100%   BMI 27.91 kg/m  Wt Readings from Last 3 Encounters:  12/29/20 178 lb 3.2 oz (80.8 kg)  09/09/20 180 lb (81.6 kg)  07/31/20 186 lb (84.4 kg)    Health Maintenance Due  Topic Date Due   Hepatitis C Screening  Never done   COVID-19 Vaccine (2 - Booster for Janssen series) 03/14/2020    There are no preventive care reminders to display for this patient.   No results found for: TSH Lab Results  Component Value Date   WBC 7.3 09/09/2020   HGB 12.6 09/09/2020   HCT 39.6 09/09/2020   MCV 83.0 09/09/2020   PLT 320 09/09/2020   Lab Results  Component Value Date   NA 137 09/09/2020   K 3.8 09/09/2020   CO2 24 09/09/2020   GLUCOSE 106 (H) 09/09/2020   BUN 11 09/09/2020   CREATININE 0.80 09/09/2020   BILITOT 0.6 03/16/2019   ALKPHOS 55 03/16/2019   AST 21 03/16/2019   ALT 22 03/16/2019   PROT 7.0 03/16/2019   ALBUMIN 3.9 03/16/2019   CALCIUM 9.1 09/09/2020   ANIONGAP 10 09/09/2020      Assessment & Plan:   Problem List Items Addressed This Visit   None Visit Diagnoses     Nausea in adult    -  Primary   Relevant Orders   CBC with Differential/Platelet   Comprehensive metabolic panel   Lipase   Hemoglobin A1c   H Pylori, IGM, IGG, IGA AB   Periumbilical abdominal pain       Relevant Orders   CBC with Differential/Platelet   Comprehensive metabolic panel   Lipase   Hemoglobin A1c   H Pylori, IGM, IGG, IGA AB   Family history of diabetes mellitus       Relevant Orders   Comprehensive metabolic panel   Hemoglobin A1c         Meds ordered this encounter  Medications   pantoprazole (PROTONIX) 40 MG tablet    Sig: Take 1 tablet (40 mg total) by mouth daily.    Dispense:  30 tablet    Refill:  0   1. Nausea in adult 2. Periumbilical abdominal pain 3. Family history of diabetes mellitus Nausea with intermittent vomiting. Mild mid-abdominal pain noted on exam today as well. Not an acute-abdomen. Symptoms possibly 2/2 COVID-19 illness in March this year. Also need to consider GERD or other pathology. Will check labs today. Also trial of Protonix daily. She will go the ED in the event of any sudden, severe symptoms.   Desree Leap M Kijana Cromie, PA-C

## 2020-12-30 ENCOUNTER — Encounter: Payer: Self-pay | Admitting: Physician Assistant

## 2021-01-02 NOTE — Telephone Encounter (Signed)
Alyssa, please see message and advise.

## 2021-01-02 NOTE — Addendum Note (Signed)
Addended by: Loura Back on: 01/02/2021 01:13 PM   Modules accepted: Orders

## 2021-01-03 ENCOUNTER — Other Ambulatory Visit: Payer: Self-pay | Admitting: Neurosurgery

## 2021-01-03 DIAGNOSIS — Z981 Arthrodesis status: Secondary | ICD-10-CM

## 2021-01-03 NOTE — Telephone Encounter (Signed)
Lab contacted  patient to recollect blood work

## 2021-01-07 ENCOUNTER — Ambulatory Visit
Admission: RE | Admit: 2021-01-07 | Discharge: 2021-01-07 | Disposition: A | Discharge status: Home / Self Care | Payer: 59 | Source: Ambulatory Visit | Attending: Neurosurgery | Admitting: Neurosurgery

## 2021-01-07 DIAGNOSIS — Z981 Arthrodesis status: Secondary | ICD-10-CM

## 2021-01-12 NOTE — Progress Notes (Signed)
Spoke with patient she will call office to schedule a follow up

## 2021-01-14 ENCOUNTER — Other Ambulatory Visit: Payer: Self-pay | Admitting: Psychiatry

## 2021-01-15 NOTE — Telephone Encounter (Signed)
Please schedule appt

## 2021-01-16 NOTE — Telephone Encounter (Signed)
Has upcoming apt

## 2021-01-16 NOTE — Telephone Encounter (Signed)
Pt made appt for 8/23.

## 2021-01-25 ENCOUNTER — Telehealth: Payer: Self-pay

## 2021-01-25 LAB — H.PYLORI BREATH TEST (REFLEX)

## 2021-01-25 LAB — H. PYLORI BREATH TEST

## 2021-01-25 LAB — H PYLORI, IGM, IGG, IGA AB

## 2021-01-25 LAB — SPECIMEN STATUS REPORT

## 2021-01-25 NOTE — Telephone Encounter (Signed)
Received a call from [place] at [facility] they were unable to complete the test for H Pylori due to a lab mixup.

## 2021-01-25 NOTE — Telephone Encounter (Signed)
Alyssa, please see message and advise.

## 2021-01-26 NOTE — Telephone Encounter (Signed)
Appt scheduled

## 2021-01-26 NOTE — Telephone Encounter (Signed)
Patient will call to schedule appt

## 2021-01-31 ENCOUNTER — Telehealth: Payer: Self-pay

## 2021-01-31 NOTE — Telephone Encounter (Signed)
Please call pt and schedule follow up with Dr. Jonni Sanger per her with in the next week or so to discuss GI issues. Also pt needs an appt for her CPE per Dr. Jonni Sanger.

## 2021-01-31 NOTE — Telephone Encounter (Signed)
Patient is wanting to know if she can get a referral to a Gastro as Alyssa advised that would be the next step if blood work came back normal. Please advise.

## 2021-01-31 NOTE — Telephone Encounter (Signed)
Dr. Jonni Sanger, okay to send referral to [facility]?

## 2021-01-31 NOTE — Telephone Encounter (Signed)
LVM to call back and get scheduled.  

## 2021-02-19 ENCOUNTER — Other Ambulatory Visit: Payer: Self-pay | Admitting: Physician Assistant

## 2021-02-19 DIAGNOSIS — R1013 Epigastric pain: Secondary | ICD-10-CM

## 2021-02-19 DIAGNOSIS — R110 Nausea: Secondary | ICD-10-CM

## 2021-02-19 DIAGNOSIS — R1011 Right upper quadrant pain: Secondary | ICD-10-CM

## 2021-02-20 ENCOUNTER — Encounter: Payer: Self-pay | Admitting: Psychiatry

## 2021-02-20 ENCOUNTER — Ambulatory Visit (INDEPENDENT_AMBULATORY_CARE_PROVIDER_SITE_OTHER): Payer: 59 | Admitting: Psychiatry

## 2021-02-20 ENCOUNTER — Other Ambulatory Visit: Payer: Self-pay

## 2021-02-20 DIAGNOSIS — F410 Panic disorder [episodic paroxysmal anxiety] without agoraphobia: Secondary | ICD-10-CM

## 2021-02-20 DIAGNOSIS — F332 Major depressive disorder, recurrent severe without psychotic features: Secondary | ICD-10-CM | POA: Diagnosis not present

## 2021-02-20 DIAGNOSIS — G4700 Insomnia, unspecified: Secondary | ICD-10-CM

## 2021-02-20 DIAGNOSIS — F39 Unspecified mood [affective] disorder: Secondary | ICD-10-CM | POA: Diagnosis not present

## 2021-02-20 MED ORDER — LAMOTRIGINE 25 MG PO TABS: 75.0000 mg | ORAL_TABLET | Freq: Every day | ORAL | 2 refills | Status: DC

## 2021-02-20 MED ORDER — ALPRAZOLAM 0.5 MG PO TABS: ORAL_TABLET | ORAL | 5 refills | Status: DC

## 2021-02-20 MED ORDER — BUPROPION HCL ER (XL) 150 MG PO TB24: 150.0000 mg | ORAL_TABLET | Freq: Every day | ORAL | 2 refills | Status: DC

## 2021-02-20 MED ORDER — MODAFINIL 200 MG PO TABS: ORAL_TABLET | ORAL | 4 refills | Status: DC

## 2021-02-20 NOTE — Progress Notes (Signed)
Darielle Spady [id number] 02/19/1986 35 y.o.  Subjective:   Patient ID:  Rebecca Holt is a 35 y.o. (DOB 12/08/1985) female.  Chief Complaint:  Chief Complaint  Patient presents with   Follow-up    Anxiety, h/o mood disturbance    HPI Rebecca Holt presents to the office today for follow-up of anxiety, mood disturbance, and insomnia. She reports "mood-wise, good." She reports that current meds are effective for her mood. She reports that she has to take Xanax 1/2 tablet before bed due to increased anxiety. Was sleeping 7 hours a night and now this is interrupted due to GI s/s. She reports that she has not had any significant depression other than sadness in response to multiple health issues. Denies elevated moods ot include impulsivity and risky behavior.   She reports that she has had GI s/s and unexplained weight loss. Appetite has been decreased. Energy has been lower. Motivation is ok and reports that she wants to do things. She reports chronic difficulty with concentration. She anticipates concentration improving when children return to school and there are less distractions. She reports improved "brain fog" and increased wakefulness with Modafinil. Denies SI.   She reports that all of her children will be going to school next week. Reports oldest son is dong well and has a part-time job.   Past Psychiatric Medication Trials: Ativan- partially effective Tranxene          Wellbutrin XL- Increased activation/agitation with 450 mg. Reports that Wellbutrin seemed to initially be effective.   Zoloft Lexapro Celexa- Had severe discontinuation Pristiq Abilify Rexulti- Cost prohibitive  Olanzapine Risperidone- Involuntary orofacial movements Trileptal Lamictal- Had increased irritability and insomnia with 100 mg dose.  [place] Office Visit from 06/05/2020 in [facility]  AIMS Total Score 0        Review of Systems:  Review of Systems   Gastrointestinal:  Positive for nausea.       Reports nausea since the beginning of the year.   Genitourinary:  Positive for dyspareunia, menstrual problem and pelvic pain.       Has hysterectomy scheduled for October  Musculoskeletal:  Negative for gait problem.  Psychiatric/Behavioral:         Please refer to HPI   Medications: I have reviewed the patient's current medications.  Current Outpatient Medications  Medication Sig Dispense Refill   cholecalciferol (VITAMIN D3) 25 MCG (1000 UNIT) tablet Take 1,000 Units by mouth daily.     LYSINE PO Take by mouth.     Pyridoxine HCl (VITAMIN B6 PO) Take by mouth.     [START ON 02/26/2021] ALPRAZolam (XANAX) 0.5 MG tablet TAKE ONE TABLET BY MOUTH TWO TIMES A DAY AS NEEDED FOR ANXIETY 45 tablet 5   buPROPion (WELLBUTRIN XL) 150 MG 24 hr tablet Take 1 tablet (150 mg total) by mouth daily. 90 tablet 2   eletriptan (RELPAX) 40 MG tablet Take 1 tablet (40 mg total) by mouth as needed for migraine or headache. May repeat in 2 hours if headache persists or recurs. 10 tablet 0   Ferrous Sulfate (IRON PO) Take by mouth.     lamoTRIgine (LAMICTAL) 25 MG tablet Take 3 tablets (75 mg total) by mouth daily. 90 tablet 2   [START ON 03/20/2021] modafinil (PROVIGIL) 200 MG tablet TAKE 1/2 TO 1 TABLET BY MOUTH DAILY 30 tablet 4   pantoprazole (PROTONIX) 40 MG tablet Take 1 tablet (40 mg total) by mouth daily. 30 tablet  0   No current facility-administered medications for this visit.    Medication Side Effects: None  Allergies:  Allergies  Allergen Reactions   Tramadol Itching   Latex Other (See Comments)    Causes irritation.    Past Medical History:  Diagnosis Date   Anxiety    Bipolar disorder ([place])    Chicken pox    Depressive disorder 02/08/2019   Hypothyroidism    Pneumonia     Past Medical History, Surgical history, Social history, and Family history were reviewed and updated as appropriate.   Please see review of systems for further  details on the patient's review from today.   Objective:   Physical Exam:  BP 124/83   Pulse 72   Wt 165 lb (74.8 kg)   BMI 25.84 kg/m   Physical Exam Constitutional:      General: She is not in acute distress. Musculoskeletal:        General: No deformity.  Neurological:     Mental Status: She is alert and oriented to person, place, and time.     Coordination: Coordination normal.  Psychiatric:        Attention and Perception: Attention and perception normal. She does not perceive auditory or visual hallucinations.        Mood and Affect: Mood normal. Mood is not anxious or depressed. Affect is not labile, blunt, angry or inappropriate.        Speech: Speech normal.        Behavior: Behavior normal.        Thought Content: Thought content normal. Thought content is not paranoid or delusional. Thought content does not include homicidal or suicidal ideation. Thought content does not include homicidal or suicidal plan.        Cognition and Memory: Cognition and memory normal.        Judgment: Judgment normal.     Comments: Insight intact    Lab Review:     Component Value Date/Time   NA 136 12/29/2020 1117   K 4.2 12/29/2020 1117   CL 101 12/29/2020 1117   CO2 27 12/29/2020 1117   GLUCOSE 86 12/29/2020 1117   BUN 9 12/29/2020 1117   CREATININE 0.87 12/29/2020 1117   CALCIUM 9.5 12/29/2020 1117   PROT 7.3 12/29/2020 1117   ALBUMIN 4.6 12/29/2020 1117   AST 20 12/29/2020 1117   ALT 18 12/29/2020 1117   ALKPHOS 49 12/29/2020 1117   BILITOT 0.4 12/29/2020 1117   GFRNONAA >60 09/09/2020 1344   GFRAA >60 03/16/2019 0926       Component Value Date/Time   WBC 5.9 12/29/2020 1117   RBC 4.24 12/29/2020 1117   HGB 12.2 12/29/2020 1117   HCT 36.0 12/29/2020 1117   PLT 297.0 12/29/2020 1117   MCV 84.9 12/29/2020 1117   MCH 26.4 09/09/2020 1344   MCHC 33.8 12/29/2020 1117   RDW 14.1 12/29/2020 1117   LYMPHSABS 1.6 03/16/2019 0926   MONOABS 0.4 03/16/2019 0926   EOSABS  0.1 03/16/2019 0926   BASOSABS 0.0 03/16/2019 0926    No results found for: POCLITH, LITHIUM   No results found for: PHENYTOIN, PHENOBARB, VALPROATE, CBMZ   .res Assessment: Plan:   Will continue current plan of care since target signs and symptoms are well controlled without any tolerability issues. Continue Xanax 0.5 mg po BID prn anxiety.  Continue Wellbutrin XL 150 mg po qd for depression.  Continue Lamictal 75 mg po qd for mood s/s. Continue Modafinil 200  mg 1/2-1 tab po qd for energy and concentration.  Pt to follow-up in 6 months or sooner if clinically indicated.  Patient advised to contact office with any questions, adverse effects, or acute worsening in signs and symptoms.   Rebecca Holt was seen today for follow-up.  Diagnoses and all orders for this visit:  Insomnia, unspecified type -     ALPRAZolam (XANAX) 0.5 MG tablet; TAKE ONE TABLET BY MOUTH TWO TIMES A DAY AS NEEDED FOR ANXIETY  Panic attacks -     ALPRAZolam (XANAX) 0.5 MG tablet; TAKE ONE TABLET BY MOUTH TWO TIMES A DAY AS NEEDED FOR ANXIETY  Severe episode of recurrent major depressive disorder, without psychotic features (HCC) -     buPROPion (WELLBUTRIN XL) 150 MG 24 hr tablet; Take 1 tablet (150 mg total) by mouth daily.  Episodic mood disorder (HCC) -     lamoTRIgine (LAMICTAL) 25 MG tablet; Take 3 tablets (75 mg total) by mouth daily.  Other orders -     modafinil (PROVIGIL) 200 MG tablet; TAKE 1/2 TO 1 TABLET BY MOUTH DAILY    Please see After Visit Summary for patient specific instructions.  Future Appointments  Date Time Provider [place]  02/22/2021  9:00 AM [facility] [place] [facility]  08/23/2021 10:00 AM Thayer Headings, PMHNP [facility] None    No orders of the defined types were placed in this encounter.   -------------------------------

## 2021-02-22 ENCOUNTER — Ambulatory Visit
Admission: RE | Admit: 2021-02-22 | Discharge: 2021-02-22 | Disposition: A | Discharge status: Home / Self Care | Payer: 59 | Source: Ambulatory Visit | Attending: Physician Assistant | Admitting: Physician Assistant

## 2021-02-22 DIAGNOSIS — R1013 Epigastric pain: Secondary | ICD-10-CM

## 2021-02-22 DIAGNOSIS — R110 Nausea: Secondary | ICD-10-CM

## 2021-02-22 DIAGNOSIS — R1011 Right upper quadrant pain: Secondary | ICD-10-CM

## 2021-03-23 DIAGNOSIS — K8681 Exocrine pancreatic insufficiency: Secondary | ICD-10-CM | POA: Insufficient documentation

## 2021-04-09 ENCOUNTER — Encounter: Payer: Self-pay | Admitting: Physician Assistant

## 2021-04-09 ENCOUNTER — Telehealth: Payer: 59 | Admitting: Physician Assistant

## 2021-04-09 VITALS — Temp 99.8°F | Ht 67.0 in

## 2021-04-09 DIAGNOSIS — J101 Influenza due to other identified influenza virus with other respiratory manifestations: Secondary | ICD-10-CM | POA: Diagnosis not present

## 2021-04-09 MED ORDER — OSELTAMIVIR PHOSPHATE 75 MG PO CAPS: 75.0000 mg | ORAL_CAPSULE | Freq: Two times a day (BID) | ORAL | 0 refills | Status: DC

## 2021-04-09 NOTE — Progress Notes (Signed)
Virtual Visit via Video Note  I connected with  Rebecca Holt  on 04/09/21 at 11:30 AM EDT by a video enabled telemedicine application and verified that I am speaking with the correct person using two identifiers.  Location: Patient: home Provider: Therapist, music at [place] present: Patient and myself   I discussed the limitations of evaluation and management by telemedicine and the availability of in person appointments. The patient expressed understanding and agreed to proceed.   History of Present Illness: Chief complaint: Possible flu Symptom onset: 04/07/21 Pertinent positives: Body aches, fatigue, ST, fever, HA Pertinent negatives: Cough, CP, SOB  Treatments tried: Aleve, Tylenol  Sick exposure: Oldest child tested positive for flu A last week and middle son now has same symptoms.     Observations/Objective:   Gen: Awake, alert, no acute distress; general malaise Resp: Breathing is even and non-labored Psych: calm/pleasant demeanor Neuro: Alert and Oriented x 3, + facial symmetry, speech is clear.   Assessment and Plan:  1. Influenza A -Household positive exposure, patient now with same symptoms -Start on Tamiflu today as she is still within 5 days since onset of symptoms, possible side effects discussed -REST, tylenol / motrin, fluids -ED if acutely worse -Pt to call with updates    Follow Up Instructions:    I discussed the assessment and treatment plan with the patient. The patient was provided an opportunity to ask questions and all were answered. The patient agreed with the plan and demonstrated an understanding of the instructions.   The patient was advised to call back or seek an in-person evaluation if the symptoms worsen or if the condition fails to improve as anticipated.  Greenly Rarick M Emerald Gehres, PA-C

## 2021-04-11 DIAGNOSIS — N941 Unspecified dyspareunia: Secondary | ICD-10-CM | POA: Insufficient documentation

## 2021-04-11 DIAGNOSIS — N946 Dysmenorrhea, unspecified: Secondary | ICD-10-CM | POA: Insufficient documentation

## 2021-04-12 ENCOUNTER — Encounter (HOSPITAL_BASED_OUTPATIENT_CLINIC_OR_DEPARTMENT_OTHER): Payer: Self-pay | Admitting: Obstetrics and Gynecology

## 2021-04-17 ENCOUNTER — Other Ambulatory Visit: Payer: Self-pay

## 2021-04-17 ENCOUNTER — Encounter (HOSPITAL_BASED_OUTPATIENT_CLINIC_OR_DEPARTMENT_OTHER): Payer: Self-pay | Admitting: Obstetrics and Gynecology

## 2021-04-17 DIAGNOSIS — N946 Dysmenorrhea, unspecified: Secondary | ICD-10-CM | POA: Diagnosis not present

## 2021-04-17 DIAGNOSIS — Z01812 Encounter for preprocedural laboratory examination: Secondary | ICD-10-CM | POA: Diagnosis present

## 2021-04-17 NOTE — Progress Notes (Signed)
YOU ARE SCHEDULED FOR A COVID TEST ON  04-19-2021   . THIS TEST MUST BE DONE BEFORE SURGERY. GO TO  [facility] @ [place]   PHONE NUMBER [phone].   TURN LEFT AT THE SHIPPING AND RECEIVING SIGN AND LOOK FOR SMALL BLUE POP UP TENT UNDER BUILDING OVERHANG AT BACK OF BUILDING AND REMAIN IN YOUR CAR, THIS IS A DRIVE UP TEST.  AFTER YOUR COVID TEST , PLEASE WEAR A MASK OUT IN PUBLIC AND SOCIAL DISTANCE AND [place] YOUR HANDS FREQUENTLY. PLEASE ASK ALL YOUR CLOSE HOUSEHOLD CONTACT TO WEAR MASK OUT IN PUBLIC AND SOCIAL DISTANCE AND [place] HANDS FREQUENTLY ALSO.      Your procedure is scheduled on 04-23-2021  Report to [facility].   Call this number if you have problems the morning of surgery  :[phone].   OUR ADDRESS IS [address].  WE ARE LOCATED IN [facility].  PLEASE BRING YOUR INSURANCE CARD AND PHOTO ID DAY OF SURGERY.  ONLY ONE PERSON ALLOWED IN FACILITY WAITING AREA.                                     REMEMBER:  DO NOT EAT FOOD, CANDY GUM OR MINTS  AFTER MIDNIGHT . YOU MAY HAVE CLEAR LIQUIDS FROM MIDNIGHT UNTIL 430 AM. NO CLEAR LIQUIDS AFTER 430 AM DAY OF SURGERY.   YOU MAY  BRUSH YOUR TEETH MORNING OF SURGERY AND RINSE YOUR MOUTH OUT, NO CHEWING GUM CANDY OR MINTS.    CLEAR LIQUID DIET   Foods Allowed                                                                     Foods Excluded  Coffee and tea, regular and decaf                             liquids that you cannot  Plain Jell-O any favor except red or purple                                           see through such as: Fruit ices (not with fruit pulp)                                     milk, soups, orange juice  Iced Popsicles                                    All solid food Carbonated beverages, regular and diet                                    Cranberry, grape and apple juices Sports drinks like Gatorade Lightly  seasoned clear broth  or consume(fat free) Sugar  Sample Menu Breakfast                                Lunch                                     Supper Cranberry juice                    Beef broth                            Chicken broth Jell-O                                     Grape juice                           Apple juice Coffee or tea                        Jell-O                                      Popsicle                                                Coffee or tea                        Coffee or tea  _____________________________________________________________________     TAKE THESE MEDICATIONS MORNING OF SURGERY WITH A SIP OF WATER:   ALPRAZOLAM IF NEEDED, LAMICTAL, BUPROPION.  ONE VISITOR IS ALLOWED IN WAITING ROOM ONLY DAY OF SURGERY.  YOU MAY HAVE ANOTHER PERSON SWITCH OUT WITH THE  1  VISITOR IN THE WAITING ROOM DAY OF SURGERY AND A MASK MUST BE WORN IN THE WAITING ROOM.    2 VISITORS  MAY VISIT IN THE EXTENDED RECOVERY ROOM UNTIL 800 PM ONLY 1 VISITOR AGE 37 AND OVER MAY SPEND THE NIGHT AND MUST BE IN EXTENDED RECOVERY ROOM NO LATER THAN 800 PM .   UP TO 2 CHILDREN AGE 37 TO 15 MAY ALSO VISIT IN EXTENDED RECOVERY ROOM ONLY UNTIL 800 PM AND MUST LEAVE BY 800 PM. ALL PERSONS VISITING IN EXTENDED RECOVERY ROOM MUST WEAR A MASK.                                    DO NOT WEAR JEWERLY, MAKE UP. DO NOT WEAR LOTIONS, POWDERS, PERFUMES OR NAIL POLISH. DO NOT SHAVE FOR 48 HOURS PRIOR TO DAY OF SURGERY. MEN MAY SHAVE FACE AND NECK. CONTACTS, GLASSES, OR DENTURES MAY NOT BE WORN TO SURGERY.                                    [facility] IS NOT RESPONSIBLE  FOR ANY BELONGINGS.                                                                    Marland Kitchen           [facility] - Preparing for Surgery Before surgery, you can play an important role.  Because skin is not sterile, your skin needs to be as free of germs as possible.  You can reduce the number of germs on your skin by washing  with CHG (chlorahexidine gluconate) soap before surgery.  CHG is an antiseptic cleaner which kills germs and bonds with the skin to continue killing germs even after washing. Please DO NOT use if you have an allergy to CHG or antibacterial soaps.  If your skin becomes reddened/irritated stop using the CHG and inform your nurse when you arrive at Short Stay. Do not shave (including legs and underarms) for at least 48 hours prior to the first CHG shower.  You may shave your face/neck. Please follow these instructions carefully:  1.  Shower with CHG Soap the night before surgery and the  morning of Surgery.  2.  If you choose to wash your hair, wash your hair first as usual with your  normal  shampoo.  3.  After you shampoo, rinse your hair and body thoroughly to remove the  shampoo.                            4.  Use CHG as you would any other liquid soap.  You can apply chg directly  to the skin and wash                      Gently with a scrungie or clean washcloth.  5.  Apply the CHG Soap to your body ONLY FROM THE NECK DOWN.   Do not use on face/ open                           Wound or open sores. Avoid contact with eyes, ears mouth and genitals (private parts).                       Wash face,  Genitals (private parts) with your normal soap.             6.  Wash thoroughly, paying special attention to the area where your surgery  will be performed.  7.  Thoroughly rinse your body with warm water from the neck down.  8.  DO NOT shower/wash with your normal soap after using and rinsing off  the CHG Soap.                9.  Pat yourself dry with a clean towel.            10.  Wear clean pajamas.            11.  Place clean sheets on your bed the night of your first shower and do not  sleep with pets. Day of Surgery : Do not apply any lotions/deodorants the morning of surgery.  Please wear clean clothes to the  hospital/surgery center.  FAILURE TO FOLLOW THESE INSTRUCTIONS MAY RESULT IN THE  CANCELLATION OF YOUR SURGERY PATIENT SIGNATURE_________________________________  NURSE SIGNATURE__________________________________  ________________________________________________________________________                                                        QUESTIONS Hansel Feinstein PRE OP NURSE PHONE [phone].

## 2021-04-17 NOTE — Progress Notes (Addendum)
Spoke w/ via phone for pre-op interview---pt Lab needs dos----     urine poct          Lab results------lab appt 04-19-2021 fpr cbc bmp t & s, EKG 09-09-2020  chart/Epic, chest xray 09-09-2020 chart/epic COVID test ----04-19-2021 overnight stay Arrive at -------530 am 04-23-2021 NPO after MN NO Solid Food.  Clear liquids from MN until---430 am Med rec completed Medications to take morning of surgery -----alprazolam prn, burpopion, lamictal Diabetic medication -----n/a Patient instructed no nail polish to be worn day of surgery Patient instructed to bring photo id and insurance card day of surgery Patient aware to have Driver (ride ) / caregiver    for 24 hours after surgery spouse lawrence Patient Special Instructions -----pt given overnight stay instructions Pre-Op special Istructions -----none Patient verbalized understanding of instructions that were given at this phone interview. Patient denies shortness of breath, chest pain, fever, cough at this phone interview.

## 2021-04-19 ENCOUNTER — Other Ambulatory Visit: Payer: Self-pay | Admitting: Obstetrics and Gynecology

## 2021-04-19 ENCOUNTER — Other Ambulatory Visit: Payer: Self-pay

## 2021-04-19 ENCOUNTER — Encounter (HOSPITAL_COMMUNITY)
Admission: RE | Admit: 2021-04-19 | Discharge: 2021-04-19 | Disposition: A | Discharge status: Home / Self Care | Payer: 59 | Source: Ambulatory Visit | Attending: Obstetrics and Gynecology | Admitting: Obstetrics and Gynecology

## 2021-04-19 DIAGNOSIS — Z01812 Encounter for preprocedural laboratory examination: Secondary | ICD-10-CM | POA: Insufficient documentation

## 2021-04-19 DIAGNOSIS — N946 Dysmenorrhea, unspecified: Secondary | ICD-10-CM | POA: Insufficient documentation

## 2021-04-19 LAB — BASIC METABOLIC PANEL
Anion gap: 8 (ref 5–15)
BUN: 10 mg/dL (ref 6–20)
CO2: 27 mmol/L (ref 22–32)
Calcium: 9.3 mg/dL (ref 8.9–10.3)
Chloride: 102 mmol/L (ref 98–111)
Creatinine, Ser: 0.77 mg/dL (ref 0.44–1.00)
GFR, Estimated: >60 mL/min (ref >60)
Glucose, Bld: 98 mg/dL (ref 70–99)
Potassium: 3.8 mmol/L (ref 3.5–5.1)
Sodium: 137 mmol/L (ref 135–145)

## 2021-04-19 LAB — CBC
HCT: 37.2 % (ref 36.0–46.0)
Hemoglobin: 11.9 g/dL — ABNORMAL LOW (ref 12.0–15.0)
MCH: 28.6 pg (ref 26.0–34.0)
MCHC: 32 g/dL (ref 30.0–36.0)
MCV: 89.4 fL (ref 80.0–100.0)
Platelets: 265 10*3/uL (ref 150–400)
RBC: 4.16 MIL/uL (ref 3.87–5.11)
RDW: 12.6 % (ref 11.5–15.5)
WBC: 5.5 10*3/uL (ref 4.0–10.5)
nRBC: 0 % (ref 0.0–0.2)

## 2021-04-19 LAB — SARS CORONAVIRUS 2 (TAT 6-24 HRS): SARS Coronavirus 2: NEGATIVE

## 2021-04-20 NOTE — H&P (Signed)
Rebecca Holt is an 35 y.o. female [phone] who presents for scheduled LAVH/BS, poss oophorectomies, cysto, poss A&P repair for issues with abnormal bleeding and pelvic pain.  She was also having weight loss and recent dx of excocrine pancreatic insufficiency - is taking Zenpep with every meal and snack - helping. Diarrhea improved and nausea improved and feels ready to address ongoing gyn issues with definitive management.  .  Pt has had a longstanding problem with painful periods. Menses are about q 21-22 days but irregular, lasting 6 days. She has breakthrough spotting. The pain with menses is bad for 3-4 days but just deals with it because NSAIDs don't work. She continues to have pain with intercourse regardless of position. Some constant left-sided pain as well.  No major stress incontinence.   Pertinent Gynecological History:  Last pap: normal Date: 2020 NSVD x 3  largest 7#9oz  Menstrual History:  Patient's last menstrual period was 04/10/2021 (approximate).    Past Medical History:  Diagnosis Date   Anxiety    Arthritis    si joints   Bipolar disorder ([place])    Chicken pox    as child   Depressive disorder 02/08/2019   History of COVID-19 09/09/2020   g i issues x 2-3 weeks all symptoms reolved   History of hypothyroidism    yrs ago per pt on 04-17-2021   Pneumonia    yrs ago per pt on 04-17-2021   Wears glasses     Past Surgical History:  Procedure Laterality Date   TONSILLECTOMY Bilateral 2020   TRANSFORAMINAL LUMBAR INTERBODY FUSION (TLIF) WITH PEDICLE SCREW FIXATION 1 LEVEL N/A 05/07/2019   Procedure: Lumbar Five- Sacral One Transforaminal lumbar interbody Fusion;  Surgeon: Erline Levine, MD;  Location: [facility];  Service: Neurosurgery;  Laterality: N/A;  Lumbar 5 Sacral 1 Transforaminal lumbar interbody fusion    Family History  Problem Relation Age of Onset   Diabetic kidney disease Father    Hyperlipidemia Father    Diabetes Father    Bipolar disorder Mother     Mental illness Mother        bipolar   Cancer Paternal Grandfather        colon   Diabetes Paternal Grandfather    Thyroid cancer Maternal Grandmother    Cancer Maternal Grandfather        leukemia   Bipolar disorder Son    Anesthesia problems Neg Hx    Hypotension Neg Hx    Malignant hyperthermia Neg Hx    Pseudochol deficiency Neg Hx     Social History:  reports that she has never smoked. She has never used smokeless tobacco. She reports that she does not drink alcohol and does not use drugs.  Allergies:  Allergies  Allergen Reactions   Tramadol Itching   Latex Other (See Comments)    Causes irritation.    No medications prior to admission.    Review of Systems  Constitutional:  Negative for fever.  Genitourinary:  Positive for dyspareunia, menstrual problem and pelvic pain.   Height 5\' 7"  (1.702 m), weight 70.3 kg, last menstrual period 04/10/2021. Physical Exam Constitutional:      Appearance: Normal appearance.  Cardiovascular:     Rate and Rhythm: Normal rate and regular rhythm.     Heart sounds: Normal heart sounds.  Pulmonary:     Effort: Pulmonary effort is normal.  Abdominal:     Palpations: Abdomen is soft.  Genitourinary:    General: Normal vulva.  Musculoskeletal:  Cervical back: Normal range of motion.  Neurological:     Mental Status: She is alert.  Psychiatric:        Mood and Affect: Mood normal.  Pelvic: Vulva: normal, no excoriation Mons: normal, no erythema, no excoriation Labia Majora: normal, no erythema, no excoriation Labia Minora: normal, no erythema, no excoriation Vagina: no discharge, no blood present, no atrophy, no lesions, cystocele stage 1, rectocele stage 2 Cervix: grossly normal, no lesions, no discharge (good descensus) Uterus: normal size, normal contour, retroverted Adnexa/Parametria: no mass palpable, no tenderness  No results found for this or any previous visit (from the past 24 hour(s)).  No results  found.  Assessment/Plan: We discussed LAVH/BS and leaving in ovaries in detail and she would like to proceed with this option. If the left ovary has significant pathology she would like removed, given pain she has on that side. She has a small rectocele and will need a posterior repair but a minimal cystocele.  The patient was counseled regarding the risks of laparoscopic assisted vaginal hysterectomy, BS/anterior and posterior repair and cystoscopy. The procedure was reviewed in detail and expectations regarding recovery.  Risks of bleeding, infection and possible damage to bowel and bladder were reviewed.  The patient understands that should a complication arise she would likely need a larger abdominal incision  and that this would delay her recovery.  She would accept a blood transfusion if needed.  We also discussed removal of the fallopian tubes as a means of possibly reducing future risk of ovarian cancer and she is agreeable to this.  She will retain her ovaries unless there is obvious pathology that would warrant removal.  She is ready to proceed.  [name] 04/20/2021, 7:02 PM

## 2021-04-22 ENCOUNTER — Encounter (HOSPITAL_BASED_OUTPATIENT_CLINIC_OR_DEPARTMENT_OTHER): Payer: Self-pay | Admitting: Obstetrics and Gynecology

## 2021-04-22 NOTE — Anesthesia Preprocedure Evaluation (Addendum)
Anesthesia Evaluation  Patient identified by MRN, date of birth, ID band Patient awake    Reviewed: Allergy & Precautions, NPO status , Patient's Chart, lab work & pertinent test results  Airway Mallampati: II  TM Distance: >3 FB Neck ROM: Full    Dental no notable dental hx. (+) Dental Advisory Given, Teeth Intact   Pulmonary pneumonia, resolved,    Pulmonary exam normal breath sounds clear to auscultation       Cardiovascular negative cardio ROS Normal cardiovascular exam Rhythm:Regular Rate:Normal     Neuro/Psych PSYCHIATRIC DISORDERS Anxiety Depression Bipolar Disorder Hx/o panic attacksnegative neurological ROS     GI/Hepatic Neg liver ROS, Rectocele    Endo/Other  Hypothyroidism   Renal/GU negative Renal ROS   Cystocele     Musculoskeletal  (+) Arthritis , Osteoarthritis,    Abdominal   Peds  Hematology negative hematology ROS (+)   Anesthesia Other Findings   Reproductive/Obstetrics Dysmenorrhea                             Anesthesia Physical Anesthesia Plan  ASA: 2  Anesthesia Plan: General   Post-op Pain Management:    Induction: Intravenous  PONV Risk Score and Plan: 4 or greater and Treatment may vary due to age or medical condition, Scopolamine patch - Pre-op, Midazolam, Dexamethasone and Ondansetron  Airway Management Planned: Oral ETT  Additional Equipment:   Intra-op Plan:   Post-operative Plan: Extubation in OR  Informed Consent: I have reviewed the patients History and Physical, chart, labs and discussed the procedure including the risks, benefits and alternatives for the proposed anesthesia with the patient or authorized representative who has indicated his/her understanding and acceptance.     Dental advisory given  Plan Discussed with: CRNA and Anesthesiologist  Anesthesia Plan Comments:        Anesthesia Quick Evaluation

## 2021-04-23 ENCOUNTER — Encounter (HOSPITAL_BASED_OUTPATIENT_CLINIC_OR_DEPARTMENT_OTHER)
Admission: RE | Disposition: A | Discharge status: Home / Self Care | Payer: Self-pay | Source: Home / Self Care | Attending: Obstetrics and Gynecology

## 2021-04-23 ENCOUNTER — Ambulatory Visit (HOSPITAL_BASED_OUTPATIENT_CLINIC_OR_DEPARTMENT_OTHER)
Admission: RE | Admit: 2021-04-23 | Discharge: 2021-04-24 | Disposition: A | Discharge status: Home / Self Care | Payer: 59 | Attending: Obstetrics and Gynecology | Admitting: Obstetrics and Gynecology

## 2021-04-23 ENCOUNTER — Ambulatory Visit (HOSPITAL_BASED_OUTPATIENT_CLINIC_OR_DEPARTMENT_OTHER): Payer: 59 | Admitting: Anesthesiology

## 2021-04-23 ENCOUNTER — Encounter (HOSPITAL_BASED_OUTPATIENT_CLINIC_OR_DEPARTMENT_OTHER): Payer: Self-pay | Admitting: Obstetrics and Gynecology

## 2021-04-23 DIAGNOSIS — N946 Dysmenorrhea, unspecified: Secondary | ICD-10-CM | POA: Insufficient documentation

## 2021-04-23 DIAGNOSIS — N816 Rectocele: Secondary | ICD-10-CM | POA: Diagnosis not present

## 2021-04-23 DIAGNOSIS — D259 Leiomyoma of uterus, unspecified: Secondary | ICD-10-CM | POA: Diagnosis not present

## 2021-04-23 DIAGNOSIS — N941 Unspecified dyspareunia: Secondary | ICD-10-CM | POA: Insufficient documentation

## 2021-04-23 DIAGNOSIS — Z8616 Personal history of COVID-19: Secondary | ICD-10-CM | POA: Insufficient documentation

## 2021-04-23 DIAGNOSIS — K8681 Exocrine pancreatic insufficiency: Secondary | ICD-10-CM | POA: Insufficient documentation

## 2021-04-23 DIAGNOSIS — N736 Female pelvic peritoneal adhesions (postinfective): Secondary | ICD-10-CM | POA: Insufficient documentation

## 2021-04-23 DIAGNOSIS — Z79899 Other long term (current) drug therapy: Secondary | ICD-10-CM | POA: Diagnosis not present

## 2021-04-23 DIAGNOSIS — N888 Other specified noninflammatory disorders of cervix uteri: Secondary | ICD-10-CM | POA: Insufficient documentation

## 2021-04-23 DIAGNOSIS — Z90710 Acquired absence of both cervix and uterus: Secondary | ICD-10-CM | POA: Diagnosis present

## 2021-04-23 HISTORY — PX: LAPAROSCOPIC VAGINAL HYSTERECTOMY WITH SALPINGECTOMY: SHX6680

## 2021-04-23 HISTORY — PX: ANTERIOR AND POSTERIOR REPAIR: SHX5121

## 2021-04-23 HISTORY — DX: Presence of spectacles and contact lenses: Z97.3

## 2021-04-23 HISTORY — PX: CYSTOSCOPY: SHX5120

## 2021-04-23 HISTORY — DX: Personal history of other endocrine, nutritional and metabolic disease: Z86.39

## 2021-04-23 LAB — TYPE AND SCREEN
ABO/RH(D): O POS
Antibody Screen: NEGATIVE

## 2021-04-23 LAB — POCT PREGNANCY, URINE: Preg Test, Ur: NEGATIVE

## 2021-04-23 SURGERY — HYSTERECTOMY, VAGINAL, LAPAROSCOPY-ASSISTED, WITH SALPINGECTOMY
Anesthesia: General | Site: Vagina

## 2021-04-23 MED ORDER — ONDANSETRON HCL 4 MG PO TABS: 4.0000 mg | ORAL_TABLET | Freq: Four times a day (QID) | ORAL | Status: DC | PRN

## 2021-04-23 MED ORDER — MAGNESIUM HYDROXIDE 400 MG/5ML PO SUSP
30.0000 mL | Freq: Every day | ORAL | Status: DC | PRN
  Filled 2021-04-23: qty 30

## 2021-04-23 MED ORDER — ONDANSETRON HCL 4 MG/2ML IJ SOLN: 4.0000 mg | Freq: Once | INTRAMUSCULAR | Status: DC | PRN

## 2021-04-23 MED ORDER — POVIDONE-IODINE 10 % EX SWAB: 2.0000 "application " | Freq: Once | CUTANEOUS | Status: DC

## 2021-04-23 MED ORDER — LIDOCAINE HCL (CARDIAC) PF 100 MG/5ML IV SOSY
PREFILLED_SYRINGE | INTRAVENOUS | Status: DC | PRN
  Administered 2021-04-23: 60 mg via INTRAVENOUS

## 2021-04-23 MED ORDER — LIDOCAINE 2% (20 MG/ML) 5 ML SYRINGE
INTRAMUSCULAR | Status: AC
  Filled 2021-04-23: qty 5

## 2021-04-23 MED ORDER — OXYCODONE HCL 5 MG PO TABS
ORAL_TABLET | ORAL | Status: AC
  Filled 2021-04-23: qty 2

## 2021-04-23 MED ORDER — HYDROMORPHONE HCL 1 MG/ML IJ SOLN
0.2000 mg | INTRAMUSCULAR | Status: DC | PRN
  Administered 2021-04-23: 0.5 mg via INTRAVENOUS

## 2021-04-23 MED ORDER — SCOPOLAMINE 1 MG/3DAYS TD PT72
1.0000 | MEDICATED_PATCH | TRANSDERMAL | Status: DC
  Administered 2021-04-23: 1.5 mg via TRANSDERMAL

## 2021-04-23 MED ORDER — LAMOTRIGINE 25 MG PO TABS: 75.0000 mg | ORAL_TABLET | Freq: Every day | ORAL | Status: DC

## 2021-04-23 MED ORDER — FENTANYL CITRATE (PF) 250 MCG/5ML IJ SOLN
INTRAMUSCULAR | Status: AC
  Filled 2021-04-23: qty 5

## 2021-04-23 MED ORDER — ACETAMINOPHEN 500 MG PO TABS
ORAL_TABLET | ORAL | Status: AC
  Filled 2021-04-23: qty 2

## 2021-04-23 MED ORDER — SIMETHICONE 80 MG PO CHEW
80.0000 mg | CHEWABLE_TABLET | Freq: Four times a day (QID) | ORAL | Status: DC | PRN
  Administered 2021-04-23 – 2021-04-24 (×3): 80 mg via ORAL

## 2021-04-23 MED ORDER — LACTATED RINGERS IV SOLN: INTRAVENOUS | Status: DC

## 2021-04-23 MED ORDER — SIMETHICONE 80 MG PO CHEW
CHEWABLE_TABLET | ORAL | Status: AC
  Filled 2021-04-23: qty 1

## 2021-04-23 MED ORDER — SCOPOLAMINE 1 MG/3DAYS TD PT72
MEDICATED_PATCH | TRANSDERMAL | Status: AC
  Filled 2021-04-23: qty 1

## 2021-04-23 MED ORDER — PANTOPRAZOLE SODIUM 40 MG PO TBEC
40.0000 mg | DELAYED_RELEASE_TABLET | Freq: Every day | ORAL | Status: DC
  Administered 2021-04-23: 40 mg via ORAL

## 2021-04-23 MED ORDER — IBUPROFEN 200 MG PO TABS
ORAL_TABLET | ORAL | Status: AC
  Filled 2021-04-23: qty 3

## 2021-04-23 MED ORDER — ACETAMINOPHEN 10 MG/ML IV SOLN
INTRAVENOUS | Status: AC
  Filled 2021-04-23: qty 100

## 2021-04-23 MED ORDER — FENTANYL CITRATE (PF) 100 MCG/2ML IJ SOLN
INTRAMUSCULAR | Status: AC
  Filled 2021-04-23: qty 2

## 2021-04-23 MED ORDER — MIDAZOLAM HCL 2 MG/2ML IJ SOLN
INTRAMUSCULAR | Status: AC
  Filled 2021-04-23: qty 2

## 2021-04-23 MED ORDER — ACETAMINOPHEN 10 MG/ML IV SOLN
1000.0000 mg | Freq: Once | INTRAVENOUS | Status: AC
  Administered 2021-04-23: 1000 mg via INTRAVENOUS

## 2021-04-23 MED ORDER — ONDANSETRON HCL 4 MG/2ML IJ SOLN
4.0000 mg | Freq: Four times a day (QID) | INTRAMUSCULAR | Status: DC | PRN
  Administered 2021-04-23: 4 mg via INTRAVENOUS

## 2021-04-23 MED ORDER — ONDANSETRON HCL 4 MG/2ML IJ SOLN
INTRAMUSCULAR | Status: AC
  Filled 2021-04-23: qty 2

## 2021-04-23 MED ORDER — FENTANYL CITRATE (PF) 100 MCG/2ML IJ SOLN
25.0000 ug | INTRAMUSCULAR | Status: DC | PRN
  Administered 2021-04-23 (×2): 50 ug via INTRAVENOUS

## 2021-04-23 MED ORDER — 0.9 % SODIUM CHLORIDE (POUR BTL) OPTIME
TOPICAL | Status: DC | PRN
  Administered 2021-04-23: 500 mL

## 2021-04-23 MED ORDER — ACETAMINOPHEN 500 MG PO TABS
1000.0000 mg | ORAL_TABLET | Freq: Four times a day (QID) | ORAL | Status: DC
  Administered 2021-04-23 – 2021-04-24 (×3): 1000 mg via ORAL

## 2021-04-23 MED ORDER — SUGAMMADEX SODIUM 200 MG/2ML IV SOLN
INTRAVENOUS | Status: DC | PRN
  Administered 2021-04-23: 200 mg via INTRAVENOUS

## 2021-04-23 MED ORDER — PROPOFOL 10 MG/ML IV BOLUS
INTRAVENOUS | Status: DC | PRN
  Administered 2021-04-23: 160 mg via INTRAVENOUS
  Administered 2021-04-23: 40 mg via INTRAVENOUS

## 2021-04-23 MED ORDER — KETOROLAC TROMETHAMINE 30 MG/ML IJ SOLN
INTRAMUSCULAR | Status: DC | PRN
  Administered 2021-04-23: 30 mg via INTRAVENOUS

## 2021-04-23 MED ORDER — HYDROMORPHONE HCL 1 MG/ML IJ SOLN
INTRAMUSCULAR | Status: AC
  Filled 2021-04-23: qty 1

## 2021-04-23 MED ORDER — OXYCODONE HCL 5 MG/5ML PO SOLN: 5.0000 mg | Freq: Once | ORAL | Status: DC | PRN

## 2021-04-23 MED ORDER — ROCURONIUM BROMIDE 100 MG/10ML IV SOLN
INTRAVENOUS | Status: DC | PRN
  Administered 2021-04-23 (×2): 10 mg via INTRAVENOUS
  Administered 2021-04-23: 70 mg via INTRAVENOUS

## 2021-04-23 MED ORDER — MIDAZOLAM HCL 5 MG/5ML IJ SOLN
INTRAMUSCULAR | Status: DC | PRN
  Administered 2021-04-23: 2 mg via INTRAVENOUS

## 2021-04-23 MED ORDER — IBUPROFEN 200 MG PO TABS
600.0000 mg | ORAL_TABLET | Freq: Four times a day (QID) | ORAL | Status: DC
  Administered 2021-04-23 – 2021-04-24 (×3): 600 mg via ORAL

## 2021-04-23 MED ORDER — LACTATED RINGERS IV BOLUS
500.0000 mL | Freq: Once | INTRAVENOUS | Status: AC
  Administered 2021-04-23: 500 mL via INTRAVENOUS

## 2021-04-23 MED ORDER — PROMETHAZINE HCL 25 MG/ML IJ SOLN
12.5000 mg | Freq: Once | INTRAMUSCULAR | Status: DC
  Filled 2021-04-23 (×2): qty 1

## 2021-04-23 MED ORDER — BUPROPION HCL ER (XL) 150 MG PO TB24: 150.0000 mg | ORAL_TABLET | Freq: Every day | ORAL | Status: DC

## 2021-04-23 MED ORDER — BUPIVACAINE HCL (PF) 0.25 % IJ SOLN
INTRAMUSCULAR | Status: DC | PRN
  Administered 2021-04-23: 10 mL

## 2021-04-23 MED ORDER — FENTANYL CITRATE (PF) 100 MCG/2ML IJ SOLN
INTRAMUSCULAR | Status: DC | PRN
  Administered 2021-04-23 (×3): 25 ug via INTRAVENOUS
  Administered 2021-04-23: 100 ug via INTRAVENOUS
  Administered 2021-04-23: 50 ug via INTRAVENOUS
  Administered 2021-04-23 (×4): 25 ug via INTRAVENOUS
  Administered 2021-04-23 (×2): 50 ug via INTRAVENOUS
  Administered 2021-04-23: 25 ug via INTRAVENOUS

## 2021-04-23 MED ORDER — PROPOFOL 10 MG/ML IV BOLUS
INTRAVENOUS | Status: AC
  Filled 2021-04-23: qty 40

## 2021-04-23 MED ORDER — VASOPRESSIN 20 UNIT/ML IV SOLN
INTRAVENOUS | Status: DC | PRN
  Administered 2021-04-23: 20 mL via INTRAMUSCULAR

## 2021-04-23 MED ORDER — PANTOPRAZOLE SODIUM 40 MG PO TBEC
DELAYED_RELEASE_TABLET | ORAL | Status: AC
  Filled 2021-04-23: qty 1

## 2021-04-23 MED ORDER — DEXAMETHASONE SODIUM PHOSPHATE 10 MG/ML IJ SOLN
INTRAMUSCULAR | Status: AC
  Filled 2021-04-23: qty 1

## 2021-04-23 MED ORDER — DEXAMETHASONE SODIUM PHOSPHATE 4 MG/ML IJ SOLN
INTRAMUSCULAR | Status: DC | PRN
  Administered 2021-04-23: 10 mg via INTRAVENOUS

## 2021-04-23 MED ORDER — ROCURONIUM BROMIDE 10 MG/ML (PF) SYRINGE
PREFILLED_SYRINGE | INTRAVENOUS | Status: AC
  Filled 2021-04-23: qty 10

## 2021-04-23 MED ORDER — DROPERIDOL 2.5 MG/ML IJ SOLN: 0.6250 mg | Freq: Once | INTRAMUSCULAR | Status: DC | PRN

## 2021-04-23 MED ORDER — CEFAZOLIN SODIUM-DEXTROSE 2-4 GM/100ML-% IV SOLN
2.0000 g | INTRAVENOUS | Status: AC
  Administered 2021-04-23: 2 g via INTRAVENOUS

## 2021-04-23 MED ORDER — SODIUM CHLORIDE 0.9 % IV SOLN
12.5000 mg | Freq: Once | INTRAVENOUS | Status: AC
  Administered 2021-04-23: 12.5 mg via INTRAVENOUS
  Filled 2021-04-23: qty 12.5

## 2021-04-23 MED ORDER — ONDANSETRON HCL 4 MG/2ML IJ SOLN
INTRAMUSCULAR | Status: DC | PRN
  Administered 2021-04-23: 4 mg via INTRAVENOUS

## 2021-04-23 MED ORDER — MENTHOL 3 MG MT LOZG: 1.0000 | LOZENGE | OROMUCOSAL | Status: DC | PRN

## 2021-04-23 MED ORDER — SODIUM CHLORIDE 0.9 % IR SOLN
Status: DC | PRN
  Administered 2021-04-23: 1000 mL

## 2021-04-23 MED ORDER — OXYCODONE HCL 5 MG PO TABS
5.0000 mg | ORAL_TABLET | ORAL | Status: DC | PRN
  Administered 2021-04-23 – 2021-04-24 (×5): 10 mg via ORAL

## 2021-04-23 MED ORDER — OXYCODONE HCL 5 MG PO TABS: 5.0000 mg | ORAL_TABLET | Freq: Once | ORAL | Status: DC | PRN

## 2021-04-23 MED ORDER — CEFAZOLIN SODIUM-DEXTROSE 2-4 GM/100ML-% IV SOLN
INTRAVENOUS | Status: AC
  Filled 2021-04-23: qty 100

## 2021-04-23 SURGICAL SUPPLY — 68 items
ADH SKN CLS APL DERMABOND .7 (GAUZE/BANDAGES/DRESSINGS) ×3
APL SRG 38 LTWT LNG FL B (MISCELLANEOUS)
APPLICATOR ARISTA FLEXITIP XL (MISCELLANEOUS) IMPLANT
BLADE SURG 15 STRL LF DISP TIS (BLADE) IMPLANT
BLADE SURG 15 STRL SS (BLADE) ×4
CABLE HIGH FREQUENCY MONO STRZ (ELECTRODE) IMPLANT
COVER BACK TABLE 60X90IN (DRAPES) ×4 IMPLANT
COVER MAYO STAND STRL (DRAPES) ×4 IMPLANT
DECANTER SPIKE VIAL GLASS SM (MISCELLANEOUS) IMPLANT
DERMABOND ADVANCED (GAUZE/BANDAGES/DRESSINGS) ×1
DERMABOND ADVANCED .7 DNX12 (GAUZE/BANDAGES/DRESSINGS) ×3 IMPLANT
DRSG OPSITE POSTOP 3X4 (GAUZE/BANDAGES/DRESSINGS) IMPLANT
DURAPREP 26ML APPLICATOR (WOUND CARE) ×4 IMPLANT
ELECT REM PT RETURN 9FT ADLT (ELECTROSURGICAL)
ELECTRODE REM PT RTRN 9FT ADLT (ELECTROSURGICAL) IMPLANT
GAUZE 4X4 16PLY ~~LOC~~+RFID DBL (SPONGE) ×12 IMPLANT
GAUZE PACKING 2X5 YD STRL (GAUZE/BANDAGES/DRESSINGS) ×4 IMPLANT
GLOVE SURG ENC MOIS LTX SZ6.5 (GLOVE) ×8 IMPLANT
GLOVE SURG LTX SZ6.5 (GLOVE) ×4 IMPLANT
GLOVE SURG POLYISO LF SZ6 (GLOVE) ×2 IMPLANT
GLOVE SURG POLYISO LF SZ7 (GLOVE) ×3 IMPLANT
GLOVE SURG UNDER POLY LF SZ6 (GLOVE) ×2 IMPLANT
GLOVE SURG UNDER POLY LF SZ7 (GLOVE) ×7 IMPLANT
GLOVE SURG UNDER POLY LF SZ7.5 (GLOVE) ×2 IMPLANT
GOWN SRG XL 47XLVL 3 REINF (GOWN DISPOSABLE) IMPLANT
GOWN STRL REIN XL LVL3 (GOWN DISPOSABLE) ×4
GOWN STRL REUS W/TWL LRG LVL3 (GOWN DISPOSABLE) ×21 IMPLANT
HEMOSTAT ARISTA ABSORB 3G PWDR (HEMOSTASIS) IMPLANT
KIT TURNOVER CYSTO (KITS) ×4 IMPLANT
NDL HYPO 25X1 1.5 SAFETY (NEEDLE) IMPLANT
NDL MAYO CATGUT SZ4 TPR NDL (NEEDLE) IMPLANT
NDL SPNL 22GX3.5 QUINCKE BK (NEEDLE) ×3 IMPLANT
NEEDLE HYPO 22GX1.5 SAFETY (NEEDLE) IMPLANT
NEEDLE HYPO 25X1 1.5 SAFETY (NEEDLE) ×4 IMPLANT
NEEDLE INSUFFLATION 120MM (ENDOMECHANICALS) ×4 IMPLANT
NEEDLE MAYO CATGUT SZ4 (NEEDLE) IMPLANT
NEEDLE SPNL 22GX3.5 QUINCKE BK (NEEDLE) ×4 IMPLANT
NS IRRIG 1000ML POUR BTL (IV SOLUTION) ×3 IMPLANT
NS IRRIG 500ML POUR BTL (IV SOLUTION) ×5 IMPLANT
PACK LAVH (CUSTOM PROCEDURE TRAY) ×4 IMPLANT
PACK ROBOTIC GOWN (GOWN DISPOSABLE) ×4 IMPLANT
PACK TRENDGUARD 450 HYBRID PRO (MISCELLANEOUS) IMPLANT
PACK VAGINAL WOMENS (CUSTOM PROCEDURE TRAY) ×4 IMPLANT
PROTECTOR NERVE ULNAR (MISCELLANEOUS) ×8 IMPLANT
SET IRRIG Y TYPE TUR BLADDER L (SET/KITS/TRAYS/PACK) ×4 IMPLANT
SET SUCTION IRRIG HYDROSURG (IRRIGATION / IRRIGATOR) IMPLANT
SET TRI-LUMEN FLTR TB AIRSEAL (TUBING) ×4 IMPLANT
SHEARS 1100 HARMONIC 36 (ELECTROSURGICAL) ×4 IMPLANT
SPONGE T-LAP 4X18 ~~LOC~~+RFID (SPONGE) ×4 IMPLANT
SUT PROLENE 1 CT 1 30 (SUTURE) IMPLANT
SUT VIC AB 0 CT1 18XCR BRD8 (SUTURE) ×6 IMPLANT
SUT VIC AB 0 CT1 27 (SUTURE)
SUT VIC AB 0 CT1 27XBRD ANBCTR (SUTURE) IMPLANT
SUT VIC AB 0 CT1 8-18 (SUTURE) ×8
SUT VIC AB 2-0 CT1 (SUTURE) ×8 IMPLANT
SUT VIC AB 2-0 CT1 27 (SUTURE) ×4
SUT VIC AB 2-0 CT1 TAPERPNT 27 (SUTURE) ×3 IMPLANT
SUT VIC AB 2-0 UR5 27 (SUTURE) IMPLANT
SUT VIC AB 4-0 PS2 18 (SUTURE) ×4 IMPLANT
SUT VICRYL 0 TIES 12 18 (SUTURE) IMPLANT
SYR 50ML LL SCALE MARK (SYRINGE) ×1 IMPLANT
SYR CONTROL 10ML LL (SYRINGE) ×2 IMPLANT
TOWEL OR 17X26 10 PK STRL BLUE (TOWEL DISPOSABLE) ×8 IMPLANT
TRAY FOLEY W/BAG SLVR 14FR LF (SET/KITS/TRAYS/PACK) ×4 IMPLANT
TRENDGUARD 450 HYBRID PRO PACK (MISCELLANEOUS) ×4
TROCAR BLADELESS OPT 5 100 (ENDOMECHANICALS) ×8 IMPLANT
TROCAR PORT AIRSEAL 5X120 (TROCAR) ×4 IMPLANT
WARMER LAPAROSCOPE (MISCELLANEOUS) ×4 IMPLANT

## 2021-04-23 NOTE — Progress Notes (Addendum)
Pt moaning and stating the catheter is burning and the pain is not letting up.  Repositioned pt earlier, medicated w/ Oxy 10mg  and checked on catheter placement but pt is not getting any relief. Assisted pt to take a few steps but pt was dizzy and stated she wanted to go back to bed. Dr. Marvel Plan notified.  Order received to give IVPB Phenergan 12.5mg , remove vag packing and if no relief, may give IV Dilaudid.  Foley to remain in place until 5pm this pm.  Will attempt to ambulate pt again and encourage light lunch.

## 2021-04-23 NOTE — Transfer of Care (Signed)
Immediate Anesthesia Transfer of Care Note  Patient: Rebecca Holt  Procedure(s) Performed: Procedure(s) (LRB): LAPAROSCOPIC ASSISTED VAGINAL HYSTERECTOMY WITH BILATERAL  SALPINGECTOMY (Bilateral) CYSTOSCOPY (N/A) POSTERIOR REPAIR (RECTOCELE) (N/A)  Patient Location: PACU  Anesthesia Type: General  Level of Consciousness: awake, sedated, patient cooperative and responds to stimulation  Airway & Oxygen Therapy: Patient Spontanous Breathing and Patient connected to [place] O2 and soft FM  Post-op Assessment: Report given to PACU RN, Post -op Vital signs reviewed and stable and Patient moving all extremities  Post vital signs: Reviewed and stable  Complications: No apparent anesthesia complications

## 2021-04-23 NOTE — Op Note (Signed)
Operative Note  **Dr. Meisinger's assistance was required to retract and aid visualization to complete the surgery   Preoperative Diagnosis Dysmenorrhea Pelvic Pain Dyspareunia Rectocele  Postoperative Diagnosis Same with adhesions of left fallopian tube to side wall  Procedure Laparoscopic assisted vaginal hysterectomy with bilateral salpingectomies Posterior repair Cystoscopy  Surgeon Paula Compton, MD Cheri Fowler, MD  Anesthesia  Fluids: EBL 2101ml UOP 174mL clear IVF 1067mL LR   Findings The uterus was normal in size.  THe ovaries appeared normal and there was no obvious endometriosis.  There were some filmy adhesions of the left ovary and tube to the side wall which were resected.   Specimen Uterus, cervix, and tubes  Procedure Note  Patient was taken to the operating room where general anesthesia was obtained without difficulty. She was then prepped and draped in the normal sterile fashion in the dorsal lithotomy position. An appropriate timeout was performed. A speculum was then placed within the vagina and a Hulka tenaculum placed within the cervix for uterine manipulation. A foley catheter was placed in the bladder.  Attention was then turned to the patient's abdomen after draping where the infraumbilical area was injected with approximately 10 cc of quarter percent Marcaine. A 1 cm incision was then made within the umbilicus and the varies needle easily introduced into the peritoneal cavity. Intraperitoneal placement was confirmed by aspiration and injection with normal saline. Gas flow was then applied and a pneumoperitoneum obtained with approximate 3 L of CO2 gas. The varies needle was then removed and a 5 mm optiview trocar was easily introduced into the abdomen under direct visualization. . With patient in Trendelenburg the uterus and tubes and ovaries were inspected with findings as previously stated. Two additional trocars were placed in the upper lateral  quadrants under direct visualization after injection with quarter percent marcaine, an airseal on the right and 38mm on the left. .  The Harmonic scalpel was then utilized to dissect the fallopian tubes from the mesosalpinx bilaterally down to the level of the cornua.  The left tube had filmy adhesions to the sidewall which were easily taken down with the harmonic scalpel. The remainder of the uteroovarian ligament and the round ligament were then also taken down with the Harmonic to the level of the bladder flap.  The bladder flap was taken down from the lower uterine segment and pushed away to expose the cervix.  The uterine arteries were transected bilaterally with the harmonic and all pedicles hemostatic.   Marland Kitchen  Attention was then turned to the vagina after all instruments were removed and the trocars covered with a sterile drape. The cervix was grasped with Yates Decamp tenaculums x 2 and injected with a dilute solution of Pitressin circumferentially.  The bovie was then used to make a circumferential incision.  The mayo scissors then further dissected the vaginal mucosa from the underlying cervix and the anterior and posterior cul de sac entered sharply.  With a banana speculum and deaver retractor isolating the uterus from the bladder and rectum.  The uterosacral ligaments and paracervical tissue was taken down sequentially with parametrial clamps and suture ligated with zero vicryl at each step.  When  the was uterus freed on the patient's right, it was then delivered and the remaining tissue on the left clamped and transected completely freeing the uterus and tubes.  It was handed off to pathology.   A small amount of bleeding on the left angle was controlled with a figure of eight suture of zero  vicryl and all was hemostatic. The uterosacral ligaments were approximated with zero vicryl.  The short weighted speculum was placed and the posterior cuff run with a running locked 2-0 vicryl for hemostasis. The  anterior vagina was inspected and no obvious cystocele noted.  The vaginal cuff was then closed with 2-0 vicryl in a running locked suture. Attention was turned posteriorly and the introitus grasped with alys clamps and denuded.  The metzenbaum scissors were the used to underscore the midline vaginal mucosa after injection with dilute pitressin.  The vaginal mucosa flaps were reflected laterally and the rectovaginal fascia trimmed off and push to the midline.  Several 0-vicryl sutures were the placed to reapproximate the fascia.  THe excess mucosa was then trimmed away and closed with 2-0 vicryl in a running fashion.  Vaginal packing was placed. A cystoscopy was performed with no injuries to the bladder noted and both ureteral jets seen.  All instruments were then removed from the vagina.  Gowns and gloves were changed and attention was returned to the abdomen, where pneumoperitoneum was again obtained and all inspected.  The cuff was hemostatic and some slight oozing on the left was controlled with harmonic scalpel.  A four quadrant view of the pelvis and abdomen was performed and found to be normal with no bleeding or injuries noted.  The instruments were removed from the abdomen as well as the 5 mm lateral ports under visualization.  The pneumoperitoneum was reduced through the trocar. The trocar was finally removed and the infraumbilical incision and lateral incisions were closed with a subcuticular stitch of 3-0 Vicryl. Dermabond and a bandage were placed. Patient was then awakened and taken to the recovery room in good condition.

## 2021-04-23 NOTE — Progress Notes (Signed)
Pt up walking in hall w/ this RN.  Tolerated well.  Assisted pt to BR.  Voided only 100c clear yellow urine.  Bladder scan x 3 -  0cc each time.  ABD not noticeable distended.  IV remains at 125cc/hr.  Call put in to Dr. Marvel Plan.

## 2021-04-23 NOTE — Anesthesia Procedure Notes (Signed)
Procedure Name: Intubation Date/Time: 04/23/2021 7:40 AM Performed by: Justice Rocher, CRNA Pre-anesthesia Checklist: Patient identified, Emergency Drugs available, Suction available, Patient being monitored and Timeout performed Patient Re-evaluated:Patient Re-evaluated prior to induction Oxygen Delivery Method: Circle system utilized Preoxygenation: Pre-oxygenation with 100% oxygen Induction Type: IV induction Ventilation: Mask ventilation without difficulty Laryngoscope Size: Mac and 3 Grade View: Grade II Tube type: Oral Tube size: 7.0 mm Number of attempts: 1 Airway Equipment and Method: Stylet and Oral airway Placement Confirmation: ETT inserted through vocal cords under direct vision, positive ETCO2, breath sounds checked- equal and bilateral and CO2 detector Secured at: 21 cm Tube secured with: Tape Dental Injury: Teeth and Oropharynx as per pre-operative assessment

## 2021-04-23 NOTE — Anesthesia Postprocedure Evaluation (Signed)
Anesthesia Post Note  Patient: Gaye Alken  Procedure(s) Performed: LAPAROSCOPIC ASSISTED VAGINAL HYSTERECTOMY WITH BILATERAL  SALPINGECTOMY (Bilateral: Abdomen) CYSTOSCOPY (Bladder) POSTERIOR REPAIR (RECTOCELE) (Vagina )     Patient location during evaluation: PACU Anesthesia Type: General Level of consciousness: awake and alert and oriented Pain management: pain level controlled Vital Signs Assessment: post-procedure vital signs reviewed and stable Respiratory status: spontaneous breathing, nonlabored ventilation and respiratory function stable Cardiovascular status: blood pressure returned to baseline and stable Postop Assessment: no apparent nausea or vomiting Anesthetic complications: no   No notable events documented.  Last Vitals:  Vitals:   04/23/21 1100 04/23/21 1118  BP: 113/72 111/60  Pulse: 70 69  Resp: 12 17  Temp:  37.3 C  SpO2: 96% 97%    Last Pain:  Vitals:   04/23/21 1100  TempSrc:   PainSc: 6                  Chee Dimon A.

## 2021-04-23 NOTE — Progress Notes (Signed)
Vaginal packing removed per order.  Pt states "it's burning and the pain is not going away.".  Waiting on Phen IVPB from pharmacy.  Will give Dilaudid IV.

## 2021-04-23 NOTE — Progress Notes (Signed)
Patient ID: Rebecca Holt, female   DOB: 1986-04-30, 35 y.o.   MRN: [id number] DOD  Patient feeling better after packing removed and cathter out.  Less burning.  Took phenergan and now oxycodone.  Nausea improved  UOP 252mL, not drinking a lot  Afeb VSS Abdomen soft, incisions clear  Will bolus before d/c IV and encourage po intake Ambulate and voiding trial Plan for d//c in AM

## 2021-04-23 NOTE — Interval H&P Note (Signed)
History and Physical Interval Note:  04/23/2021 6:54 AM  Rebecca Holt  has presented today for surgery, with the diagnosis of dysmenorrhea.  The various methods of treatment have been discussed with the patient and family. After consideration of risks, benefits and other options for treatment, the patient has consented to  Procedure(s): LAPAROSCOPIC ASSISTED VAGINAL HYSTERECTOMY WITH SALPINGECTOMY, POSSIBLE OOPHORECTOMIES (Bilateral) CYSTOSCOPY (N/A) ANTERIOR (CYSTOCELE) AND POSTERIOR REPAIR (RECTOCELE) (N/A) as a surgical intervention.  The patient's history has been reviewed, patient examined, no change in status, stable for surgery.  I have reviewed the patient's chart and labs.  Questions were answered to the patient's satisfaction.     [name]

## 2021-04-24 ENCOUNTER — Encounter (HOSPITAL_BASED_OUTPATIENT_CLINIC_OR_DEPARTMENT_OTHER): Payer: Self-pay | Admitting: Obstetrics and Gynecology

## 2021-04-24 DIAGNOSIS — N946 Dysmenorrhea, unspecified: Secondary | ICD-10-CM | POA: Diagnosis not present

## 2021-04-24 LAB — CBC
HCT: 27.2 % — ABNORMAL LOW (ref 36.0–46.0)
Hemoglobin: 8.7 g/dL — ABNORMAL LOW (ref 12.0–15.0)
MCH: 29 pg (ref 26.0–34.0)
MCHC: 32 g/dL (ref 30.0–36.0)
MCV: 90.7 fL (ref 80.0–100.0)
Platelets: 215 10*3/uL (ref 150–400)
RBC: 3 MIL/uL — ABNORMAL LOW (ref 3.87–5.11)
RDW: 12.8 % (ref 11.5–15.5)
WBC: 10 10*3/uL (ref 4.0–10.5)
nRBC: 0 % (ref 0.0–0.2)

## 2021-04-24 LAB — BASIC METABOLIC PANEL
Anion gap: 6 (ref 5–15)
BUN: 9 mg/dL (ref 6–20)
CO2: 24 mmol/L (ref 22–32)
Calcium: 8.4 mg/dL — ABNORMAL LOW (ref 8.9–10.3)
Chloride: 104 mmol/L (ref 98–111)
Creatinine, Ser: 0.61 mg/dL (ref 0.44–1.00)
GFR, Estimated: >60 mL/min (ref >60)
Glucose, Bld: 124 mg/dL — ABNORMAL HIGH (ref 70–99)
Potassium: 3.8 mmol/L (ref 3.5–5.1)
Sodium: 134 mmol/L — ABNORMAL LOW (ref 135–145)

## 2021-04-24 LAB — SURGICAL PATHOLOGY

## 2021-04-24 MED ORDER — ACETAMINOPHEN 500 MG PO TABS
ORAL_TABLET | ORAL | Status: AC
  Filled 2021-04-24: qty 2

## 2021-04-24 MED ORDER — OXYCODONE HCL 5 MG PO TABS: 5.0000 mg | ORAL_TABLET | ORAL | 0 refills | Status: DC | PRN

## 2021-04-24 MED ORDER — ACETAMINOPHEN 500 MG PO TABS
1000.0000 mg | ORAL_TABLET | Freq: Four times a day (QID) | ORAL | 0 refills | Status: DC
Start: 2021-04-24 — End: 2021-07-19

## 2021-04-24 MED ORDER — SIMETHICONE 80 MG PO CHEW
CHEWABLE_TABLET | ORAL | Status: AC
  Filled 2021-04-24: qty 1

## 2021-04-24 MED ORDER — OXYCODONE HCL 5 MG PO TABS
ORAL_TABLET | ORAL | Status: AC
  Filled 2021-04-24: qty 2

## 2021-04-24 MED ORDER — IBUPROFEN 200 MG PO TABS
ORAL_TABLET | ORAL | Status: AC
  Filled 2021-04-24: qty 3

## 2021-04-24 MED ORDER — IBUPROFEN 600 MG PO TABS: 600.0000 mg | ORAL_TABLET | Freq: Four times a day (QID) | ORAL | 0 refills | Status: DC

## 2021-04-24 NOTE — Progress Notes (Signed)
1 Day Post-Op Procedure(s) (LRB): LAPAROSCOPIC ASSISTED VAGINAL HYSTERECTOMY WITH BILATERAL  SALPINGECTOMY (Bilateral) CYSTOSCOPY (N/A) POSTERIOR REPAIR (RECTOCELE) (N/A)  Subjective: Patient reports tolerating PO and no problems voiding.  Ambulating well with no dizziness.  Pain controlled  Objective: I have reviewed patient's vital signs, intake and output, and labs.  General: alert and cooperative GI: incision: clean and dry and abdomen soft NT Vaginal Bleeding: minimal  Assessment: s/p Procedure(s): LAPAROSCOPIC ASSISTED VAGINAL HYSTERECTOMY WITH BILATERAL  SALPINGECTOMY (Bilateral) CYSTOSCOPY (N/A) POSTERIOR REPAIR (RECTOCELE) (N/A): stable  Plan: Discharge home Instructions reviewed  LOS: 0 days    Rebecca Holt 04/24/2021, 9:13 AM

## 2021-05-16 ENCOUNTER — Other Ambulatory Visit: Payer: Self-pay | Admitting: Gastroenterology

## 2021-05-16 DIAGNOSIS — R634 Abnormal weight loss: Secondary | ICD-10-CM

## 2021-05-18 ENCOUNTER — Telehealth (INDEPENDENT_AMBULATORY_CARE_PROVIDER_SITE_OTHER): Payer: 59 | Admitting: Physician Assistant

## 2021-05-18 DIAGNOSIS — G43009 Migraine without aura, not intractable, without status migrainosus: Secondary | ICD-10-CM | POA: Diagnosis not present

## 2021-05-18 MED ORDER — METHYLPREDNISOLONE 4 MG PO TBPK: ORAL_TABLET | ORAL | 0 refills | Status: DC

## 2021-05-18 NOTE — Progress Notes (Signed)
Virtual Visit via Video Note  I connected with  Rebecca Holt  on 05/18/21 at 11:45 AM EST by a video enabled telemedicine application and verified that I am speaking with the correct person using two identifiers.  Location: Patient: home Provider: Therapist, music at [place] present: Patient and myself   I discussed the limitations of evaluation and management by telemedicine and the availability of in person appointments. The patient expressed understanding and agreed to proceed.   History of Present Illness:  HA x 5 days. She has been getting headaches about once per month. Hysterectomy on 04/23/21 Ibuprofen, Tylenol, Aleve, no relief.  Also tried a triptan without relief.  Right side of head going behind the ear and down the neck. Constant. Sensitive to light. No N/V. Neck feels tense but not stiff. Feels like typical headache, but this has lasted longer than normal for her. 4/10 pain currently.  No numbness or tingling. No changes in her vision. No weakness. No recent sickness. No other symptoms. BP has been normal per patient.    Observations/Objective:   Gen: Awake, alert, no acute distress Resp: Breathing is even and non-labored Psych: calm/pleasant demeanor Neuro: Alert and Oriented x 3, + facial symmetry, speech is clear. No focal neuro deficit present.    Assessment and Plan:  1. Migraine without aura and without status migrainosus, not intractable -No red flags, typical migraine for her aside from lengthened duration. -Could be having some rebound pain by this point with the Tylenol and NSAIDs she has been taking this week. Will try Medrol Dose pak for relief at this time. Also suggested Benadryl prn. She declined anti-emetics at this time. -Drink plenty of fluids, limit screen time. Rest!  -ED should any red flags arise. Pt to message with update next week.    Follow Up Instructions:    I discussed the assessment and treatment plan with  the patient. The patient was provided an opportunity to ask questions and all were answered. The patient agreed with the plan and demonstrated an understanding of the instructions.   The patient was advised to call back or seek an in-person evaluation if the symptoms worsen or if the condition fails to improve as anticipated.  Brylei Pedley M Shanitra Phillippi, PA-C

## 2021-05-30 ENCOUNTER — Other Ambulatory Visit: Payer: Self-pay | Admitting: Psychiatry

## 2021-05-30 DIAGNOSIS — F39 Unspecified mood [affective] disorder: Secondary | ICD-10-CM

## 2021-06-06 ENCOUNTER — Ambulatory Visit
Admission: RE | Admit: 2021-06-06 | Discharge: 2021-06-06 | Disposition: A | Discharge status: Home / Self Care | Payer: 59 | Source: Ambulatory Visit | Attending: Gastroenterology | Admitting: Gastroenterology

## 2021-06-06 ENCOUNTER — Other Ambulatory Visit: Payer: Self-pay

## 2021-06-06 DIAGNOSIS — R634 Abnormal weight loss: Secondary | ICD-10-CM

## 2021-06-06 MED ORDER — IOPAMIDOL (ISOVUE-300) INJECTION 61%
100.0000 mL | Freq: Once | INTRAVENOUS | Status: AC | PRN
  Administered 2021-06-06: 100 mL via INTRAVENOUS

## 2021-06-07 ENCOUNTER — Encounter: Payer: Self-pay | Admitting: Family

## 2021-06-07 ENCOUNTER — Ambulatory Visit (INDEPENDENT_AMBULATORY_CARE_PROVIDER_SITE_OTHER): Payer: 59 | Admitting: Family

## 2021-06-07 VITALS — BP 122/82 | HR 85 | Temp 97.9°F | Ht 67.0 in | Wt 148.6 lb

## 2021-06-07 DIAGNOSIS — B379 Candidiasis, unspecified: Secondary | ICD-10-CM | POA: Diagnosis not present

## 2021-06-07 DIAGNOSIS — R053 Chronic cough: Secondary | ICD-10-CM | POA: Insufficient documentation

## 2021-06-07 DIAGNOSIS — T3695XA Adverse effect of unspecified systemic antibiotic, initial encounter: Secondary | ICD-10-CM | POA: Diagnosis not present

## 2021-06-07 MED ORDER — AZITHROMYCIN 250 MG PO TABS: ORAL_TABLET | ORAL | 0 refills | Status: DC

## 2021-06-07 MED ORDER — HYDROCODONE BIT-HOMATROP MBR 5-1.5 MG/5ML PO SOLN
5.0000 mL | Freq: Four times a day (QID) | ORAL | 0 refills | Status: DC | PRN
Start: 2021-06-07 — End: 2021-07-19

## 2021-06-07 MED ORDER — FLUCONAZOLE 150 MG PO TABS: 150.0000 mg | ORAL_TABLET | ORAL | 0 refills | Status: DC | PRN

## 2021-06-07 MED ORDER — AZITHROMYCIN 250 MG PO TABS: ORAL_TABLET | ORAL | 0 refills | Status: AC

## 2021-06-07 NOTE — Assessment & Plan Note (Signed)
Pt very hoarse from coughing, states her mucus is now green, seems to be worsening instead of improving. Recommended steroids, but pt feels her immune system is so low already, recently dx with an autoimmune disorder, that she wants to avoid these for now, sending Zpack, advised on use & SE and to increase fluid intake, ok to take Ibuprofen or Aleve prn for aches/pain/fever.

## 2021-06-07 NOTE — Progress Notes (Signed)
Subjective:     Patient ID: Rebecca Holt, female    DOB: 1986/01/14, 35 y.o.   MRN: [id number]  Chief Complaint  Patient presents with   Cough    Recently after having FLU   Nasal Congestion    HPI: Upper Respiratory Infection: Symptoms include productive cough with  green colored sputum, hoarseness.  Onset of symptoms was 8 days ago, unchanged since that time. She is drinking moderate amounts of fluids. Evaluation to date: none.  Treatment to date: antihistamines, cough suppressants, and decongestants.    Health Maintenance Due  Topic Date Due   Hepatitis C Screening  Never done    Past Medical History:  Diagnosis Date   Anxiety    Arthritis    si joints   Bipolar disorder ([place])    Chicken pox    as child   Depressive disorder 02/08/2019   History of COVID-19 09/09/2020   g i issues x 2-3 weeks all symptoms reolved   History of hypothyroidism    yrs ago per pt on 04-17-2021   Pneumonia    yrs ago per pt on 04-17-2021   Wears glasses     Past Surgical History:  Procedure Laterality Date   ANTERIOR AND POSTERIOR REPAIR N/A 04/23/2021   Procedure: POSTERIOR REPAIR (RECTOCELE);  Surgeon: Paula Compton, MD;  Location: [facility];  Service: Gynecology;  Laterality: N/A;   CYSTOSCOPY N/A 04/23/2021   Procedure: CYSTOSCOPY;  Surgeon: Paula Compton, MD;  Location: [facility];  Service: Gynecology;  Laterality: N/A;   LAPAROSCOPIC VAGINAL HYSTERECTOMY WITH SALPINGECTOMY Bilateral 04/23/2021   Procedure: LAPAROSCOPIC ASSISTED VAGINAL HYSTERECTOMY WITH BILATERAL  SALPINGECTOMY;  Surgeon: Paula Compton, MD;  Location: [facility];  Service: Gynecology;  Laterality: Bilateral;   TONSILLECTOMY Bilateral 2020   TRANSFORAMINAL LUMBAR INTERBODY FUSION (TLIF) WITH PEDICLE SCREW FIXATION 1 LEVEL N/A 05/07/2019   Procedure: Lumbar Five- Sacral One Transforaminal lumbar interbody Fusion;  Surgeon: Erline Levine, MD;  Location:  [facility];  Service: Neurosurgery;  Laterality: N/A;  Lumbar 5 Sacral 1 Transforaminal lumbar interbody fusion    Outpatient Medications Prior to Visit  Medication Sig Dispense Refill   ALPRAZolam (XANAX) 0.5 MG tablet TAKE ONE TABLET BY MOUTH TWO TIMES A DAY AS NEEDED FOR ANXIETY 45 tablet 5   buPROPion (WELLBUTRIN XL) 150 MG 24 hr tablet Take 1 tablet (150 mg total) by mouth daily. 90 tablet 2   cholecalciferol (VITAMIN D3) 25 MCG (1000 UNIT) tablet Take 1,000 Units by mouth daily.     lamoTRIgine (LAMICTAL) 25 MG tablet TAKE THREE TABLETS BY MOUTH DAILY 90 tablet 2   LYSINE PO Take by mouth. 3 tabs daily     modafinil (PROVIGIL) 200 MG tablet TAKE 1/2 TO 1 TABLET BY MOUTH DAILY 30 tablet 4   Pancrelipase, Lip-Prot-Amyl, (ZENPEP PO) Take by mouth. 2 before meals and 1 before snacks     Pyridoxine HCl (VITAMIN B6 PO) Take by mouth.     acetaminophen (TYLENOL) 500 MG tablet Take 2 tablets (1,000 mg total) by mouth every 6 (six) hours. 30 tablet 0   methylPREDNISolone (MEDROL DOSEPAK) 4 MG TBPK tablet Please take per packaging instructions. 21 tablet 0   Ferrous Sulfate (IRON PO) Take by mouth. (Patient not taking: Reported on 04/17/2021)     ibuprofen (ADVIL) 600 MG tablet Take 1 tablet (600 mg total) by mouth every 6 (six) hours. 30 tablet 0   oxyCODONE (OXY IR/ROXICODONE) 5 MG immediate release tablet Take  1-2 tablets (5-10 mg total) by mouth every 4 (four) hours as needed for moderate pain. 20 tablet 0   No facility-administered medications prior to visit.    Allergies  Allergen Reactions   Tramadol Itching   Latex Other (See Comments)    Causes irritation.        Objective:    Physical Exam Vitals and nursing note reviewed.  Constitutional:      Appearance: Normal appearance.  HENT:     Right Ear: Tympanic membrane and ear canal normal.     Left Ear: Tympanic membrane and ear canal normal.     Mouth/Throat:     Mouth: Mucous membranes are moist.     Pharynx: Oropharyngeal  exudate present. No pharyngeal swelling or posterior oropharyngeal erythema.     Tonsils: No tonsillar exudate or tonsillar abscesses.  Cardiovascular:     Rate and Rhythm: Normal rate and regular rhythm.  Pulmonary:     Effort: Pulmonary effort is normal.     Breath sounds: Normal breath sounds.  Musculoskeletal:        General: Normal range of motion.  Skin:    General: Skin is warm and dry.  Neurological:     Mental Status: She is alert.  Psychiatric:        Mood and Affect: Mood normal.        Behavior: Behavior normal.    BP 122/82   Pulse 85   Temp 97.9 F (36.6 C)   Ht 5\' 7"  (1.702 m)   Wt 148 lb 9.6 oz (67.4 kg)   LMP 05/09/2021 (Approximate)   SpO2 100%   BMI 23.27 kg/m  Wt Readings from Last 3 Encounters:  06/07/21 148 lb 9.6 oz (67.4 kg)  04/23/21 154 lb 9.6 oz (70.1 kg)  04/19/21 152 lb (68.9 kg)       Assessment & Plan:   Problem List Items Addressed This Visit       Other   Persistent cough - Primary    Pt very hoarse from coughing, states her mucus is now green, seems to be worsening instead of improving. Recommended steroids, but pt feels her immune system is so low already, recently dx with an autoimmune disorder, that she wants to avoid these for now, sending Zpack, advised on use & SE and to increase fluid intake, ok to take Ibuprofen or Aleve prn for aches/pain/fever.      Relevant Medications   HYDROcodone bit-homatropine (HYCODAN) 5-1.5 MG/5ML syrup   azithromycin (ZITHROMAX) 250 MG tablet   Antibiotic-induced yeast infection   Relevant Medications   fluconazole (DIFLUCAN) 150 MG tablet   azithromycin (ZITHROMAX) 250 MG tablet    Meds ordered this encounter  Medications   DISCONTD: azithromycin (ZITHROMAX) 250 MG tablet    Sig: Take 2 tablets on day 1, then 1 tablet daily on days 2 through 5    Dispense:  6 tablet    Refill:  0    Order Specific Question:   Supervising Provider    Answer:   ANDY, CAMILLE L [[id number]]   fluconazole  (DIFLUCAN) 150 MG tablet    Sig: Take 1 tablet (150 mg total) by mouth every three (3) days as needed.    Dispense:  2 tablet    Refill:  0    Order Specific Question:   Supervising Provider    Answer:   ANDY, CAMILLE L [[id number]]   HYDROcodone bit-homatropine (HYCODAN) 5-1.5 MG/5ML syrup    Sig: Take 5 mLs by  mouth every 6 (six) hours as needed for cough. Do NOT take with Xanax    Dispense:  120 mL    Refill:  0    Order Specific Question:   Supervising Provider    Answer:   ANDY, CAMILLE L [[id number]]   DISCONTD: azithromycin (ZITHROMAX) 250 MG tablet    Sig: Take 2 tablets on day 1, then 1 tablet daily on days 2 through 5    Dispense:  6 tablet    Refill:  0    Order Specific Question:   Supervising Provider    Answer:   ANDY, CAMILLE L [[id number]]   azithromycin (ZITHROMAX) 250 MG tablet    Sig: Take 2 tablets on day 1, then 1 tablet daily on days 2 through 5    Dispense:  6 tablet    Refill:  0    Discontinued by mistake, pt should receive only 1 Zpack, thanks!    Order Specific Question:   Supervising Provider    Answer:   ANDY, CAMILLE L [[id number]]

## 2021-06-07 NOTE — Patient Instructions (Signed)
It was very nice to see you today!  As discussed I have sent an antibiotic and cough suppressant to your pharmacy, start these today. Continue OTC sinus/cold medications including generic Sudafed, Claritin D, or Mucinex per box directions, Ibuprofen up to 600mg  or generic Tylenol 1,000mg  every 6 hours. OK to take Zinc up to 25mg  & up to 2,000mg  Vitamin C daily while having symptoms, then reduce doses to 3-4 days per week, or 1/2 dose daily to boost immunity.  Increase water intake to at least 2 liters daily.      PLEASE NOTE:  If you had any lab tests please let us know if you have not heard back within a few days. You may see your results on MyChart before we have a chance to review them but we will give you a call once they are reviewed by [place]. If we ordered any referrals today, please let us know if you have not heard from their office within the next week.   Please try these tips to maintain a healthy lifestyle:  Eat most of your calories during the day when you are active. Eliminate processed foods including packaged sweets (pies, cakes, cookies), reduce intake of potatoes, white bread, white pasta, and white rice. Look for whole grain options, oat flour or almond flour.  Each meal should contain half fruits/vegetables, one quarter protein, and one quarter carbs (no bigger than a computer mouse).  Cut down on sweet beverages. This includes juice, soda, and sweet tea. Also watch fruit intake, though this is a healthier sweet option, it still contains natural sugar! Limit to 3 servings daily.  Drink at least 1 glass of water with each meal and aim for at least 8 glasses per day  Exercise at least 150 minutes every week.

## 2021-07-12 ENCOUNTER — Other Ambulatory Visit: Payer: Self-pay | Admitting: *Deleted

## 2021-07-19 ENCOUNTER — Ambulatory Visit: Payer: 59 | Admitting: Physician Assistant

## 2021-07-19 ENCOUNTER — Other Ambulatory Visit: Payer: Self-pay

## 2021-07-19 ENCOUNTER — Encounter: Payer: Self-pay | Admitting: Family Medicine

## 2021-07-19 ENCOUNTER — Ambulatory Visit: Payer: 59 | Admitting: Family Medicine

## 2021-07-19 VITALS — BP 120/60 | HR 68 | Temp 98.2°F | Ht 67.0 in | Wt 142.5 lb

## 2021-07-19 DIAGNOSIS — G4486 Cervicogenic headache: Secondary | ICD-10-CM | POA: Diagnosis not present

## 2021-07-19 DIAGNOSIS — D509 Iron deficiency anemia, unspecified: Secondary | ICD-10-CM

## 2021-07-19 LAB — COMPREHENSIVE METABOLIC PANEL
ALT: 16 U/L (ref 0–35)
AST: 17 U/L (ref 0–37)
Albumin: 4.6 g/dL (ref 3.5–5.2)
Alkaline Phosphatase: 53 U/L (ref 39–117)
BUN: 8 mg/dL (ref 6–23)
CO2: 28 mEq/L (ref 19–32)
Calcium: 9.3 mg/dL (ref 8.4–10.5)
Chloride: 104 mEq/L (ref 96–112)
Creatinine, Ser: 0.75 mg/dL (ref 0.40–1.20)
GFR: 101.8 mL/min (ref >60.00)
Glucose, Bld: 83 mg/dL (ref 70–99)
Potassium: 3.9 mEq/L (ref 3.5–5.1)
Sodium: 140 mEq/L (ref 135–145)
Total Bilirubin: 0.4 mg/dL (ref 0.2–1.2)
Total Protein: 6.9 g/dL (ref 6.0–8.3)

## 2021-07-19 LAB — IBC + FERRITIN
Ferritin: 7 ng/mL — ABNORMAL LOW (ref 10.0–291.0)
Iron: 19 ug/dL — ABNORMAL LOW (ref 42–145)
Saturation Ratios: 5.3 % — ABNORMAL LOW (ref 20.0–50.0)
TIBC: 361.2 ug/dL (ref 250.0–450.0)
Transferrin: 258 mg/dL (ref 212.0–360.0)

## 2021-07-19 LAB — CBC
HCT: 33.2 % — ABNORMAL LOW (ref 36.0–46.0)
Hemoglobin: 10.8 g/dL — ABNORMAL LOW (ref 12.0–15.0)
MCHC: 32.4 g/dL (ref 30.0–36.0)
MCV: 86.3 fl (ref 78.0–100.0)
Platelets: 245 10*3/uL (ref 150.0–400.0)
RBC: 3.85 Mil/uL — ABNORMAL LOW (ref 3.87–5.11)
RDW: 13.8 % (ref 11.5–15.5)
WBC: 4.6 10*3/uL (ref 4.0–10.5)

## 2021-07-19 LAB — VITAMIN B12: Vitamin B-12: 644 pg/mL (ref 211–911)

## 2021-07-19 LAB — SEDIMENTATION RATE: Sed Rate: 1 mm/hr (ref 0–20)

## 2021-07-19 MED ORDER — NORTRIPTYLINE HCL 10 MG PO CAPS: 10.0000 mg | ORAL_CAPSULE | Freq: Every day | ORAL | 1 refills | Status: DC

## 2021-07-19 NOTE — Progress Notes (Signed)
Subjective:     Patient ID: Rebecca Holt, female    DOB: January 01, 1986, 36 y.o.   MRN: [id number]  Chief Complaint  Patient presents with   Headache    Consistent headaches for over 1 month, has been taking tylenol, ibuprofen, aleve and prescription medications with no relief Start or get worse after eating    Low iron    Would like iron levels checked because they were low last time     HPI  HA-long time, but now daily-worse after eats. Has EPI so changing diet. Pain behind eyes. Intermitt nausea.  Throbbing and neck/shoulders.occ photo/phono.  Eyes hurt.  Just feels "bad". No dbl vision/f/c.  No neuro signs.  Takes tylenol/aleve/ibuproven.  Triptans not helping.  Steroids not help.  Anemia-wiped out/tired. Hyst in Oct and Hgb dropped to 8.  If takes iron, vomits GI issues Dr. Acie Fredrickson) EPI-getting egd/colon.  Started w/covid in March - lost 50#  Health Maintenance Due  Topic Date Due   Hepatitis C Screening  Never done   COVID-19 Vaccine (2 - Booster for [employer] series) 03/14/2020    Past Medical History:  Diagnosis Date   Anxiety    Arthritis    si joints   Bipolar disorder ([place])    Chicken pox    as child   Depressive disorder 02/08/2019   History of COVID-19 09/09/2020   g i issues x 2-3 weeks all symptoms reolved   History of hypothyroidism    yrs ago per pt on 04-17-2021   Pneumonia    yrs ago per pt on 04-17-2021   Wears glasses     Past Surgical History:  Procedure Laterality Date   ANTERIOR AND POSTERIOR REPAIR N/A 04/23/2021   Procedure: POSTERIOR REPAIR (RECTOCELE);  Surgeon: Paula Compton, MD;  Location: [facility];  Service: Gynecology;  Laterality: N/A;   CYSTOSCOPY N/A 04/23/2021   Procedure: CYSTOSCOPY;  Surgeon: Paula Compton, MD;  Location: [facility];  Service: Gynecology;  Laterality: N/A;   LAPAROSCOPIC VAGINAL HYSTERECTOMY WITH SALPINGECTOMY Bilateral 04/23/2021   Procedure: LAPAROSCOPIC ASSISTED  VAGINAL HYSTERECTOMY WITH BILATERAL  SALPINGECTOMY;  Surgeon: Paula Compton, MD;  Location: [facility];  Service: Gynecology;  Laterality: Bilateral;   TONSILLECTOMY Bilateral 2020   TRANSFORAMINAL LUMBAR INTERBODY FUSION (TLIF) WITH PEDICLE SCREW FIXATION 1 LEVEL N/A 05/07/2019   Procedure: Lumbar Five- Sacral One Transforaminal lumbar interbody Fusion;  Surgeon: Erline Levine, MD;  Location: [facility];  Service: Neurosurgery;  Laterality: N/A;  Lumbar 5 Sacral 1 Transforaminal lumbar interbody fusion    Outpatient Medications Prior to Visit  Medication Sig Dispense Refill   ALPRAZolam (XANAX) 0.5 MG tablet TAKE ONE TABLET BY MOUTH TWO TIMES A DAY AS NEEDED FOR ANXIETY 45 tablet 5   buPROPion (WELLBUTRIN XL) 150 MG 24 hr tablet Take 1 tablet (150 mg total) by mouth daily. 90 tablet 2   cholecalciferol (VITAMIN D3) 25 MCG (1000 UNIT) tablet Take 1,000 Units by mouth daily.     lamoTRIgine (LAMICTAL) 25 MG tablet TAKE THREE TABLETS BY MOUTH DAILY 90 tablet 2   LYSINE PO Take by mouth. 3 tabs daily     modafinil (PROVIGIL) 200 MG tablet Take by mouth.     ondansetron (ZOFRAN-ODT) 4 MG disintegrating tablet 1 tablet on the tongue and allow to dissolve     Pyridoxine HCl (VITAMIN B6 PO) Take by mouth.     ZENPEP 40000-126000 units CPEP Take by mouth.     acetaminophen (TYLENOL) 500  MG tablet Take 2 tablets (1,000 mg total) by mouth every 6 (six) hours. 30 tablet 0   fluconazole (DIFLUCAN) 150 MG tablet Take 1 tablet (150 mg total) by mouth every three (3) days as needed. 2 tablet 0   HYDROcodone bit-homatropine (HYCODAN) 5-1.5 MG/5ML syrup Take 5 mLs by mouth every 6 (six) hours as needed for cough. Do NOT take with Xanax 120 mL 0   methylPREDNISolone (MEDROL DOSEPAK) 4 MG TBPK tablet Please take per packaging instructions. 21 tablet 0   modafinil (PROVIGIL) 200 MG tablet TAKE 1/2 TO 1 TABLET BY MOUTH DAILY 30 tablet 4   Pancrelipase, Lip-Prot-Amyl, (ZENPEP PO) Take by mouth. 2  before meals and 1 before snacks     No facility-administered medications prior to visit.    Allergies  Allergen Reactions   Tramadol Itching   Latex Other (See Comments)    Causes irritation.   VOJ:[id number] except as noted in HPI  No SI      Objective:     BP 120/60    Pulse 68    Temp 98.2 F (36.8 C) (Temporal)    Ht 5\' 7"  (1.702 m)    Wt 142 lb 8 oz (64.6 kg)    LMP 05/09/2021 (Approximate)    SpO2 95%    BMI 22.32 kg/m  Wt Readings from Last 3 Encounters:  07/19/21 142 lb 8 oz (64.6 kg)  06/07/21 148 lb 9.6 oz (67.4 kg)  04/23/21 154 lb 9.6 oz (70.1 kg)        Gen: WDWN NAD WF HEENT: NCAT, conjunctiva not injected, sclera nonicteric. EOMI TM WNL B, OP moist, no exudates  NECK:  supple, no thyromegaly, no nodes, no carotid bruits CARDIAC: RRR, S1S2+, no murmur. DP 2+B LUNGS: CTAB. No wheezes ABDOMEN:  BS+, soft, diffusely tender mild, No HSM, no masses EXT:  no edema MSK: no gross abnormalities. MS 5/5 all 4.  F-n-ram/pronator drift all neg. NEURO: A&O x3.  CN II-XII intact.  PSYCH: normal mood. Good eye contact  Assessment & Plan:   Problem List Items Addressed This Visit   None Visit Diagnoses     Cervicogenic headache    -  Primary   Relevant Orders   Sedimentation rate   Iron deficiency anemia, unspecified iron deficiency anemia type       Relevant Orders   CBC   Comprehensive metabolic panel   Vitamin O17   IBC + Ferritin   Sedimentation rate      HA-? If from low iron, ?transformed migraine.  Mult meds not working.  Check labs.  Declined Mg.  Will try nortriptylline.  Declines PT/chiro. Anemia-intol mult forms iron.  Check labs.  Getting EGD/colon 2/6.  ?need for iron infusion  No orders of the defined types were placed in this encounter.   Wellington Hampshire, MD

## 2021-07-19 NOTE — Patient Instructions (Signed)
It was very nice to see you today!  Try magnesium daily. Will try nortriptyline   PLEASE NOTE:  If you had any lab tests please let us know if you have not heard back within a few days. You may see your results on MyChart before we have a chance to review them but we will give you a call once they are reviewed by [place]. If we ordered any referrals today, please let us know if you have not heard from their office within the next week.   Please try these tips to maintain a healthy lifestyle:  Eat most of your calories during the day when you are active. Eliminate processed foods including packaged sweets (pies, cakes, cookies), reduce intake of potatoes, white bread, white pasta, and white rice. Look for whole grain options, oat flour or almond flour.  Each meal should contain half fruits/vegetables, one quarter protein, and one quarter carbs (no bigger than a computer mouse).  Cut down on sweet beverages. This includes juice, soda, and sweet tea. Also watch fruit intake, though this is a healthier sweet option, it still contains natural sugar! Limit to 3 servings daily.  Drink at least 1 glass of water with each meal and aim for at least 8 glasses per day  Exercise at least 150 minutes every week.

## 2021-07-20 ENCOUNTER — Telehealth: Payer: Self-pay

## 2021-07-20 ENCOUNTER — Other Ambulatory Visit: Payer: Self-pay | Admitting: Gastroenterology

## 2021-07-20 NOTE — Telephone Encounter (Signed)
Spoke to patient. Went over lab results and recommendations. Patient requested to speak more in depth with provider concerning results.

## 2021-07-20 NOTE — Telephone Encounter (Signed)
Patient called back and would like clarity on her lab results.

## 2021-07-20 NOTE — Telephone Encounter (Signed)
Patient called and stated she saw her lab results and was concerned, let patient know she receives results at the same time as the doctor. Patient would like to see if she can be called before the weekend so she doesn't have to worry over the weekend.

## 2021-07-24 NOTE — H&P (Signed)
HPI: Outpatient labs from 07/19/2021 showed iron deficiency anemia Hemoglobin 10.8 Iron saturation 5.3% Ferritin 7 with TIBC 361 Normal vitamin B12, normal CMP CT from 12/22 normal Ultrasound from 8/22 normal  Current Medications  Taking  Famotidine 20 MG Tablet 1 tablet Orally twice a day lamoTRIgine 25 MG Tablet 1 tablet Orally Once a day Modafinil 200 MG Tablet 1 tablet in the morning Orally Once a day Wellbutrin XL(buPROPion HCl ER (XL)) 300 MG Tablet Extended Release 24 Hour 1 tablet in the morning Orally Once a day Xanax(ALPRAZolam) 0.5 MG Tablet 1 tablet Orally three times a day Dicyclomine HCl 20 MG Tablet 1 tablet Orally three times a day as needed Zenpep(Pancrelipase (Lip-Prot-Amyl)) 40000-126000 UNIT Capsule Delayed Release Particles 2 Capsules Orally 2 Capsules with a meal and 1 with a snack    Past Medical History       Hypothyroidism.      Depressive disorder.      Bipolar.      Irregular menstrual peroids.      Dyspareunia( left sided).      Cystocele stage 1.      Rectocele stage 2.      Dysmenorrhea.      COVID 02/22.    Surgical History        wisdom teeth extraction         back fusion 05/07/2019        removal of tonsils 12/30/2018        hysterectomy with salpingectomy (upcoming 03/2021)       Family History  Mother: hypothyroidism  Paternal [place] Father: magligant tumor of colon, diagnosed with Colon cancer  Maternal Grand Father: leukemia (morphologic abnormality)  Father: diagnosed with Diabetes, Hypertension  no family hx of polyps or liver disease.      Social History  General:   Tobacco use       cigarettes:  Never smoked     Tobacco history last updated  05/16/2021     Vaping  No no Alcohol.  Caffeine: yes, heavy.  no Recreational drug use.  Marital Status: married, [place].  Children: 3 kidsn ; 56 y/o son;.      Allergies  Latex: Allergy  Tramadol: itching - Allergy    Hospitalization/Major Diagnostic Procedure  none in the  past yr 03/2021  hysterectomy 05/2021       Review of Systems  GI PROCEDURE:          Pacemaker/ AICD no.  Artificial heart valves no.  MI/heart attack no.  Abnormal heart rhythm no.  Angina no.  CVA no.  Hypertension no.  Hypotension no.  Asthma, COPD no.  Sleep apnea no.  Seizure disorders no.  Artificial joints no.  Severe DJD no.  Diabetes no.  Significant headaches YES.  Vertigo no.  Depression/anxiety YES.  Abnormal bleeding no.  Kidney Disease no.  Liver disease no.  Chance of pregnancy no.  Blood transfusion no.        Vital Signs  Wt 151.4, Wt change -11 lb, Ht 66, Temp 97.0, Pulse sitting 78, BP sitting 120/79.     Examination  Gastroenterology::        GENERAL APPEARANCE: Well developed, well nourished, no active distress, pleasant.         SCLERA: anicteric.         CARDIOVASCULAR Normal RRR .         RESPIRATORY Breath sounds normal. Respiration even and unlabored.         ABDOMEN No  masses palpated. Liver and spleen not palpated, normal. Bowel sounds normal, Abdomen not distended.         EXTREMITIES: No edema.         NEURO: alert, oriented to time, place and person, normal gait.         PSYCH: mood/affect normal.   Assessment and plan: Iron deficiency anemia EGD and colonoscopy.

## 2021-07-25 ENCOUNTER — Encounter (HOSPITAL_COMMUNITY)
Admission: RE | Disposition: A | Discharge status: Home / Self Care | Payer: Self-pay | Source: Home / Self Care | Attending: Gastroenterology

## 2021-07-25 ENCOUNTER — Other Ambulatory Visit: Payer: Self-pay

## 2021-07-25 ENCOUNTER — Ambulatory Visit (HOSPITAL_COMMUNITY)
Admission: RE | Admit: 2021-07-25 | Discharge: 2021-07-25 | Disposition: A | Discharge status: Home / Self Care | Payer: 59 | Attending: Gastroenterology | Admitting: Gastroenterology

## 2021-07-25 ENCOUNTER — Ambulatory Visit (HOSPITAL_COMMUNITY): Payer: 59 | Admitting: Certified Registered Nurse Anesthetist

## 2021-07-25 ENCOUNTER — Encounter (HOSPITAL_COMMUNITY): Payer: Self-pay | Admitting: Gastroenterology

## 2021-07-25 DIAGNOSIS — K319 Disease of stomach and duodenum, unspecified: Secondary | ICD-10-CM | POA: Diagnosis not present

## 2021-07-25 DIAGNOSIS — K2289 Other specified disease of esophagus: Secondary | ICD-10-CM | POA: Diagnosis not present

## 2021-07-25 DIAGNOSIS — K648 Other hemorrhoids: Secondary | ICD-10-CM | POA: Insufficient documentation

## 2021-07-25 DIAGNOSIS — D509 Iron deficiency anemia, unspecified: Secondary | ICD-10-CM | POA: Insufficient documentation

## 2021-07-25 DIAGNOSIS — K449 Diaphragmatic hernia without obstruction or gangrene: Secondary | ICD-10-CM | POA: Insufficient documentation

## 2021-07-25 DIAGNOSIS — K2980 Duodenitis without bleeding: Secondary | ICD-10-CM | POA: Diagnosis not present

## 2021-07-25 HISTORY — PX: ESOPHAGOGASTRODUODENOSCOPY (EGD) WITH PROPOFOL: SHX5813

## 2021-07-25 HISTORY — PX: BIOPSY: SHX5522

## 2021-07-25 HISTORY — PX: COLONOSCOPY WITH PROPOFOL: SHX5780

## 2021-07-25 SURGERY — COLONOSCOPY WITH PROPOFOL
Anesthesia: Monitor Anesthesia Care

## 2021-07-25 MED ORDER — ONDANSETRON HCL 4 MG/2ML IJ SOLN
INTRAMUSCULAR | Status: DC | PRN
  Administered 2021-07-25: 4 mg via INTRAVENOUS

## 2021-07-25 MED ORDER — PROPOFOL 10 MG/ML IV BOLUS
INTRAVENOUS | Status: DC | PRN
Start: 2021-07-25 — End: 2021-07-25
  Administered 2021-07-25: 30 mg via INTRAVENOUS
  Administered 2021-07-25: 20 mg via INTRAVENOUS
  Administered 2021-07-25 (×2): 40 mg via INTRAVENOUS
  Administered 2021-07-25: 20 mg via INTRAVENOUS
  Administered 2021-07-25 (×2): 30 mg via INTRAVENOUS
  Administered 2021-07-25: 40 mg via INTRAVENOUS

## 2021-07-25 MED ORDER — PROPOFOL 500 MG/50ML IV EMUL
INTRAVENOUS | Status: DC | PRN
  Administered 2021-07-25: 150 ug/kg/min via INTRAVENOUS

## 2021-07-25 MED ORDER — LIDOCAINE 2% (20 MG/ML) 5 ML SYRINGE
INTRAMUSCULAR | Status: DC | PRN
  Administered 2021-07-25: 60 mg via INTRAVENOUS

## 2021-07-25 MED ORDER — SODIUM CHLORIDE 0.9 % IV SOLN: INTRAVENOUS | Status: DC

## 2021-07-25 MED ORDER — LACTATED RINGERS IV SOLN: INTRAVENOUS | Status: DC

## 2021-07-25 SURGICAL SUPPLY — 25 items

## 2021-07-25 NOTE — Discharge Instructions (Signed)

## 2021-07-25 NOTE — Anesthesia Preprocedure Evaluation (Addendum)
Anesthesia Evaluation  Patient identified by MRN, date of birth, ID band Patient awake    Reviewed: Allergy & Precautions, NPO status , Patient's Chart, lab work & pertinent test results  History of Anesthesia Complications Negative for: history of anesthetic complications  Airway Mallampati: II  TM Distance: >3 FB Neck ROM: Full    Dental no notable dental hx.    Pulmonary neg pulmonary ROS,    Pulmonary exam normal        Cardiovascular negative cardio ROS Normal cardiovascular exam     Neuro/Psych Anxiety Depression Bipolar Disorder negative neurological ROS     GI/Hepatic Neg liver ROS,   Endo/Other  Hypothyroidism   Renal/GU negative Renal ROS  negative genitourinary   Musculoskeletal  (+) Arthritis ,   Abdominal   Peds  Hematology  (+) anemia , Hgb 10.8   Anesthesia Other Findings Day of surgery medications reviewed with patient.  Reproductive/Obstetrics negative OB ROS                            Anesthesia Physical Anesthesia Plan  ASA: 2  Anesthesia Plan: MAC   Post-op Pain Management: Minimal or no pain anticipated   Induction:   PONV Risk Score and Plan: 2 and Treatment may vary due to age or medical condition and Propofol infusion  Airway Management Planned: Natural Airway and Nasal Cannula  Additional Equipment: None  Intra-op Plan:   Post-operative Plan:   Informed Consent: I have reviewed the patients History and Physical, chart, labs and discussed the procedure including the risks, benefits and alternatives for the proposed anesthesia with the patient or authorized representative who has indicated his/her understanding and acceptance.       Plan Discussed with: CRNA  Anesthesia Plan Comments:        Anesthesia Quick Evaluation

## 2021-07-25 NOTE — Anesthesia Postprocedure Evaluation (Signed)
Anesthesia Post Note  Patient: Rebecca Holt  Procedure(s) Performed: COLONOSCOPY WITH PROPOFOL ESOPHAGOGASTRODUODENOSCOPY (EGD) WITH PROPOFOL BIOPSY     Patient location during evaluation: PACU Anesthesia Type: MAC Level of consciousness: awake and alert and oriented Pain management: pain level controlled Vital Signs Assessment: post-procedure vital signs reviewed and stable Respiratory status: spontaneous breathing, nonlabored ventilation and respiratory function stable Cardiovascular status: blood pressure returned to baseline Postop Assessment: no apparent nausea or vomiting Anesthetic complications: no   No notable events documented.  Last Vitals:  Vitals:   07/25/21 1300 07/25/21 1310  BP: (!) 101/54 103/62  Pulse: 66 71  Resp: 18 17  Temp:    SpO2: 90% 100%    Last Pain:  Vitals:   07/25/21 1310  TempSrc:   PainSc: 0-No pain                 Marthenia Rolling

## 2021-07-25 NOTE — Anesthesia Procedure Notes (Signed)
Procedure Name: MAC Date/Time: 07/25/2021 12:11 PM Performed by: Maxwell Caul, CRNA Pre-anesthesia Checklist: Patient identified, Emergency Drugs available, Suction available and Patient being monitored Oxygen Delivery Method: Simple face mask

## 2021-07-25 NOTE — Interval H&P Note (Signed)
History and Physical Interval Note: 31/female with IDA, without obvious Gi blood loss for an EGD and colonoscopy.  07/25/2021 11:25 AM  Rebecca Holt  has presented today for EGD and colonoscopy with the diagnosis of N/V, weight loss, Anemia.  The various methods of treatment have been discussed with the patient and family. After consideration of risks, benefits and other options for treatment, the patient has consented to  Procedure(s): COLONOSCOPY WITH PROPOFOL (N/A) ESOPHAGOGASTRODUODENOSCOPY (EGD) WITH PROPOFOL (N/A) as a surgical intervention.  The patient's history has been reviewed, patient examined, no change in status, stable for surgery.  I have reviewed the patient's chart and labs.  Questions were answered to the patient's satisfaction.     [name]

## 2021-07-25 NOTE — Op Note (Signed)
[facility] Patient Name: Rebecca Holt Procedure Date: 07/25/2021 MRN: [id number] Attending MD: [name] , MD Date of Birth: 10-06-1985 CSN: [id number] Age: 36 Admit Type: Outpatient Procedure:                Upper GI endoscopy Indications:              Unexplained iron deficiency anemia Providers:                [name], MD, Burtis Junes, RN, Luan Moore,                            Technician, Virgia Land, CRNA Referring MD:             Rosendo Gros Andy,MD Medicines:                Monitored Anesthesia Care Complications:            No immediate complications. Estimated blood loss:                            Minimal. Estimated Blood Loss:     Estimated blood loss was minimal. Procedure:                Pre-Anesthesia Assessment:                           - Prior to the procedure, a History and Physical                            was performed, and patient medications and                            allergies were reviewed. The patient's tolerance of                            previous anesthesia was also reviewed. The risks                            and benefits of the procedure and the sedation                            options and risks were discussed with the patient.                            All questions were answered, and informed consent                            was obtained. Prior Anticoagulants: The patient has                            taken no previous anticoagulant or antiplatelet                            agents. ASA Grade Assessment: II - A patient with  mild systemic disease. After reviewing the risks                            and benefits, the patient was deemed in                            satisfactory condition to undergo the procedure.                           After obtaining informed consent, the endoscope was                            passed under direct vision. Throughout the                             procedure, the patient's blood pressure, pulse, and                            oxygen saturations were monitored continuously. The                            [id number]-H190 ([id number] Olympus endoscope was introduced                            through the mouth, and advanced to the second part                            of duodenum. The upper GI endoscopy was                            accomplished without difficulty. The patient                            tolerated the procedure well. Scope In: Scope Out: Findings:      The upper third of the esophagus and middle third of the esophagus were       normal.      There were esophageal mucosal changes suspicious for Barrett's esophagus       present in the lower third of the esophagus. The maximum longitudinal       extent of these mucosal changes was 0.5 cm in length. Mucosa was       biopsied with a cold forceps for histology in a targeted manner in the       lower third of the esophagus. One specimen bottle was sent to pathology.      The entire examined stomach was normal. Biopsies were taken with a cold       forceps for Helicobacter pylori testing.      The cardia and gastric fundus were normal on retroflexion.      The examined duodenum was normal. Biopsies for histology were taken with       a cold forceps for evaluation of celiac disease.      A 2 cm hiatal hernia was present. Impression:               - Normal upper third of esophagus and middle third  of esophagus.                           - Esophageal mucosal changes suspicious for                            Barrett's esophagus. Biopsied.                           - Normal stomach. Biopsied.                           - Normal examined duodenum. Biopsied.                           - 2 cm hiatal hernia. Moderate Sedation:      Patient did not receive moderate sedation for this procedure, but       instead received monitored anesthesia care. Recommendation:            - Patient has a contact number available for                            emergencies. The signs and symptoms of potential                            delayed complications were discussed with the                            patient. Return to normal activities tomorrow.                            Written discharge instructions were provided to the                            patient.                           - Resume regular diet.                           - Continue present medications.                           - Await pathology results.                           - Perform a colonoscopy today. Procedure Code(s):        --- Professional ---                           [phone], Esophagogastroduodenoscopy, flexible,                            transoral; with biopsy, single or multiple Diagnosis Code(s):        --- Professional ---                           K22.8,  Other specified diseases of esophagus                           K44.9, Diaphragmatic hernia without obstruction or                            gangrene                           D50.9, Iron deficiency anemia, unspecified CPT copyright 2019 [employer]. All rights reserved. The codes documented in this report are preliminary and upon coder review may  be revised to meet current compliance requirements. [name], MD 07/25/2021 12:57:24 PM This report has been signed electronically. Number of Addenda: 0

## 2021-07-25 NOTE — Transfer of Care (Signed)
Immediate Anesthesia Transfer of Care Note  Patient: Rebecca Holt  Procedure(s) Performed: COLONOSCOPY WITH PROPOFOL ESOPHAGOGASTRODUODENOSCOPY (EGD) WITH PROPOFOL BIOPSY  Patient Location: PACU and Endoscopy Unit  Anesthesia Type:MAC  Level of Consciousness: awake, alert  and oriented  Airway & Oxygen Therapy: Patient Spontanous Breathing and Patient connected to face mask oxygen  Post-op Assessment: Report given to RN and Post -op Vital signs reviewed and stable  Post vital signs: Reviewed and stable  Last Vitals:  Vitals Value Taken Time  BP    Temp    Pulse    Resp    SpO2      Last Pain:  Vitals:   07/25/21 1035  TempSrc: Temporal  PainSc: 0-No pain         Complications: No notable events documented.

## 2021-07-25 NOTE — Op Note (Signed)
[facility] Patient Name: Rebecca Holt Procedure Date: 07/25/2021 MRN: [id number] Attending MD: [name] , MD Date of Birth: 1986-05-02 CSN: [id number] Age: 36 Admit Type: Outpatient Procedure:                Colonoscopy Indications:              This is the patient's first colonoscopy,                            Unexplained iron deficiency anemia Providers:                [name], MD, Burtis Junes, RN, Luan Moore,                            Technician, Virgia Land, CRNA Referring MD:             Rosendo Gros Andy,MD Medicines:                Monitored Anesthesia Care Complications:            No immediate complications. Estimated Blood Loss:     Estimated blood loss: none. Procedure:                Pre-Anesthesia Assessment:                           - Prior to the procedure, a History and Physical                            was performed, and patient medications and                            allergies were reviewed. The patient's tolerance of                            previous anesthesia was also reviewed. The risks                            and benefits of the procedure and the sedation                            options and risks were discussed with the patient.                            All questions were answered, and informed consent                            was obtained. Prior Anticoagulants: The patient has                            taken no previous anticoagulant or antiplatelet                            agents. ASA Grade Assessment: II - A patient with  mild systemic disease. After reviewing the risks                            and benefits, the patient was deemed in                            satisfactory condition to undergo the procedure.                           After obtaining informed consent, the colonoscope                            was passed under direct vision. Throughout the                             procedure, the patient's blood pressure, pulse, and                            oxygen saturations were monitored continuously. The                            [id number]-HQ190L ([id number]) Olympus colonoscope was                            introduced through the anus and advanced to the the                            terminal ileum. The colonoscopy was performed                            without difficulty. The patient tolerated the                            procedure well. The quality of the bowel                            preparation was fair to poor. Scope In: 12:32:29 PM Scope Out: 12:47:45 PM Scope Withdrawal Time: 0 hours 8 minutes 7 seconds  Total Procedure Duration: 0 hours 15 minutes 16 seconds  Findings:      The perianal and digital rectal examinations were normal.      The terminal ileum appeared normal.      Non-bleeding internal hemorrhoids were found during retroflexion, during       perianal exam and during endoscopy. The hemorrhoids were small.      A large amount of liquid, semi-liquid and semi-solid stool was found in       the entire colon, interfering with visualization. Lavage of the area was       performed, resulting in incomplete clearance with fair visualization.      The exam was otherwise without abnormality. Impression:               - Preparation of the colon was fair.                           - The examined portion of  the ileum was normal.                           - Non-bleeding internal hemorrhoids.                           - Stool in the entire examined colon.                           - The examination was otherwise normal.                           - No specimens collected. Moderate Sedation:      Patient did not receive moderate sedation for this procedure, but       instead received monitored anesthesia care. Recommendation:           - Resume regular diet.                           - Continue present medications.                           - Repeat  colonoscopy at age 77 for screening                            purposes. Procedure Code(s):        --- Professional ---                           [phone], Colonoscopy, flexible; diagnostic, including                            collection of specimen(s) by brushing or washing,                            when performed (separate procedure) Diagnosis Code(s):        --- Professional ---                           K64.8, Other hemorrhoids                           D50.9, Iron deficiency anemia, unspecified CPT copyright 2019 American [employer] Association. All rights reserved. The codes documented in this report are preliminary and upon coder review may  be revised to meet current compliance requirements. [name], MD 07/25/2021 12:53:37 PM This report has been signed electronically. Number of Addenda: 0

## 2021-07-26 ENCOUNTER — Encounter (HOSPITAL_COMMUNITY): Payer: Self-pay | Admitting: Gastroenterology

## 2021-07-26 LAB — SURGICAL PATHOLOGY

## 2021-08-16 ENCOUNTER — Telehealth: Payer: Self-pay

## 2021-08-16 NOTE — Telephone Encounter (Signed)
Error

## 2021-08-17 ENCOUNTER — Other Ambulatory Visit: Payer: Self-pay

## 2021-08-17 ENCOUNTER — Encounter: Payer: Self-pay | Admitting: Physician Assistant

## 2021-08-17 ENCOUNTER — Telehealth: Payer: 59 | Admitting: Physician Assistant

## 2021-08-17 VITALS — BP 95/62 | Ht 67.0 in

## 2021-08-17 DIAGNOSIS — G4486 Cervicogenic headache: Secondary | ICD-10-CM

## 2021-08-17 DIAGNOSIS — D509 Iron deficiency anemia, unspecified: Secondary | ICD-10-CM | POA: Diagnosis not present

## 2021-08-17 MED ORDER — KETOROLAC TROMETHAMINE 60 MG/2ML IM SOLN
60.0000 mg | Freq: Once | INTRAMUSCULAR | Status: AC
  Administered 2021-08-17: 60 mg via INTRAMUSCULAR

## 2021-08-17 NOTE — Progress Notes (Signed)
° °  Virtual Visit via Video Note  I connected with  Rebecca Holt  on 08/17/21 at 11:30 AM EST by a video enabled telemedicine application and verified that I am speaking with the correct person using two identifiers.  Location: Patient: home Provider: Therapist, music at [place] present: Patient and myself   I discussed the limitations of evaluation and management by telemedicine and the availability of in person appointments. The patient expressed understanding and agreed to proceed.   History of Present Illness:  Headache x 1 week. Same headaches she has been having - see visit from 07/19/21 with Dr. Cherlynn Kaiser and virtual visit from 05/18/21 with me. - No neuro signs. Taking Tylenol, Aleve, Ibuprofen. Triptans and steroids have not helped. She is ready for referral to neurology at this time. She is also following up with GI about iron deficiency.    Observations/Objective:   Gen: Awake, alert, appears tired Resp: Breathing is even and non-labored Psych: calm/pleasant demeanor Neuro: Alert and Oriented x 3, + facial symmetry, speech is clear.   Assessment and Plan:  1. Cervicogenic headache -Ongoing headaches, latest one has been going on x 1 week, no red flags, she will come to office after virtual visit for Toradol 60 mg IM -Referral to neurology at this time  2. Iron deficiency anemia, unspecified iron deficiency anemia type -EGD and colonoscopy with Dr. Therisa Doyne; she will f/up with them on this    Follow Up Instructions:    I discussed the assessment and treatment plan with the patient. The patient was provided an opportunity to ask questions and all were answered. The patient agreed with the plan and demonstrated an understanding of the instructions.   The patient was advised to call back or seek an in-person evaluation if the symptoms worsen or if the condition fails to improve as anticipated.  Man Effertz M Edmund Holcomb, PA-C

## 2021-08-21 ENCOUNTER — Encounter: Payer: Self-pay | Admitting: Neurology

## 2021-08-23 ENCOUNTER — Encounter: Payer: Self-pay | Admitting: Psychiatry

## 2021-08-23 ENCOUNTER — Telehealth (INDEPENDENT_AMBULATORY_CARE_PROVIDER_SITE_OTHER): Payer: 59 | Admitting: Psychiatry

## 2021-08-23 VITALS — BP 100/60 | Wt 133.0 lb

## 2021-08-23 DIAGNOSIS — F410 Panic disorder [episodic paroxysmal anxiety] without agoraphobia: Secondary | ICD-10-CM | POA: Diagnosis not present

## 2021-08-23 DIAGNOSIS — G4700 Insomnia, unspecified: Secondary | ICD-10-CM | POA: Diagnosis not present

## 2021-08-23 DIAGNOSIS — F39 Unspecified mood [affective] disorder: Secondary | ICD-10-CM | POA: Diagnosis not present

## 2021-08-23 MED ORDER — LAMOTRIGINE 25 MG PO TABS: 75.0000 mg | ORAL_TABLET | Freq: Every day | ORAL | 2 refills | Status: DC

## 2021-08-23 MED ORDER — ALPRAZOLAM 0.5 MG PO TABS: ORAL_TABLET | ORAL | 5 refills | Status: DC

## 2021-08-23 MED ORDER — BUPROPION HCL ER (XL) 150 MG PO TB24: 150.0000 mg | ORAL_TABLET | Freq: Every day | ORAL | 2 refills | Status: DC

## 2021-08-23 MED ORDER — MODAFINIL 200 MG PO TABS: 200.0000 mg | ORAL_TABLET | Freq: Every day | ORAL | 5 refills | Status: DC

## 2021-08-23 NOTE — Progress Notes (Signed)
Rebecca Holt [id number] December 12, 1985 36 y.o.  Subjective:   Patient ID:  Rebecca Holt is a 36 y.o. (DOB May 02, 1986) female.  Chief Complaint:  Chief Complaint  Patient presents with   Follow-up    Anxiety, depression, insomnia    HPI Rebecca Holt presents to the office today for follow-up of anxiety, depression, and insomnia. She reports that she was dx'd with pancreatic insufficiency. She had a partial hysterectomy in October. She had to have an iron infusion recently.   She has lost about 65 lbs since June. She reports continued unintentional weight loss. She reports that she has had to eliminate redmeat, fried foods, fatty foods due to GI s/s. She reports that she misses certain foods.   She reports having anxiety and mood lability. Denies impulsive or risky behavior. She denies panic attacks. She reports feeling "over-stimulated pretty much all the time" with health issues, stressors, etc. She reports that she has been trying to manage stress. She reports that her energy is very low. She has irritability and reports decreased patience for things. She reports that her mood has been down. She reports that she pushes herself to do things "because I have to." She reports that she has been withdrawing from others because of feeling depleted. She reports that she has been sleeping well with Xanax. Waking up at 5 am and starts her morning lists and tasks. She reports that concentration has been affected by anemia and  reports that her concentration has improved after iron infusion. Denies SI.   Reports taking Alprazolam at night and rarely takes it during the day.   Her son has pneumonia and has had multiple illnesses since the start of the school year.    Past Psychiatric Medication Trials: Ativan- partially effective Tranxene          Wellbutrin XL- Increased activation/agitation with 450 mg. Reports that Wellbutrin seemed to initially be effective.   Zoloft Lexapro Celexa- Had severe  discontinuation Pristiq Abilify Rexulti- Cost prohibitive  Olanzapine Risperidone- Involuntary orofacial movements Trileptal Lamictal- Had increased irritability and insomnia with 100 mg dose.  Ambien Modafinil   [place] Office Visit from 06/05/2020 in [place] Total Score 0      Flowsheet Row Admission (Discharged) from 07/25/2021 in [facility] ENDOSCOPY Admission (Discharged) from 04/23/2021 in [place] No Risk No Risk        Review of Systems:  Review of Systems  Gastrointestinal:  Positive for abdominal distention, abdominal pain, diarrhea and nausea.  Musculoskeletal:  Negative for gait problem.  Neurological:  Negative for tremors.  Psychiatric/Behavioral:         Please refer to HPI   Medications: I have reviewed the patient's current medications.  Current Outpatient Medications  Medication Sig Dispense Refill   acetaminophen (TYLENOL) 500 MG tablet Take 1,000 mg by mouth every 6 (six) hours as needed (for pain.).     B Complex-C (B-COMPLEX WITH VITAMIN C) tablet Take 1 tablet by mouth daily.     cholecalciferol (VITAMIN D3) 25 MCG (1000 UNIT) tablet Take 1,000 Units by mouth daily.     LYSINE PO Take 3 tablets by mouth in the morning.     ondansetron (ZOFRAN-ODT) 4 MG disintegrating tablet Take 4 mg by mouth every 8 (eight) hours as needed for vomiting or nausea.     ZENPEP 40000-126000 units CPEP Take 1-2 capsules by mouth See admin instructions. Take 1 capsule by mouth  with each snack & take 2 capsules by mouth with each meal     ALPRAZolam (XANAX) 0.5 MG tablet TAKE ONE TABLET BY MOUTH TWO TIMES A DAY AS NEEDED FOR ANXIETY 45 tablet 5   buPROPion (WELLBUTRIN XL) 150 MG 24 hr tablet Take 1 tablet (150 mg total) by mouth daily. 90 tablet 2   lamoTRIgine (LAMICTAL) 25 MG tablet Take 3 tablets (75 mg total) by mouth daily. 90 tablet 2   modafinil (PROVIGIL) 200 MG tablet Take 1 tablet  (200 mg total) by mouth daily. 30 tablet 5   naproxen sodium (ALEVE) 220 MG tablet Take 220 mg by mouth 2 (two) times daily as needed (pain.).     No current facility-administered medications for this visit.    Medication Side Effects: None  Allergies:  Allergies  Allergen Reactions   Tramadol Itching   Latex Other (See Comments)    Causes irritation.    Past Medical History:  Diagnosis Date   Anxiety    Arthritis    si joints   Bipolar disorder ([place])    Chicken pox    as child   Depressive disorder 02/08/2019   History of COVID-19 09/09/2020   g i issues x 2-3 weeks all symptoms reolved   History of hypothyroidism    yrs ago per pt on 04-17-2021   Pneumonia    yrs ago per pt on 04-17-2021   Wears glasses     Past Medical History, Surgical history, Social history, and Family history were reviewed and updated as appropriate.   Please see review of systems for further details on the patient's review from today.   Objective:   Physical Exam:  BP 100/60    Wt 133 lb (60.3 kg)    LMP 05/09/2021 (Approximate)    BMI 20.83 kg/m   Physical Exam Neurological:     Mental Status: She is alert and oriented to person, place, and time.     Cranial Nerves: No dysarthria.  Psychiatric:        Attention and Perception: Attention and perception normal.        Mood and Affect: Mood is anxious.        Speech: Speech normal.        Behavior: Behavior is cooperative.        Thought Content: Thought content normal. Thought content is not paranoid or delusional. Thought content does not include homicidal or suicidal ideation. Thought content does not include homicidal or suicidal plan.        Cognition and Memory: Cognition and memory normal.        Judgment: Judgment normal.     Comments: Insight intact Dysphoric mood    Lab Review:     Component Value Date/Time   NA 140 07/19/2021 1037   NA 141 02/02/2020 0000   K 3.9 07/19/2021 1037   CL 104 07/19/2021 1037   CO2 28  07/19/2021 1037   GLUCOSE 83 07/19/2021 1037   BUN 8 07/19/2021 1037   BUN 10 02/02/2020 0000   CREATININE 0.75 07/19/2021 1037   CALCIUM 9.3 07/19/2021 1037   PROT 6.9 07/19/2021 1037   ALBUMIN 4.6 07/19/2021 1037   AST 17 07/19/2021 1037   ALT 16 07/19/2021 1037   ALKPHOS 53 07/19/2021 1037   BILITOT 0.4 07/19/2021 1037   GFRNONAA >60 04/24/2021 0145   GFRAA 90.96 02/02/2020 0000       Component Value Date/Time   WBC 4.6 07/19/2021 1037   RBC  3.85 (L) 07/19/2021 1037   HGB 10.8 (L) 07/19/2021 1037   HCT 33.2 (L) 07/19/2021 1037   PLT 245.0 07/19/2021 1037   MCV 86.3 07/19/2021 1037   MCH 29.0 04/24/2021 0145   MCHC 32.4 07/19/2021 1037   RDW 13.8 07/19/2021 1037   LYMPHSABS 1.6 03/16/2019 0926   MONOABS 0.4 03/16/2019 0926   EOSABS 0.1 03/16/2019 0926   BASOSABS 0.0 03/16/2019 0926    No results found for: POCLITH, LITHIUM   No results found for: PHENYTOIN, PHENOBARB, VALPROATE, CBMZ   .res Assessment: Plan:   Pt seen for 30 minutes and time spent reviewing medical and social history. She reports that she does not want to change medication since she feels current medications have been helpful and are not causing side effects. She reports not wanting to start a new medication in the event that side effects exacerbate other s/s that she is trying to improve. Discussed therapy since previous therapist retired. She reports that she prefers not to re-start therapy at this time. Encouraged pt to contact office if she decides she would like to re-start therapy in the future.  Continue Xanax 0.5 mg BID prn anxiety.  Continue Wellbutrin XL 150 mg po qd for depression.  Continue Lamictal 75 mg po qd for depression.  Continue Modafinil 200 mg po qd for fatigue.  Pt to follow-up in 6 months or sooner if clinically indicated.  Patient advised to contact office with any questions, adverse effects, or acute worsening in signs and symptoms.    Dwayne was seen today for  follow-up.  Diagnoses and all orders for this visit:  Episodic mood disorder (HCC) -     buPROPion (WELLBUTRIN XL) 150 MG 24 hr tablet; Take 1 tablet (150 mg total) by mouth daily. -     lamoTRIgine (LAMICTAL) 25 MG tablet; Take 3 tablets (75 mg total) by mouth daily.  Insomnia, unspecified type -     ALPRAZolam (XANAX) 0.5 MG tablet; TAKE ONE TABLET BY MOUTH TWO TIMES A DAY AS NEEDED FOR ANXIETY  Panic attacks -     ALPRAZolam (XANAX) 0.5 MG tablet; TAKE ONE TABLET BY MOUTH TWO TIMES A DAY AS NEEDED FOR ANXIETY  Other orders -     modafinil (PROVIGIL) 200 MG tablet; Take 1 tablet (200 mg total) by mouth daily.     Please see After Visit Summary for patient specific instructions.  Future Appointments  Date Time Provider [place]  09/03/2021  7:50 AM Pieter Partridge, DO [facility] None  02/20/2022 10:00 AM Thayer Headings, PMHNP [facility] None    No orders of the defined types were placed in this encounter.   -------------------------------

## 2021-08-24 ENCOUNTER — Other Ambulatory Visit: Payer: Self-pay

## 2021-08-24 ENCOUNTER — Ambulatory Visit: Payer: 59 | Admitting: Family

## 2021-08-24 ENCOUNTER — Encounter: Payer: Self-pay | Admitting: Family

## 2021-08-24 VITALS — BP 130/86 | HR 85 | Temp 98.6°F | Ht 67.0 in | Wt 134.2 lb

## 2021-08-24 DIAGNOSIS — J069 Acute upper respiratory infection, unspecified: Secondary | ICD-10-CM

## 2021-08-24 NOTE — Patient Instructions (Signed)
It was very nice to see you today.  Continue OTC sinus/cold medications including generic Sudafed, Zyrtec D, or the Norel AD samples for about 5 more days and Ibuprofen up to 600mg  or generic Tylenol 1,000mg  every 6 hours for aches, pains, sore throat, or fever. OK to take Zinc up to 25mg  & up to 3,000mg  Vitamin C daily while having symptoms, then reduce doses to 3-4 days per week, or 1/2 dose daily to boost immunity.  As discussed, if you are prone to spring allergies I would Increase water intake to at least 2 liters daily.       PLEASE NOTE:  If you had any lab tests please let us know if you have not heard back within a few days. You may see your results on MyChart before we have a chance to review them but we will give you a call once they are reviewed by [place]. If we ordered any referrals today, please let us know if you have not heard from their office within the next week.   Please try these tips to maintain a healthy lifestyle:  Eat most of your calories during the day when you are active. Eliminate processed foods including packaged sweets (pies, cakes, cookies), reduce intake of potatoes, white bread, white pasta, and white rice. Look for whole grain options, oat flour or almond flour.  Each meal should contain half fruits/vegetables, one quarter protein, and one quarter carbs (no bigger than a computer mouse).  Cut down on sweet beverages. This includes juice, soda, and sweet tea. Also watch fruit intake, though this is a healthier sweet option, it still contains natural sugar! Limit to 3 servings daily.  Drink at least 1 glass of water with each meal and aim for at least 8 glasses per day  Exercise at least 150 minutes every week.

## 2021-08-24 NOTE — Progress Notes (Signed)
Subjective:     Patient ID: Rebecca Holt, female    DOB: 10/18/1985, 36 y.o.   MRN: [id number]  Chief Complaint  Patient presents with   Cough    All symptoms started 2 days ago, she states that her son was sick last Saturday. She has taken Mucinex, Robitussin, and Ibuprofen but still has symptoms.    Nasal Congestion   Wheezing   Fever    Low grade   HPI: Upper Respiratory Infection: Symptoms include achiness, low grade fever, nasal congestion, and productive cough with  green colored sputum.  Onset of symptoms was 2 days ago, unchanged since that time. She is drinking moderate amounts of fluids. Evaluation to date: none. Treatment to date:  home meds: Mucinex, Ibuprofen, cough syrup, Zyrtec D .  Health Maintenance Due  Topic Date Due   Hepatitis C Screening  Never done   COVID-19 Vaccine (2 - Booster for Janssen series) 03/14/2020    Past Medical History:  Diagnosis Date   Anxiety    Arthritis    si joints   Bipolar disorder ([place])    Chicken pox    as child   Depressive disorder 02/08/2019   History of COVID-19 09/09/2020   g i issues x 2-3 weeks all symptoms reolved   History of hypothyroidism    yrs ago per pt on 04-17-2021   Pneumonia    yrs ago per pt on 04-17-2021   Wears glasses     Past Surgical History:  Procedure Laterality Date   ANTERIOR AND POSTERIOR REPAIR N/A 04/23/2021   Procedure: POSTERIOR REPAIR (RECTOCELE);  Surgeon: Paula Compton, MD;  Location: [facility];  Service: Gynecology;  Laterality: N/A;   BIOPSY  07/25/2021   Procedure: BIOPSY;  Surgeon: [name], MD;  Location: [facility]OS[facility];  Service: Gastroenterology;;   COLONOSCOPY WITH PROPOFOL N/A 07/25/2021   Procedure: COLONOSCOPY WITH PROPOFOL;  Surgeon: [name], MD;  Location: [facility];  Service: Gastroenterology;  Laterality: N/A;   CYSTOSCOPY N/A 04/23/2021   Procedure: CYSTOSCOPY;  Surgeon: Paula Compton, MD;  Location: [facility];   Service: Gynecology;  Laterality: N/A;   ESOPHAGOGASTRODUODENOSCOPY (EGD) WITH PROPOFOL N/A 07/25/2021   Procedure: ESOPHAGOGASTRODUODENOSCOPY (EGD) WITH PROPOFOL;  Surgeon: [name], MD;  Location: [facility];  Service: Gastroenterology;  Laterality: N/A;   LAPAROSCOPIC VAGINAL HYSTERECTOMY WITH SALPINGECTOMY Bilateral 04/23/2021   Procedure: LAPAROSCOPIC ASSISTED VAGINAL HYSTERECTOMY WITH BILATERAL  SALPINGECTOMY;  Surgeon: Paula Compton, MD;  Location: [place];  Service: Gynecology;  Laterality: Bilateral;   TONSILLECTOMY Bilateral 2020   TRANSFORAMINAL LUMBAR INTERBODY FUSION (TLIF) WITH PEDICLE SCREW FIXATION 1 LEVEL N/A 05/07/2019   Procedure: Lumbar Five- Sacral One Transforaminal lumbar interbody Fusion;  Surgeon: Erline Levine, MD;  Location: [place];  Service: Neurosurgery;  Laterality: N/A;  Lumbar 5 Sacral 1 Transforaminal lumbar interbody fusion    Outpatient Medications Prior to Visit  Medication Sig Dispense Refill   acetaminophen (TYLENOL) 500 MG tablet Take 1,000 mg by mouth every 6 (six) hours as needed (for pain.).     ALPRAZolam (XANAX) 0.5 MG tablet TAKE ONE TABLET BY MOUTH TWO TIMES A DAY AS NEEDED FOR ANXIETY 45 tablet 5   B Complex-C (B-COMPLEX WITH VITAMIN C) tablet Take 1 tablet by mouth daily.     buPROPion (WELLBUTRIN XL) 150 MG 24 hr tablet Take 1 tablet (150 mg total) by mouth daily. 90 tablet 2   cholecalciferol (VITAMIN D3) 25 MCG (1000 UNIT) tablet Take 1,000 Units  by mouth daily.     lamoTRIgine (LAMICTAL) 25 MG tablet Take 3 tablets (75 mg total) by mouth daily. 90 tablet 2   LYSINE PO Take 3 tablets by mouth in the morning.     modafinil (PROVIGIL) 200 MG tablet Take 1 tablet (200 mg total) by mouth daily. 30 tablet 5   naproxen sodium (ALEVE) 220 MG tablet Take 220 mg by mouth 2 (two) times daily as needed (pain.).     ondansetron (ZOFRAN-ODT) 4 MG disintegrating tablet Take 4 mg by mouth every 8 (eight) hours as needed for vomiting  or nausea.     ZENPEP 40000-126000 units CPEP Take 1-2 capsules by mouth See admin instructions. Take 1 capsule by mouth with each snack & take 2 capsules by mouth with each meal     No facility-administered medications prior to visit.    Allergies  Allergen Reactions   Tramadol Itching   Latex Other (See Comments)    Causes irritation.        Objective:    Physical Exam Vitals and nursing note reviewed.  Constitutional:      Appearance: Normal appearance.  HENT:     Right Ear: Tympanic membrane and ear canal normal.     Left Ear: Tympanic membrane and ear canal normal.     Nose:     Right Sinus: No frontal sinus tenderness.     Left Sinus: No frontal sinus tenderness.     Mouth/Throat:     Mouth: Mucous membranes are moist.     Pharynx: Oropharyngeal exudate and posterior oropharyngeal erythema (mild) present. No pharyngeal swelling.  Cardiovascular:     Rate and Rhythm: Normal rate and regular rhythm.  Pulmonary:     Effort: Pulmonary effort is normal.     Breath sounds: Normal breath sounds.  Musculoskeletal:        General: Normal range of motion.  Lymphadenopathy:     Cervical: No cervical adenopathy.  Skin:    General: Skin is warm and dry.  Neurological:     Mental Status: She is alert.  Psychiatric:        Mood and Affect: Mood normal.        Behavior: Behavior normal.    BP 130/86    Pulse 85    Temp 98.6 F (37 C) (Temporal)    Ht 5\' 7"  (1.702 m)    Wt 134 lb 3.2 oz (60.9 kg)    LMP 05/09/2021 (Approximate)    SpO2 98%    BMI 21.02 kg/m  Wt Readings from Last 3 Encounters:  08/24/21 134 lb 3.2 oz (60.9 kg)  07/25/21 136 lb (61.7 kg)  07/19/21 142 lb 8 oz (64.6 kg)       Assessment & Plan:   Problem List Items Addressed This Visit   None Visit Diagnoses     Viral upper respiratory tract infection    -  Primary  2 days of sx, reports son sick for a week. Advised on continued conservative tx, may also have allergy sx overlapping and recommend  increasing daily antihistamine to bid, steroid nasal spray 1-2x/day. Drink plenty of water, samples of Norel AD given, advised on use & SE.

## 2021-09-03 ENCOUNTER — Telehealth: Payer: 59 | Admitting: Neurology
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
## 2021-09-10 ENCOUNTER — Other Ambulatory Visit: Payer: Self-pay

## 2021-09-10 ENCOUNTER — Ambulatory Visit: Payer: 59 | Admitting: Neurology

## 2021-09-10 ENCOUNTER — Encounter: Payer: Self-pay | Admitting: Neurology

## 2021-09-10 VITALS — BP 109/76 | HR 83 | Ht 68.0 in | Wt 131.4 lb

## 2021-09-10 DIAGNOSIS — G43009 Migraine without aura, not intractable, without status migrainosus: Secondary | ICD-10-CM | POA: Diagnosis not present

## 2021-09-10 MED ORDER — AIMOVIG 140 MG/ML ~~LOC~~ SOAJ: 140.0000 mg | SUBCUTANEOUS | 5 refills | Status: DC

## 2021-09-10 MED ORDER — RIZATRIPTAN BENZOATE 10 MG PO TBDP: 10.0000 mg | ORAL_TABLET | ORAL | 5 refills | Status: DC | PRN

## 2021-09-10 NOTE — Patient Instructions (Signed)
?  Start Aimovig '140mg'$  every 28 days.   ?Take rizatriptan '10mg'$  at earliest onset of headache.  May repeat dose once in 2 hours if needed.  Maximum 2 tablets in 24 hours.  Use ondansetron for nausea. ?Limit use of pain relievers to no more than 2 days out of the week.  These medications include acetaminophen, NSAIDs (ibuprofen/Advil/Motrin, naproxen/Aleve, triptans (Imitrex/sumatriptan), Excedrin, and narcotics.  This will help reduce risk of rebound headaches. ?Be aware of common food triggers: ? - Caffeine:  coffee, black tea, cola, Mt. Dew ? - Chocolate ? - Dairy:  aged cheeses (brie, blue, cheddar, gouda, [place], provolone, [place], Swiss, etc), chocolate milk, buttermilk, sour cream, limit eggs and yogurt ? - Nuts, peanut butter ? - Alcohol ? - Cereals/grains:  FRESH breads (fresh bagels, sourdough, doughnuts), yeast productions ? - Processed/canned/aged/cured meats (pre-packaged deli meats, hotdogs) ? - MSG/glutamate:  soy sauce, flavor enhancer, pickled/preserved/marinated foods ? - Sweeteners:  aspartame (Equal, Nutrasweet).  Sugar and Splenda are okay ? - Vegetables:  legumes (lima beans, lentils, snow peas, fava beans, pinto peans, peas, garbanzo beans), sauerkraut, onions, olives, pickles ? - Fruit:  avocados, bananas, citrus fruit (orange, lemon, grapefruit), mango ? - Other:  Frozen meals, macaroni and cheese ?Routine exercise ?Stay adequately hydrated (aim for 64 oz water daily) ?Keep headache diary ?Maintain proper stress management ?Maintain proper sleep hygiene ?Do not skip meals ?Consider supplements:  magnesium citrate '400mg'$  daily, riboflavin '400mg'$  daily, coenzyme Q10 '100mg'$  three times daily. ? ?

## 2021-09-10 NOTE — Therapy (Incomplete)
OUTPATIENT PHYSICAL THERAPY FEMALE PELVIC EVALUATION   Patient Name: Rebecca Lafortune MRN: [id number] DOB:02/09/1986, 36 y.o., female Today's Date: 09/10/2021    Past Medical History:  Diagnosis Date   Anxiety    Arthritis    si joints   Bipolar disorder ([place])    Chicken pox    as child   Depressive disorder 02/08/2019   History of COVID-19 09/09/2020   g i issues x 2-3 weeks all symptoms reolved   History of hypothyroidism    yrs ago per pt on 04-17-2021   Pneumonia    yrs ago per pt on 04-17-2021   Wears glasses    Past Surgical History:  Procedure Laterality Date   ANTERIOR AND POSTERIOR REPAIR N/A 04/23/2021   Procedure: POSTERIOR REPAIR (RECTOCELE);  Surgeon: Paula Compton, MD;  Location: [facility];  Service: Gynecology;  Laterality: N/A;   BIOPSY  07/25/2021   Procedure: BIOPSY;  Surgeon: [name], MD;  Location: [facility];  Service: Gastroenterology;;   COLONOSCOPY WITH PROPOFOL N/A 07/25/2021   Procedure: COLONOSCOPY WITH PROPOFOL;  Surgeon: [name], MD;  Location: [facility];  Service: Gastroenterology;  Laterality: N/A;   CYSTOSCOPY N/A 04/23/2021   Procedure: CYSTOSCOPY;  Surgeon: Paula Compton, MD;  [facility];  Service: Gynecology;  Laterality: N/A;   ESOPHAGOGASTRODUODENOSCOPY (EGD) WITH PROPOFOL N/A 07/25/2021   Procedure: ESOPHAGOGASTRODUODENOSCOPY (EGD) WITH PROPOFOL;  Surgeon: [name], MD;  Location: [facility];  Service: Gastroenterology;  Laterality: N/A;   LAPAROSCOPIC VAGINAL HYSTERECTOMY WITH SALPINGECTOMY Bilateral 04/23/2021   Procedure: LAPAROSCOPIC ASSISTED VAGINAL HYSTERECTOMY WITH BILATERAL  SALPINGECTOMY;  Surgeon: Paula Compton, MD;  Location: [place];  Service: Gynecology;  Laterality: Bilateral;   TONSILLECTOMY Bilateral 2020   TRANSFORAMINAL LUMBAR INTERBODY FUSION (TLIF) WITH PEDICLE SCREW FIXATION 1 LEVEL N/A 05/07/2019   Procedure: Lumbar Five- Sacral One  Transforaminal lumbar interbody Fusion;  Surgeon: Erline Levine, MD;  Location: [place];  Service: Neurosurgery;  Laterality: N/A;  Lumbar 5 Sacral 1 Transforaminal lumbar interbody fusion   Patient Active Problem List   Diagnosis Date Noted   Persistent cough 06/07/2021   Antibiotic-induced yeast infection 06/07/2021   S/P laparoscopic assisted vaginal hysterectomy (LAVH) 04/23/2021   Panic attacks 03/20/2020   Status post lumbar spinal fusion 03/20/2020   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  ONSET DATE: ***  SUBJECTIVE:  SUBJECTIVE STATEMENT: *** Fluid intake: Yes: ***   Patient confirms identification and approves PT to assess pelvic floor and treatment Yes  PERTINENT HISTORY:  3 vaginal deliveries, hysterectomy and rectocele repair 04/23/2022 Sexual abuse: {Yes/No:304960894}  PAIN:  Are you having pain? {yes/no:20286} NPRS scale: ***/10 Pain location: {pelvic pain location:27098}  Pain type: {type:313116} Pain description: {PAIN DESCRIPTION:21022940}   Aggravating factors: *** Relieving factors: ***   BOWEL MOVEMENT Pain with bowel movement: {yes/no:20286} Type of bowel movement:{PT BM type:27100} Fully empty rectum: {Yes/No:304960894} Leakage: {Yes/No:304960894} Pads: {Yes/No:304960894} Fiber supplement:  {Yes/No:304960894}  URINATION Pain with urination: {yes/no:20286} Fully empty bladder: {Yes/No:304960894} Stream: {PT urination:27102} Urgency: {Yes/No:304960894} Frequency: *** Leakage: {PT leakage:27103} Pads: {Yes/No:304960894}  INTERCOURSE Pain with intercourse: {pain with intercourse PA:27099} Ability to have vaginal penetration:  {Yes/No:304960894} Types of stimulation: *** Climax: {Yes/No:304960894} Marinoff Scale   PREGNANCY Vaginal deliveries *** Tearing {Yes/No:304960894} C-section deliveries *** Currently pregnant {Yes/No:304960894}   PRECAUTIONS: None  WEIGHT BEARING RESTRICTIONS No  FALLS:  Has patient fallen in last 6 months? No, Number of falls: 0  LIVING ENVIRONMENT: Lives with: lives with their family Lives in: House/apartment  OCCUPATION: ***  PLOF: Independent  PATIENT GOALS ***   OBJECTIVE:    PATIENT SURVEYS:  {rehab surveys:24030}  PFIQ-7 ***  COGNITION:  Overall cognitive status: {cognition:24006}     SENSATION:  Light touch: {intact/deficits:24005}  Proprioception: {intact/deficits:24005}  MUSCLE LENGTH: Hamstrings: Right *** deg; Left *** deg Marcello Moores test: Right *** deg; Left *** deg  POSTURE:  ***  PALPATION: Internal Pelvic Floor ***  External Perineal Exam ***  GENERAL ***  LUMBARAROM/PROM  A/PROM A/PROM  09/10/2021  Flexion   Extension   Right lateral flexion   Left lateral flexion   Right rotation   Left rotation    (Blank rows = not tested)  LE ROM:  {AROM/PROM:27142} ROM Right 09/10/2021 Left 09/10/2021  Hip flexion    Hip extension    Hip abduction    Hip adduction    Hip internal rotation    Hip external rotation    Knee flexion    Knee extension    Ankle dorsiflexion    Ankle plantarflexion    Ankle inversion    Ankle eversion     (Blank rows = not tested)  LE MMT:  MMT Right 09/10/2021 Left 09/10/2021  Hip flexion    Hip extension    Hip abduction    Hip adduction    Hip  internal rotation    Hip external rotation    Knee flexion    Knee extension    Ankle dorsiflexion    Ankle plantarflexion    Ankle inversion    Ankle eversion     PELVIC MMT:   MMT  09/10/2021  Vaginal   Internal Anal Sphincter   External Anal Sphincter   Puborectalis   Diastasis Recti   (Blank rows = not tested)   TONE: ***  PROLAPSE: ***  LUMBAR SPECIAL TESTS:  {lumbar special test:25242}  FUNCTIONAL TESTS:  {Functional tests:24029}  GAIT: Distance walked: *** Assistive device utilized: {Assistive devices:23999} Level of assistance: {Levels of assistance:24026} Comments: ***    TODAY'S TREATMENT  EVAL ***   PATIENT EDUCATION:  Education details: *** Person educated: Patient Education method: Consulting civil engineer, Media planner, Corporate treasurer cues, Verbal cues, and Handouts Education comprehension: verbalized understanding   HOME EXERCISE PROGRAM: ***  ASSESSMENT:  CLINICAL IMPRESSION: Patient is a 36 y.o. female who was seen today for physical therapy evaluation and treatment for ***.    OBJECTIVE  IMPAIRMENTS {opptimpairments:25111}.   ACTIVITY LIMITATIONS {activity limitations:25113}.   PERSONAL FACTORS 1-2 comorbidities: 3 vaginal deliveries, hysterectomy and rectocele repair 04/23/2022  are also affecting patient's functional outcome.    REHAB POTENTIAL: Good  CLINICAL DECISION MAKING: Stable/uncomplicated  EVALUATION COMPLEXITY: Low   GOALS: Goals reviewed with patient? Yes  SHORT TERM GOALS: Target date: {follow up:25551}  Pt will be independent with HEP.  Baseline:  Goal status: INITIAL  2.  *** Baseline: *** Goal status: {GOALSTATUS:25110}  3.  *** Baseline: *** Goal status: {GOALSTATUS:25110}  4.  *** Baseline: *** Goal status: {GOALSTATUS:25110}  5.  *** Baseline: *** Goal status: {GOALSTATUS:25110}  6.  *** Baseline: *** Goal status: {GOALSTATUS:25110}  LONG TERM GOALS: Target date: {follow up:25551}  Pt will be  independent with advanced HEP.  Baseline:  Goal status: INITIAL  2.  *** Baseline: *** Goal status: {GOALSTATUS:25110}  3.  *** Baseline: *** Goal status: {GOALSTATUS:25110}  4.  *** Baseline: *** Goal status: {GOALSTATUS:25110}  5.  *** Baseline: *** Goal status: {GOALSTATUS:25110}  6.  *** Baseline: *** Goal status: {GOALSTATUS:25110}   PLAN: PT FREQUENCY: 1x/week  PT DURATION: 12 weeks  PLANNED INTERVENTIONS: Therapeutic exercises, Therapeutic activity, Neuromuscular re-education, Balance training, Gait training, Patient/Family education, Joint mobilization, Dry Needling, and Manual therapy  PLAN FOR NEXT SESSION: ***   Jule Economy, PT 09/10/2021, 3:57 PM

## 2021-09-11 ENCOUNTER — Ambulatory Visit: Payer: 59 | Attending: Obstetrics and Gynecology

## 2021-09-11 DIAGNOSIS — M6281 Muscle weakness (generalized): Secondary | ICD-10-CM | POA: Insufficient documentation

## 2021-09-11 DIAGNOSIS — R279 Unspecified lack of coordination: Secondary | ICD-10-CM | POA: Insufficient documentation

## 2021-09-11 NOTE — Patient Instructions (Signed)
Urge suppression technique: ?A technique to help you hold urine until it?s an appropriate time to go, ?whether this is making it home or trying to reach a specific voiding time ?frame according to your schedule. ?It helps to send signals from the bladder to the brain that say you don?t ?actually have to void urine right now. ?This most likely only give you several minutes of relief at first, but repeat as ?needed; the benefit will last longer as you use this technique more and get ?into better bladder habits. ??  ?The technique: ?o Perform 5 quick flicks (Kegels) rapidly, not worrying about fully ?relaxing in between each (only in this technique). ?o Then perform several deep belly breaths while focusing on relaxing ?the pelvic floor. ?o Go do something else to help distract yourself from the urge to ?urinate. ?o Repeat as needed. ? ?Double-voiding: ? ?This technique is to help with post-void dribbling, or leaking a little bit when ?you stand up right after urinating. ? ?Use relaxed toileting mechanics to urinate as much as you feel like you ?have to without straining. ? ?Sit back upright from leaning forward and relax this way for 10-20 seconds. ? ?Lean forward again to finish voiding any amount more. ? ?

## 2021-09-12 ENCOUNTER — Encounter: Payer: Self-pay | Admitting: Neurology

## 2021-09-17 ENCOUNTER — Other Ambulatory Visit: Payer: Self-pay

## 2021-09-17 ENCOUNTER — Ambulatory Visit: Payer: 59

## 2021-09-17 DIAGNOSIS — R279 Unspecified lack of coordination: Secondary | ICD-10-CM

## 2021-09-17 DIAGNOSIS — M6281 Muscle weakness (generalized): Secondary | ICD-10-CM

## 2021-09-17 NOTE — Therapy (Signed)
?OUTPATIENT PHYSICAL THERAPY TREATMENT NOTE ? ? ?Patient Name: Rebecca Holt ?MRN: [id number] ?DOB:Oct 17, 1985, 36 y.o., female ?Today's Date: 09/17/2021 ? ?PCP: Leamon Arnt, MD ?REFERRING PROVIDER: Paula Compton, MD ? ? PT End of Session - 09/17/21 1059   ? ? Visit Number 2   ? Date for PT Re-Evaluation 12/04/21   ? Authorization Type Aetna   ? PT Start Time 1100   ? PT Stop Time 1141   ? PT Time Calculation (min) 41 min   ? ?  ?  ? ?  ? ? ?Past Medical History:  ?Diagnosis Date  ? Anxiety   ? Arthritis   ? si joints  ? Bipolar disorder ([place])   ? Chicken pox   ? as child  ? Depressive disorder 02/08/2019  ? History of COVID-19 09/09/2020  ? g i issues x 2-3 weeks all symptoms reolved  ? History of hypothyroidism   ? yrs ago per pt on 04-17-2021  ? Pneumonia   ? yrs ago per pt on 04-17-2021  ? Wears glasses   ? ?Past Surgical History:  ?Procedure Laterality Date  ? ANTERIOR AND POSTERIOR REPAIR N/A 04/23/2021  ? Procedure: POSTERIOR REPAIR (RECTOCELE);  Surgeon: Paula Compton, MD;  Location: [facility];  Service: Gynecology;  Laterality: N/A;  ? BIOPSY  07/25/2021  ? Procedure: BIOPSY;  Surgeon: [name], MD;  Location: [facility];  Service: Gastroenterology;;  ? COLONOSCOPY WITH PROPOFOL N/A 07/25/2021  ? Procedure: COLONOSCOPY WITH PROPOFOL;  Surgeon: [name], MD;  Location: [facility];  Service: Gastroenterology;  Laterality: N/A;  ? CYSTOSCOPY N/A 04/23/2021  ? Procedure: CYSTOSCOPY;  Surgeon: Paula Compton, MD;  Location: [facility];  Service: Gynecology;  Laterality: N/A;  ? ESOPHAGOGASTRODUODENOSCOPY (EGD) WITH PROPOFOL N/A 07/25/2021  ? Procedure: ESOPHAGOGASTRODUODENOSCOPY (EGD) WITH PROPOFOL;  Surgeon: [name], MD;  Location: [facility];  Service: Gastroenterology;  Laterality: N/A;  ? LAPAROSCOPIC VAGINAL HYSTERECTOMY WITH SALPINGECTOMY Bilateral 04/23/2021  ? Procedure: LAPAROSCOPIC ASSISTED VAGINAL HYSTERECTOMY WITH BILATERAL  SALPINGECTOMY;   Surgeon: Paula Compton, MD;  Location: [place];  Service: Gynecology;  Laterality: Bilateral;  ? TONSILLECTOMY Bilateral 2020  ? TRANSFORAMINAL LUMBAR INTERBODY FUSION (TLIF) WITH PEDICLE SCREW FIXATION 1 LEVEL N/A 05/07/2019  ? Procedure: Lumbar Five- Sacral One Transforaminal lumbar interbody Fusion;  Surgeon: Erline Levine, MD;  Location: [place];  Service: Neurosurgery;  Laterality: N/A;  Lumbar 5 Sacral 1 Transforaminal lumbar interbody fusion  ? ?Patient Active Problem List  ? Diagnosis Date Noted  ? Persistent cough 06/07/2021  ? Antibiotic-induced yeast infection 06/07/2021  ? S/P laparoscopic assisted vaginal hysterectomy (LAVH) 04/23/2021  ? Panic attacks 03/20/2020  ? Status post lumbar spinal fusion 03/20/2020  ? 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 Lumbar foraminal stenosis 05/07/2019  ? GAD (generalized anxiety disorder) 03/10/2019  ? OCD (obsessive compulsive disorder) 03/10/2019  ? Major depression, recurrent, chronic ([place]) 02/08/2019  ? Chronic tonsillitis 11/16/2018  ? Acquired hypothyroidism 08/02/2010  ? ? ?REFERRING DIAG: R39.14 (ICD-10-CM) - Feeling of incomplete bladder emptying ? ?THERAPY DIAG:  ?Muscle weakness (generalized) ? ?Unspecified lack of coordination ? ?PERTINENT HISTORY: 3 vaginal deliveries, hysterectomy and rectocele repair 04/23/2022; lumbar spinal fusion (L5/S1) 2020 ? ?PRECAUTIONS: NA ? ?SUBJECTIVE: Pt has filled out bladder journals and found that she goes very frequently and she gets more uncomfortable throughout the day. She goes to the bathroom 12-15x/day to urinate. She has tried the urge suppression technique, but feels like there were many days in  which she was unable to make that work for her.  ? ?PAIN:  ?Are you having pain? No ? ?SUBJECTIVE3/14/2023:                                                                                                                                                                                          ?   ?SUBJECTIVE STATEMENT: ?Pt states that she had hysterectomy in October due to heavy, painful menstrual cycles/pain with intercourse in which they also repaired rectocele. She  feels like recovery from surgery has gone well. She does report SUI. She states that intercourse got better after hysterectomy, but last week started to become painful again. She also feels like she has had an increase in vaginal dryness, but MD checked hormones and they are all fine (partial hysterectomy). ?Fluid intake: Yes: 32-48 oz of water; several cups of coffee in the morning   ?  ?Patient confirms identification and approves PT to assess pelvic floor and treatment Yes ?  ?  ?BOWEL MOVEMENT ?Pain with bowel movement: No ?Type of bowel movement:Frequency some days she can't go and then other days she goes many times due to pancreatic insufficiency and Strain No ?Fully empty rectum: No ?Leakage: No ?Pads: No ?Fiber supplement: No ?  ?URINATION ?Pain with urination: No ?Fully empty bladder: Yes: feels like she has to go often and right after she has gone; no post-void dribbling ?Stream: Strong ?Urgency: Yes: occasionally  ?Frequency: every 30 minutes; 2x/night ?Leakage: Coughing, Sneezing, and Laughing ?Pads: No ?  ?INTERCOURSE ?Pain with intercourse: During Penetration - different pain from before her hysterectomy which was stabbing pain; no she feels dryness/fullness (dry tampon) ?Ability to have vaginal penetration:  Yes: but uncomfortable ?Types of stimulation: - ?Climax: Yes: no pain ?Marinoff Scale  ?  ?PREGNANCY ?Vaginal deliveries 3 ?Tearing Yes: 1st delivery, minor ?C-section deliveries 0 ?Currently pregnant No ?  ?  ?PRECAUTIONS: None ?  ?WEIGHT BEARING RESTRICTIONS No ?  ?FALLS:  ?Has patient fallen in last 6 months? No, Number of falls: 0 ?  ?LIVING ENVIRONMENT: ?Lives with: lives with their family ?Lives in: House/apartment ?  ?OCCUPATION: no ?  ?PLOF: Independent ?  ?PATIENT GOALS leak less often ?  ?  ?OBJECTIVE:  ?  ?   ?COGNITION: ?           Overall cognitive status: Within functional limits for tasks assessed              ?            ?  ?POSTURE:  ?Posterior pelvic tilt, forward head/rounded shoulders ?  ?PALPATION: ?Internal Pelvic Floor Mild  increase in levator ani tenderness, Rt>Lt; some bladder restriction ?  ?External Perineal Exam some palor, otherwise WNL ?  ?GENERAL DRA 1 finger width separation at umbilicus; tenderness in upper Rt quadrant; tension/restriction bil lower quadrant without tenderness ?  ?  ?PELVIC MMT: ?  ?MMT   ?09/11/2021  ?Vaginal 2/5, 3 second hold, 10 repetitions  ?Internal Anal Sphincter    ?External Anal Sphincter    ?Puborectalis    ?Diastasis Recti 1 finger width separation  ?(Blank rows = not tested) ?  ?  ?TONE: ?low ?  ?PROLAPSE: ?WNL ?  ?  ?  ?TODAY'S 09/17/21:  ?-Soft tissue mobilization to abdomen and bladder  ?-Open books 10x bil ?-Bridge with ball squeeze 2 x 10 ?-Cat cow 2 x 10 ?-Hip flexor stretch 60 sec bil ?-Happy baby 2 min ? ?TREATMENT 09/11/21 ?EVAL  ?Treatment: Pelvic floor contraction training 47W quick flicks with breath coordination; urge suppression technique ?  ?  ?PATIENT EDUCATION:  ?Education details: Pt education performed on strict bladder retraining and vulvovaginal massage ?Person educated: Patient ?Education method: Explanation, Demonstration, Tactile cues, Verbal cues, and Handouts ?Education comprehension: verbalized understanding ?  ?  ?HOME EXERCISE PROGRAM: ?[place] ?  ?ASSESSMENT: ?  ?CLINICAL IMPRESSION: ?Pt did well with initial implementation of urge suppression technique and pelvic floor contractions, but bladder journal demonstrated high frequency of 12-15x/day. She was agreeable to bladder retraining and vulvovaginal massage to help improve bladder habits and vulvar moisturization. Abdominal restriction present, especially over bladder; good tolerance to mobilization techniques in order to help improve comfort with full bladder. She tolerated all mobility  and gentle strengthening exercises very well and HEP updated. She will benefit from skilled PT intervention in order to address impairments, decrease urinary frequency/leaking, decrease dyspareunia, and improve QOL

## 2021-09-17 NOTE — Patient Instructions (Signed)
Bladder/bowel retraining: ? ?Drink 4-8oz an hour ?ONLY WATER ?Stop water intake 3 hours before bed ?Try to go at least 2-3 hours between trips to the bathroom - go whether you need to or not. ? ?For two weeks. ? ? ?Vulvar/vaginal Massage: ?This is a technique to help decrease painful sensitivity in the vaginal area. It ?can also help to restore normal moisture levels in the vaginal tissues. ?With coconut oil, aloe, jojoba oil, or a specific vaginal moisturizer, gently ?massage into vaginal tissues. ?Think of this as part of your post-shower routine and moisturizing just like ?you would the rest of the body with lotion. ?This helps to increase good blood flow to the vaginal tissues. ?In addition, it also teaches the body that touch to the vagina does not have ?to be painful or threatening, but moisturizing and gentle. ? ?V-magic ? ?

## 2021-09-21 ENCOUNTER — Telehealth: Payer: Self-pay | Admitting: Neurology

## 2021-09-21 NOTE — Telephone Encounter (Signed)
Caller states she saw Dr last week and he called in a med that needs prior [place].  She states is it still hung up at the pharmacy or is it due to insurance. ? ?Access note in mailbox ?09/20/2021 ?5:15PM ?

## 2021-09-24 ENCOUNTER — Other Ambulatory Visit: Payer: Self-pay

## 2021-09-24 ENCOUNTER — Other Ambulatory Visit (HOSPITAL_COMMUNITY): Payer: Self-pay

## 2021-09-24 ENCOUNTER — Ambulatory Visit: Payer: 59

## 2021-09-24 DIAGNOSIS — M6281 Muscle weakness (generalized): Secondary | ICD-10-CM | POA: Diagnosis not present

## 2021-09-24 DIAGNOSIS — R279 Unspecified lack of coordination: Secondary | ICD-10-CM

## 2021-09-24 NOTE — Therapy (Signed)
?OUTPATIENT PHYSICAL THERAPY TREATMENT NOTE ? ? ?Patient Name: Rebecca Holt ?MRN: [id number] ?DOB:21-Jan-1986, 36 y.o., female ?Today's Date: 09/24/2021 ? ?PCP: Leamon Arnt, MD ?REFERRING PROVIDER: Paula Compton, MD ? ? PT End of Session - 09/24/21 0847   ? ? Visit Number 3   ? Date for PT Re-Evaluation 12/04/21   ? Authorization Type Aetna   ? PT Start Time 0848   ? PT Stop Time 0929   ? PT Time Calculation (min) 41 min   ? Activity Tolerance Patient tolerated treatment well   ? Behavior During Therapy [facility] for tasks assessed/performed   ? ?  ?  ? ?  ? ? ?Past Medical History:  ?Diagnosis Date  ? Anxiety   ? Arthritis   ? si joints  ? Bipolar disorder ([place])   ? Chicken pox   ? as child  ? Depressive disorder 02/08/2019  ? History of COVID-19 09/09/2020  ? g i issues x 2-3 weeks all symptoms reolved  ? History of hypothyroidism   ? yrs ago per pt on 04-17-2021  ? Pneumonia   ? yrs ago per pt on 04-17-2021  ? Wears glasses   ? ?Past Surgical History:  ?Procedure Laterality Date  ? ANTERIOR AND POSTERIOR REPAIR N/A 04/23/2021  ? Procedure: POSTERIOR REPAIR (RECTOCELE);  Surgeon: Paula Compton, MD;  Location: [facility];  Service: Gynecology;  Laterality: N/A;  ? BIOPSY  07/25/2021  ? Procedure: BIOPSY;  Surgeon: [name], MD;  Location: [facility];  Service: Gastroenterology;;  ? COLONOSCOPY WITH PROPOFOL N/A 07/25/2021  ? Procedure: COLONOSCOPY WITH PROPOFOL;  Surgeon: [name], MD;  Location: [facility];  Service: Gastroenterology;  Laterality: N/A;  ? CYSTOSCOPY N/A 04/23/2021  ? Procedure: CYSTOSCOPY;  Surgeon: Paula Compton, MD;  Location: [facility];  Service: Gynecology;  Laterality: N/A;  ? ESOPHAGOGASTRODUODENOSCOPY (EGD) WITH PROPOFOL N/A 07/25/2021  ? Procedure: ESOPHAGOGASTRODUODENOSCOPY (EGD) WITH PROPOFOL;  Surgeon: [name], MD;  Location: [facility];  Service: Gastroenterology;  Laterality: N/A;  ? LAPAROSCOPIC VAGINAL HYSTERECTOMY WITH  SALPINGECTOMY Bilateral 04/23/2021  ? Procedure: LAPAROSCOPIC ASSISTED VAGINAL HYSTERECTOMY WITH BILATERAL  SALPINGECTOMY;  Surgeon: Paula Compton, MD;  Location: [place];  Service: Gynecology;  Laterality: Bilateral;  ? TONSILLECTOMY Bilateral 2020  ? TRANSFORAMINAL LUMBAR INTERBODY FUSION (TLIF) WITH PEDICLE SCREW FIXATION 1 LEVEL N/A 05/07/2019  ? Procedure: Lumbar Five- Sacral One Transforaminal lumbar interbody Fusion;  Surgeon: Erline Levine, MD;  Location: [place];  Service: Neurosurgery;  Laterality: N/A;  Lumbar 5 Sacral 1 Transforaminal lumbar interbody fusion  ? ?Patient Active Problem List  ? Diagnosis Date Noted  ? Persistent cough 06/07/2021  ? Antibiotic-induced yeast infection 06/07/2021  ? S/P laparoscopic assisted vaginal hysterectomy (LAVH) 04/23/2021  ? Panic attacks 03/20/2020  ? Status post lumbar spinal fusion 03/20/2020  ? 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 Lumbar foraminal stenosis 05/07/2019  ? GAD (generalized anxiety disorder) 03/10/2019  ? OCD (obsessive compulsive disorder) 03/10/2019  ? Major depression, recurrent, chronic ([place]) 02/08/2019  ? Chronic tonsillitis 11/16/2018  ? Acquired hypothyroidism 08/02/2010  ? ? ?REFERRING DIAG: R39.14 (ICD-10-CM) - Feeling of incomplete bladder emptying ? ?THERAPY DIAG:  ?Muscle weakness (generalized) ? ?Unspecified lack of coordination ? ?PERTINENT HISTORY: 3 vaginal deliveries, hysterectomy and rectocele repair 04/23/2022; lumbar spinal fusion (L5/S1) 2020 ? ?PRECAUTIONS: NA ? ?SUBJECTIVE: Pt states that she is not having to cross legs when she blows her nose. She is not sure if frequency of urination is decreasing  at all, but feels like journaling is helpful. She is trying to cut out caffeine, but not noticing any changes with that yet. She is working on being more diligent with dipping water throughout the day. She has not started vulvar massage due to cream being lost in the mail. She was forced to hold bladder for  longer than normal several times last week. She has had several times where she has made it 3 hours, but usually 1.5-2 hours. She is waiting until she has to go instead of just kind of having to go. She feels like distraction works more than 5 quick flicks.  ? ?PAIN:  ?Are you having pain? No ? ?SUBJECTIVE3/14/2023:                                                                                                                                                                                          ?  ?SUBJECTIVE STATEMENT: ?Pt states that she had hysterectomy in October due to heavy, painful menstrual cycles/pain with intercourse in which they also repaired rectocele. She  feels like recovery from surgery has gone well. She does report SUI. She states that intercourse got better after hysterectomy, but last week started to become painful again. She also feels like she has had an increase in vaginal dryness, but MD checked hormones and they are all fine (partial hysterectomy). ?Fluid intake: Yes: 32-48 oz of water; several cups of coffee in the morning   ?  ?Patient confirms identification and approves PT to assess pelvic floor and treatment Yes ?  ?  ?BOWEL MOVEMENT ?Pain with bowel movement: No ?Type of bowel movement:Frequency some days she can't go and then other days she goes many times due to pancreatic insufficiency and Strain No ?Fully empty rectum: No ?Leakage: No ?Pads: No ?Fiber supplement: No ?  ?URINATION ?Pain with urination: No ?Fully empty bladder: Yes: feels like she has to go often and right after she has gone; no post-void dribbling ?Stream: Strong ?Urgency: Yes: occasionally  ?Frequency: every 30 minutes; 2x/night ?Leakage: Coughing, Sneezing, and Laughing ?Pads: No ?  ?INTERCOURSE ?Pain with intercourse: During Penetration - different pain from before her hysterectomy which was stabbing pain; no she feels dryness/fullness (dry tampon) ?Ability to have vaginal penetration:  Yes: but  uncomfortable ?Types of stimulation: - ?Climax: Yes: no pain ?Marinoff Scale  ?  ?PREGNANCY ?Vaginal deliveries 3 ?Tearing Yes: 1st delivery, minor ?C-section deliveries 0 ?Currently pregnant No ?  ?  ?PRECAUTIONS: None ?  ?WEIGHT BEARING RESTRICTIONS No ?  ?FALLS:  ?Has patient fallen in last 6 months? No, Number of falls: 0 ?  ?LIVING ENVIRONMENT: ?Lives  with: lives with their family ?Lives in: House/apartment ?  ?OCCUPATION: no ?  ?PLOF: Independent ?  ?PATIENT GOALS leak less often ?  ?  ?OBJECTIVE 09/24/21: Decreased lower thoracic mobility and bil SIJ; increased curvature in lower thoracic spine and sacrum; bil lumbar paraspinal restriction/scar tissue restriction; R pelvic rotation ?  ?  ?COGNITION: ?           Overall cognitive status: Within functional limits for tasks assessed              ?            ?  ?POSTURE:  ?Posterior pelvic tilt, forward head/rounded shoulders ?  ?PALPATION: ?Internal Pelvic Floor Mild increase in levator ani tenderness, Rt>Lt; some bladder restriction ?  ?External Perineal Exam some palor, otherwise WNL ?  ?GENERAL DRA 1 finger width separation at umbilicus; tenderness in upper Rt quadrant; tension/restriction bil lower quadrant without tenderness ?  ?  ?PELVIC MMT: ?  ?MMT   ?09/11/2021  ?Vaginal 2/5, 3 second hold, 10 repetitions  ?Internal Anal Sphincter    ?External Anal Sphincter    ?Puborectalis    ?Diastasis Recti 1 finger width separation  ?(Blank rows = not tested) ?  ?  ?TONE: ?low ?  ?PROLAPSE: ?WNL ?  ?Treatment 09/24/21:  ?Manual: ? -lower thoracic/upper lumbar spine mobilizations grade 2/3 ? -myofascial release to lumbar paraspinals and lumbar scar tissue  ? -Soft tissue mobilization to abdomen and bladder  ?Exercise: ? -bent knee fall out with core activation 2 x 10 ? -clam shells 2 x 10 bil ? -Butterfly 10 breaths ?-SKTC 10x bil ?-Cat cow 2 x 10 ? -Fire hydrants 2 x 10 bil ?  ?Treatment 09/17/21:  ?-Soft tissue mobilization to abdomen and bladder  ?-Open books 10x  bil ?-Bridge with ball squeeze 2 x 10 ?-Cat cow 2 x 10 ?-Hip flexor stretch 60 sec bil ?-Happy baby 2 min ? ?TREATMENT 09/11/21 ?EVAL  ?Treatment: Pelvic floor contraction training 41D quick flicks with breath coordination; urg

## 2021-09-24 NOTE — Telephone Encounter (Signed)
Submitted a Prior Authorization request to [facility] for  Aimovig '140mg'$   via CoverMyMeds. Will update once we receive a response. ? ? ?(Key: [id number]) ? ?

## 2021-09-25 NOTE — Telephone Encounter (Signed)
Received notification patient's PA needs to be submitted through [employer] Portal ? ?Submitted a Prior Authorization request to  [employer]  for  Aimovig '140mg'$   via PromptPAPortal. Will update once we receive a response. ?    ?Prior Auth (EOC) ID:  [id number] ?  ?

## 2021-09-27 ENCOUNTER — Other Ambulatory Visit (HOSPITAL_COMMUNITY): Payer: Self-pay

## 2021-09-27 ENCOUNTER — Telehealth: Payer: Self-pay

## 2021-09-27 NOTE — Telephone Encounter (Signed)
Patient Advocate Encounter ? ?Prior Authorization for Aimovig '140mg'$ /ml auto-injector has been approved.   ? ?PA# [id number] ? ?Effective dates: 09/26/21 through 12/27/21 ? ?Refill too soon. ? ?Patient Advocate ?Fax: [phone]  ?

## 2021-10-03 ENCOUNTER — Ambulatory Visit: Payer: 59 | Attending: Obstetrics and Gynecology

## 2021-10-03 DIAGNOSIS — R279 Unspecified lack of coordination: Secondary | ICD-10-CM | POA: Insufficient documentation

## 2021-10-03 DIAGNOSIS — M6281 Muscle weakness (generalized): Secondary | ICD-10-CM | POA: Diagnosis present

## 2021-10-03 NOTE — Therapy (Signed)
?OUTPATIENT PHYSICAL THERAPY TREATMENT NOTE ? ? ?Patient Name: Rebecca Holt ?MRN: [id number] ?DOB:1986/06/10, 36 y.o., female ?Today's Date: 10/03/2021 ? ?PCP: Leamon Arnt, MD ?REFERRING PROVIDER: Paula Compton, MD ? ? PT End of Session - 10/03/21 0844   ? ? Visit Number 4   ? Date for PT Re-Evaluation 12/04/21   ? Authorization Type [employer]   ? PT Start Time 0845   ? PT Stop Time 0925   ? PT Time Calculation (min) 40 min   ? Activity Tolerance Patient tolerated treatment well   ? Behavior During Therapy [facility] for tasks assessed/performed   ? ?  ?  ? ?  ? ? ?Past Medical History:  ?Diagnosis Date  ? Anxiety   ? Arthritis   ? si joints  ? Bipolar disorder ([place])   ? Chicken pox   ? as child  ? Depressive disorder 02/08/2019  ? History of COVID-19 09/09/2020  ? g i issues x 2-3 weeks all symptoms reolved  ? History of hypothyroidism   ? yrs ago per pt on 04-17-2021  ? Pneumonia   ? yrs ago per pt on 04-17-2021  ? Wears glasses   ? ?Past Surgical History:  ?Procedure Laterality Date  ? ANTERIOR AND POSTERIOR REPAIR N/A 04/23/2021  ? Procedure: POSTERIOR REPAIR (RECTOCELE);  Surgeon: Paula Compton, MD;  Location: [facility];  Service: Gynecology;  Laterality: N/A;  ? BIOPSY  07/25/2021  ? Procedure: BIOPSY;  Surgeon: [name], MD;  Location: [facility];  Service: Gastroenterology;;  ? COLONOSCOPY WITH PROPOFOL N/A 07/25/2021  ? Procedure: COLONOSCOPY WITH PROPOFOL;  Surgeon: [name], MD;  Location: [facility];  Service: Gastroenterology;  Laterality: N/A;  ? CYSTOSCOPY N/A 04/23/2021  ? Procedure: CYSTOSCOPY;  Surgeon: Paula Compton, MD;  Location: [facility];  Service: Gynecology;  Laterality: N/A;  ? ESOPHAGOGASTRODUODENOSCOPY (EGD) WITH PROPOFOL N/A 07/25/2021  ? Procedure: ESOPHAGOGASTRODUODENOSCOPY (EGD) WITH PROPOFOL;  Surgeon: [name], MD;  Location: [facility];  Service: Gastroenterology;  Laterality: N/A;  ? LAPAROSCOPIC VAGINAL HYSTERECTOMY WITH  SALPINGECTOMY Bilateral 04/23/2021  ? Procedure: LAPAROSCOPIC ASSISTED VAGINAL HYSTERECTOMY WITH BILATERAL  SALPINGECTOMY;  Surgeon: Paula Compton, MD;  Location: [place];  Service: Gynecology;  Laterality: Bilateral;  ? TONSILLECTOMY Bilateral 2020  ? TRANSFORAMINAL LUMBAR INTERBODY FUSION (TLIF) WITH PEDICLE SCREW FIXATION 1 LEVEL N/A 05/07/2019  ? Procedure: Lumbar Five- Sacral One Transforaminal lumbar interbody Fusion;  Surgeon: Erline Levine, MD;  Location: [place];  Service: Neurosurgery;  Laterality: N/A;  Lumbar 5 Sacral 1 Transforaminal lumbar interbody fusion  ? ?Patient Active Problem List  ? Diagnosis Date Noted  ? Persistent cough 06/07/2021  ? Antibiotic-induced yeast infection 06/07/2021  ? S/P laparoscopic assisted vaginal hysterectomy (LAVH) 04/23/2021  ? Panic attacks 03/20/2020  ? Status post lumbar spinal fusion 03/20/2020  ? 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 Lumbar foraminal stenosis 05/07/2019  ? GAD (generalized anxiety disorder) 03/10/2019  ? OCD (obsessive compulsive disorder) 03/10/2019  ? Major depression, recurrent, chronic ([place]) 02/08/2019  ? Chronic tonsillitis 11/16/2018  ? Acquired hypothyroidism 08/02/2010  ? ? ?REFERRING DIAG: R39.14 (ICD-10-CM) - Feeling of incomplete bladder emptying ? ?THERAPY DIAG:  ?Muscle weakness (generalized) ? ?Unspecified lack of coordination ? ?PERTINENT HISTORY: 3 vaginal deliveries, hysterectomy and rectocele repair 04/23/2022; lumbar spinal fusion (L5/S1) 2020 ? ?PRECAUTIONS: NA ? ?SUBJECTIVE: Pt states that she is feeling pretty good. She states that she tried to have sex the other night and reported no discomfort, even without  use of lubrication. She feels like urgency continues to improve.  ? ?PAIN:  ?Are you having pain? No ? ?SUBJECTIVE3/14/2023:                                                                                                                                                                                           ?  ?SUBJECTIVE STATEMENT: ?Pt states that she had hysterectomy in October due to heavy, painful menstrual cycles/pain with intercourse in which they also repaired rectocele. She  feels like recovery from surgery has gone well. She does report SUI. She states that intercourse got better after hysterectomy, but last week started to become painful again. She also feels like she has had an increase in vaginal dryness, but MD checked hormones and they are all fine (partial hysterectomy). ?Fluid intake: Yes: 32-48 oz of water; several cups of coffee in the morning   ?  ?Patient confirms identification and approves PT to assess pelvic floor and treatment Yes ?  ?  ?BOWEL MOVEMENT ?Pain with bowel movement: No ?Type of bowel movement:Frequency some days she can't go and then other days she goes many times due to pancreatic insufficiency and Strain No ?Fully empty rectum: No ?Leakage: No ?Pads: No ?Fiber supplement: No ?  ?URINATION ?Pain with urination: No ?Fully empty bladder: Yes: feels like she has to go often and right after she has gone; no post-void dribbling ?Stream: Strong ?Urgency: Yes: occasionally  ?Frequency: every 30 minutes; 2x/night ?Leakage: Coughing, Sneezing, and Laughing ?Pads: No ?  ?INTERCOURSE ?Pain with intercourse: During Penetration - different pain from before her hysterectomy which was stabbing pain; no she feels dryness/fullness (dry tampon) ?Ability to have vaginal penetration:  Yes: but uncomfortable ?Types of stimulation: - ?Climax: Yes: no pain ?Marinoff Scale  ?  ?PREGNANCY ?Vaginal deliveries 3 ?Tearing Yes: 1st delivery, minor ?C-section deliveries 0 ?Currently pregnant No ?  ?  ?PRECAUTIONS: None ?  ?WEIGHT BEARING RESTRICTIONS No ?  ?FALLS:  ?Has patient fallen in last 6 months? No, Number of falls: 0 ?  ?LIVING ENVIRONMENT: ?Lives with: lives with their family ?Lives in: House/apartment ?  ?OCCUPATION: no ?  ?PLOF: Independent ?  ?PATIENT GOALS leak less often ?  ?   ?OBJECTIVE 09/24/21: Decreased lower thoracic mobility and bil SIJ; increased curvature in lower thoracic spine and sacrum; bil lumbar paraspinal restriction/scar tissue restriction; R pelvic rotation ?  ?  ?COGNITION: ?           Overall cognitive status: Within functional limits for tasks assessed              ?            ?  ?  POSTURE:  ?Posterior pelvic tilt, forward head/rounded shoulders ?  ?PALPATION: ?Internal Pelvic Floor Mild increase in levator ani tenderness, Rt>Lt; some bladder restriction ?  ?External Perineal Exam some palor, otherwise WNL ?  ?GENERAL DRA 1 finger width separation at umbilicus; tenderness in upper Rt quadrant; tension/restriction bil lower quadrant without tenderness ?  ?  ?PELVIC MMT: ?  ?MMT   ?09/11/2021  ?Vaginal 2/5, 3 second hold, 10 repetitions  ?Internal Anal Sphincter    ?External Anal Sphincter    ?Puborectalis    ?Diastasis Recti 1 finger width separation  ?(Blank rows = not tested) ?  ?  ?TONE: ?low ?  ?PROLAPSE: ?WNL ?  ?TREATMENT 10/03/21:  ?Manual: ?Soft tissue mobilization: ?Scar tissue mobilization: ?Myofascial release: ?Spinal mobilization: ?Internal pelvic floor techniques: ?Dry needling: ?Neuromuscular re-education: ?Core retraining: 5 minutes with multimodal cues ?Core facilitation: ?Supine march 2 x 10 ?Leg extensions 10x bil ?Bird-dog 10x bil ?Bear crawl holds 5 x 5 seconds ?Form correction: ?Pelvic floor contraction training: ?Down training: ?Exercises: ?Stretches/mobility: ?Child's pose 10 breaths ?Strengthening: ?Bridge with ball squeeze 2 x 10 ?Clam shells 2 x 10 ?Fire hydrants 10x bil ?3-way kick 10x ea, bil ?Therapeutic activities: ?Functional strengthening activities: ?Self-care: ? ? ? ?Treatment 09/24/21:  ?Manual: ? -lower thoracic/upper lumbar spine mobilizations grade 2/3 ? -myofascial release to lumbar paraspinals and lumbar scar tissue  ? -Soft tissue mobilization to abdomen and bladder  ?Exercise: ? -bent knee fall out with core activation 2 x  10 ? -clam shells 2 x 10 bil ? -Butterfly 10 breaths ?-SKTC 10x bil ?-Cat cow 2 x 10 ? -Fire hydrants 2 x 10 bil ?  ?Treatment 09/17/21:  ?-Soft tissue mobilization to abdomen and bladder  ?-Open books 10x bil ?-Bridge with ball sq

## 2021-10-08 ENCOUNTER — Ambulatory Visit: Payer: 59

## 2021-10-09 NOTE — Telephone Encounter (Signed)
Sorry looks like the team accidentally made another encounter- PA was approved: ? ? ?

## 2021-10-10 ENCOUNTER — Ambulatory Visit: Payer: 59 | Admitting: Physician Assistant

## 2021-10-10 ENCOUNTER — Encounter: Payer: Self-pay | Admitting: Physician Assistant

## 2021-10-10 VITALS — BP 118/78 | HR 99 | Ht 68.0 in | Wt 128.4 lb

## 2021-10-10 DIAGNOSIS — R5383 Other fatigue: Secondary | ICD-10-CM

## 2021-10-10 DIAGNOSIS — E039 Hypothyroidism, unspecified: Secondary | ICD-10-CM

## 2021-10-10 DIAGNOSIS — R52 Pain, unspecified: Secondary | ICD-10-CM | POA: Diagnosis not present

## 2021-10-10 DIAGNOSIS — J029 Acute pharyngitis, unspecified: Secondary | ICD-10-CM

## 2021-10-10 DIAGNOSIS — D509 Iron deficiency anemia, unspecified: Secondary | ICD-10-CM | POA: Diagnosis not present

## 2021-10-10 LAB — CBC WITH DIFFERENTIAL/PLATELET
Basophils Absolute: 0 10*3/uL (ref 0.0–0.1)
Basophils Relative: 0.3 % (ref 0.0–3.0)
Eosinophils Absolute: 0 10*3/uL (ref 0.0–0.7)
Eosinophils Relative: 0.2 % (ref 0.0–5.0)
HCT: 39.2 % (ref 36.0–46.0)
Hemoglobin: 13.4 g/dL (ref 12.0–15.0)
Lymphocytes Relative: 11.2 % — ABNORMAL LOW (ref 12.0–46.0)
Lymphs Abs: 0.9 10*3/uL (ref 0.7–4.0)
MCHC: 34.1 g/dL (ref 30.0–36.0)
MCV: 92.7 fl (ref 78.0–100.0)
Monocytes Absolute: 0.4 10*3/uL (ref 0.1–1.0)
Monocytes Relative: 4.7 % (ref 3.0–12.0)
Neutro Abs: 6.9 10*3/uL (ref 1.4–7.7)
Neutrophils Relative %: 83.6 % — ABNORMAL HIGH (ref 43.0–77.0)
Platelets: 265 10*3/uL (ref 150.0–400.0)
RBC: 4.23 Mil/uL (ref 3.87–5.11)
RDW: 17.4 % — ABNORMAL HIGH (ref 11.5–15.5)
WBC: 8.3 10*3/uL (ref 4.0–10.5)

## 2021-10-10 LAB — POCT INFLUENZA A/B: Influenza A, POC: NEGATIVE

## 2021-10-10 LAB — POC COVID19 BINAXNOW: SARS Coronavirus 2 Ag: NEGATIVE

## 2021-10-10 MED ORDER — MAGIC MOUTHWASH W/LIDOCAINE: 5.0000 mL | Freq: Three times a day (TID) | ORAL | 1 refills | Status: DC | PRN

## 2021-10-10 NOTE — Progress Notes (Signed)
? ?Subjective:  ? ? Patient ID: Rebecca Holt, female    DOB: 11-21-1985, 36 y.o.   MRN: [id number] ? ?Chief Complaint  ?Patient presents with  ? Sore Throat  ?  Pt stated that her symptoms started Sunday night. Has not gotten worse moreso the same. She has some body aches as well. No one in the house is sick and has not been around no one that she knows of.  ? ? ?HPI ?Patient is in today for the following: ? ?Chief complaint: Sore throat ?Symptom onset: 3 days ago ?Pertinent positives: Body aches, fatigue ?Pertinent negatives: Congestion, runny nose, fever, n/v/d, abd pain ?Treatments tried: Robitussin, Tylenol, Aleve ?Sick exposure: ?church, otherwise no known sick contacts ? ?At-home COVID-19 test two days ago was negative. ? ?Patient also reports that she had an iron infusion the end of February and says she felt great for a few weeks, no longer felt extremely tired. Now she's very tired again and requesting labs to be rechecked. She is tearful and says she is tired of repeatedly feeling this way.  ? ?Past Medical History:  ?Diagnosis Date  ? Anxiety   ? Arthritis   ? si joints  ? Bipolar disorder ([place])   ? Chicken pox   ? as child  ? Depressive disorder 02/08/2019  ? History of COVID-19 09/09/2020  ? g i issues x 2-3 weeks all symptoms reolved  ? History of hypothyroidism   ? yrs ago per pt on 04-17-2021  ? Pneumonia   ? yrs ago per pt on 04-17-2021  ? Wears glasses   ? ? ?Past Surgical History:  ?Procedure Laterality Date  ? ANTERIOR AND POSTERIOR REPAIR N/A 04/23/2021  ? Procedure: POSTERIOR REPAIR (RECTOCELE);  Surgeon: Paula Compton, MD;  Location: [facility];  Service: Gynecology;  Laterality: N/A;  ? BIOPSY  07/25/2021  ? Procedure: BIOPSY;  Surgeon: [name], MD;  Location: [facility];  Service: Gastroenterology;;  ? COLONOSCOPY WITH PROPOFOL N/A 07/25/2021  ? Procedure: COLONOSCOPY WITH PROPOFOL;  Surgeon: [name], MD;  Location: [facility];  Service: Gastroenterology;   Laterality: N/A;  ? CYSTOSCOPY N/A 04/23/2021  ? Procedure: CYSTOSCOPY;  Surgeon: Paula Compton, MD;  Location: [facility];  Service: Gynecology;  Laterality: N/A;  ? ESOPHAGOGASTRODUODENOSCOPY (EGD) WITH PROPOFOL N/A 07/25/2021  ? Procedure: ESOPHAGOGASTRODUODENOSCOPY (EGD) WITH PROPOFOL;  Surgeon: [name], MD;  Location: [facility];  Service: Gastroenterology;  Laterality: N/A;  ? LAPAROSCOPIC VAGINAL HYSTERECTOMY WITH SALPINGECTOMY Bilateral 04/23/2021  ? Procedure: LAPAROSCOPIC ASSISTED VAGINAL HYSTERECTOMY WITH BILATERAL  SALPINGECTOMY;  Surgeon: Paula Compton, MD;  Location: [place];  Service: Gynecology;  Laterality: Bilateral;  ? TONSILLECTOMY Bilateral 2020  ? TRANSFORAMINAL LUMBAR INTERBODY FUSION (TLIF) WITH PEDICLE SCREW FIXATION 1 LEVEL N/A 05/07/2019  ? Procedure: Lumbar Five- Sacral One Transforaminal lumbar interbody Fusion;  Surgeon: Erline Levine, MD;  Location: [place];  Service: Neurosurgery;  Laterality: N/A;  Lumbar 5 Sacral 1 Transforaminal lumbar interbody fusion  ? ? ?Family History  ?Problem Relation Age of Onset  ? Diabetic kidney disease Father   ? Hyperlipidemia Father   ? Diabetes Father   ? Bipolar disorder Mother   ? Mental illness Mother   ?     bipolar  ? Cancer Paternal Grandfather   ?     colon  ? Diabetes Paternal Grandfather   ? Thyroid cancer Maternal Grandmother   ? Cancer Maternal Grandfather   ?     leukemia  ? Bipolar  disorder Son   ? Anesthesia problems Neg Hx   ? Hypotension Neg Hx   ? Malignant hyperthermia Neg Hx   ? Pseudochol deficiency Neg Hx   ? ? ?Social History  ? ?Tobacco Use  ? Smoking status: Never  ? Smokeless tobacco: Never  ?Vaping Use  ? Vaping Use: Never used  ?Substance Use Topics  ? Alcohol use: No  ? Drug use: No  ?  ? ?Allergies  ?Allergen Reactions  ? Tramadol Itching  ? Latex Other (See Comments)  ?  Causes irritation.  ? ? ?Review of Systems ?NEGATIVE UNLESS OTHERWISE INDICATED IN HPI ? ? ?   ?Objective:   ?  ? ?BP 118/78   Pulse 99   Ht '5\' 8"'$  (1.727 m)   Wt 128 lb 6.4 oz (58.2 kg)   LMP 05/09/2021 (Approximate)   SpO2 98%   BMI 19.52 kg/m?  ? ?Wt Readings from Last 3 Encounters:  ?10/10/21 128 lb 6.4 oz (58.2 kg)  ?09/11/21 131 lb 6.4 oz (59.6 kg)  ?08/24/21 134 lb 3.2 oz (60.9 kg)  ? ? ?BP Readings from Last 3 Encounters:  ?10/10/21 118/78  ?09/11/21 109/76  ?08/24/21 130/86  ?  ? ?Physical Exam ?Vitals and nursing note reviewed.  ?Constitutional:   ?   Appearance: Normal appearance. She is not toxic-appearing.  ?   Comments: Overall pallor  ?HENT:  ?   Head: Normocephalic and atraumatic.  ?   Right Ear: Tympanic membrane, ear canal and external ear normal.  ?   Left Ear: Tympanic membrane, ear canal and external ear normal.  ?   Nose: Nose normal.  ?   Mouth/Throat:  ?   Mouth: Mucous membranes are moist.  ?   Pharynx: Posterior oropharyngeal erythema (soft palate) present.  ?   Comments: Tonsils absent ?Eyes:  ?   Extraocular Movements: Extraocular movements intact.  ?   Conjunctiva/sclera: Conjunctivae normal.  ?   Pupils: Pupils are equal, round, and reactive to light.  ?Cardiovascular:  ?   Rate and Rhythm: Normal rate and regular rhythm.  ?   Pulses: Normal pulses.  ?   Heart sounds: Normal heart sounds.  ?Pulmonary:  ?   Effort: Pulmonary effort is normal.  ?   Breath sounds: Normal breath sounds.  ?Abdominal:  ?   General: Abdomen is flat.  ?Musculoskeletal:     ?   General: Normal range of motion.  ?   Cervical back: Normal range of motion and neck supple.  ?Skin: ?   General: Skin is warm and dry.  ?Neurological:  ?   Mental Status: She is alert and oriented to person, place, and time.  ?Psychiatric:     ?   Mood and Affect: Mood normal.     ?   Behavior: Behavior normal.     ?   Thought Content: Thought content normal.     ?   Judgment: Judgment normal.  ? ? ?   ?Assessment & Plan:  ? ?Problem List Items Addressed This Visit   ? ?  ? Endocrine  ? Acquired hypothyroidism  ? Relevant Orders  ?  Thyroid Panel With TSH  ? ?Other Visit Diagnoses   ? ? Sore throat    -  Primary  ? Relevant Orders  ? POCT Influenza A/B (Completed)  ? POC COVID-19 (Completed)  ? Culture, Group A Strep  ? Thyroid Panel With TSH  ? CBC with Differential/Platelet (Completed)  ? Epstein-Barr virus VCA, IgM  ?  Body aches      ? Relevant Orders  ? POCT Influenza A/B (Completed)  ? POC COVID-19 (Completed)  ? Culture, Group A Strep  ? Other fatigue      ? Relevant Orders  ? Thyroid Panel With TSH  ? CBC with Differential/Platelet (Completed)  ? Epstein-Barr virus VCA, IgM  ? IBC + Ferritin  ? Iron deficiency anemia, unspecified iron deficiency anemia type      ? Relevant Orders  ? CBC with Differential/Platelet (Completed)  ? IBC + Ferritin  ? ?  ? ? ? ?Meds ordered this encounter  ?Medications  ? magic mouthwash w/lidocaine SOLN  ?  Sig: Take 5 mLs by mouth 3 (three) times daily as needed for up to 10 days for mouth pain.  ?  Dispense:  100 mL  ?  Refill:  1  ?  1:1 Ratio of diphenhydramine HCL and Lidocaine HCL only  ? ?Plan: ?-She has what I believe to be 2 separate issues going on today. ?-Acute pharyngitis - Rapid Strep A not available, sent for culture. In office POC COVID and influenza negative. Conservative tx at this time. ?-Ongoing severe fatigue, hx anemia (has seen GI, also has hx hysterectomy), recent iron transfusion - recheck some labs today, she may need to see hematology for further work-up ? ?ER precautions discussed ? ?F/up pending labs ? ?Henok Heacock M Nahjae Hoeg, PA-C ?

## 2021-10-10 NOTE — Patient Instructions (Signed)
Good to see you today! ?Please go to the lab for blood work and I will send results through [place]. ? ?COVID and Flu tests negative. ? ?Strep culture pending. ? ?Use magic mouthwash to help with symptoms.  ? ?

## 2021-10-11 ENCOUNTER — Telehealth: Payer: Self-pay | Admitting: Family Medicine

## 2021-10-11 LAB — THYROID PANEL WITH TSH
Free Thyroxine Index: 1.7 (ref 1.4–3.8)
T3 Uptake: 29 % (ref 22–35)
T4, Total: 5.9 ug/dL (ref 5.1–11.9)
TSH: 1.93 mIU/L

## 2021-10-11 LAB — EPSTEIN-BARR VIRUS VCA, IGM: EBV VCA IgM: <36 U/mL

## 2021-10-11 NOTE — Telephone Encounter (Signed)
Pt has called back regarding the status on this rx. Please advise ?

## 2021-10-11 NOTE — Telephone Encounter (Signed)
Pt states pharmacy has informed her that her medication was not received. Please advise. ? ?MEDICATION:magic mouthwash w/lidocaine SOLN ? ?PHARMACY: ?[facility] [id number], [place] Phone:  [phone]  ?Fax:  [phone]  ?  ? ?

## 2021-10-11 NOTE — Telephone Encounter (Signed)
Please advise 

## 2021-10-12 ENCOUNTER — Other Ambulatory Visit: Payer: Self-pay | Admitting: Physician Assistant

## 2021-10-12 LAB — CULTURE, GROUP A STREP
MICRO NUMBER:: 13253901
SPECIMEN QUALITY:: ADEQUATE

## 2021-10-12 MED ORDER — MAGIC MOUTHWASH W/LIDOCAINE: 5.0000 mL | Freq: Three times a day (TID) | ORAL | 1 refills | Status: AC | PRN

## 2021-10-12 NOTE — Telephone Encounter (Signed)
Reprinted and faxed today.  ?

## 2021-10-15 ENCOUNTER — Ambulatory Visit: Payer: 59

## 2021-10-15 ENCOUNTER — Telehealth: Payer: Self-pay | Admitting: Family Medicine

## 2021-10-15 DIAGNOSIS — M6281 Muscle weakness (generalized): Secondary | ICD-10-CM

## 2021-10-15 DIAGNOSIS — R279 Unspecified lack of coordination: Secondary | ICD-10-CM

## 2021-10-15 NOTE — Telephone Encounter (Signed)
Noted and agreed, thank you. 

## 2021-10-15 NOTE — Therapy (Signed)
?OUTPATIENT PHYSICAL THERAPY TREATMENT NOTE ? ? ?Patient Name: Rebecca Holt ?MRN: [id number] ?DOB:[birth year], 36 y.o., female ?Today's Date: 10/15/2021 ? ?PCP: Leamon Arnt, MD ?REFERRING PROVIDER: Leamon Arnt, MD ? ? PT End of Session - 10/15/21 1142   ? ? Visit Number 5   ? Date for PT Re-Evaluation 12/04/21   ? Authorization Type [employer]   ? PT Start Time 1145   ? PT Stop Time 1225   ? PT Time Calculation (min) 40 min   ? Activity Tolerance Patient tolerated treatment well   ? Behavior During Therapy [facility] for tasks assessed/performed   ? ?  ?  ? ?  ? ? ?Past Medical History:  ?Diagnosis Date  ? Anxiety   ? Arthritis   ? si joints  ? Bipolar disorder ([place])   ? Chicken pox   ? as child  ? Depressive disorder 02/08/2019  ? History of COVID-19 09/09/2020  ? g i issues x 2-3 weeks all symptoms reolved  ? History of hypothyroidism   ? yrs ago per pt on 04-17-2021  ? Pneumonia   ? yrs ago per pt on 04-17-2021  ? Wears glasses   ? ?Past Surgical History:  ?Procedure Laterality Date  ? ANTERIOR AND POSTERIOR REPAIR N/A 04/23/2021  ? Procedure: POSTERIOR REPAIR (RECTOCELE);  Surgeon: Paula Compton, MD;  Location: [facility];  Service: Gynecology;  Laterality: N/A;  ? BIOPSY  07/25/2021  ? Procedure: BIOPSY;  Surgeon: [name], MD;  Location: [facility];  Service: Gastroenterology;;  ? COLONOSCOPY WITH PROPOFOL N/A 07/25/2021  ? Procedure: COLONOSCOPY WITH PROPOFOL;  Surgeon: [name], MD;  Location: [facility];  Service: Gastroenterology;  Laterality: N/A;  ? CYSTOSCOPY N/A 04/23/2021  ? Procedure: CYSTOSCOPY;  Surgeon: Paula Compton, MD;  Location: [facility];  Service: Gynecology;  Laterality: N/A;  ? ESOPHAGOGASTRODUODENOSCOPY (EGD) WITH PROPOFOL N/A 07/25/2021  ? Procedure: ESOPHAGOGASTRODUODENOSCOPY (EGD) WITH PROPOFOL;  Surgeon: [name], MD;  Location: [facility];  Service: Gastroenterology;  Laterality: N/A;  ? LAPAROSCOPIC VAGINAL HYSTERECTOMY WITH SALPINGECTOMY  Bilateral 04/23/2021  ? Procedure: LAPAROSCOPIC ASSISTED VAGINAL HYSTERECTOMY WITH BILATERAL  SALPINGECTOMY;  Surgeon: Paula Compton, MD;  Location: [facility];  Service: Gynecology;  Laterality: Bilateral;  ? TONSILLECTOMY Bilateral 2020  ? TRANSFORAMINAL LUMBAR INTERBODY FUSION (TLIF) WITH PEDICLE SCREW FIXATION 1 LEVEL N/A 05/07/2019  ? Procedure: Lumbar Five- Sacral One Transforaminal lumbar interbody Fusion;  Surgeon: Erline Levine, MD;  Location: [place];  Service: Neurosurgery;  Laterality: N/A;  Lumbar 5 Sacral 1 Transforaminal lumbar interbody fusion  ? ?Patient Active Problem List  ? Diagnosis Date Noted  ? Persistent cough 06/07/2021  ? Antibiotic-induced yeast infection 06/07/2021  ? S/P laparoscopic assisted vaginal hysterectomy (LAVH) 04/23/2021  ? Panic attacks 03/20/2020  ? Status post lumbar spinal fusion 03/20/2020  ? 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 Lumbar foraminal stenosis 05/07/2019  ? GAD (generalized anxiety disorder) 03/10/2019  ? OCD (obsessive compulsive disorder) 03/10/2019  ? Major depression, recurrent, chronic ([place]) 02/08/2019  ? Chronic tonsillitis 11/16/2018  ? Acquired hypothyroidism 08/02/2010  ? ? ?REFERRING DIAG: R39.14 (ICD-10-CM) - Feeling of incomplete bladder emptying ? ?THERAPY DIAG:  ?Muscle weakness (generalized) ? ?Unspecified lack of coordination ? ?PERTINENT HISTORY: 3 vaginal deliveries, hysterectomy and rectocele repair 04/23/2022; lumbar spinal fusion (L5/S1) 2020 ? ?PRECAUTIONS: NA ? ?SUBJECTIVE: Pt states that she was not feeling well last week and lost her voice. She does not feel like it is related to allergies.  She states that she was keeping up with exercises until she started not feeling well. Even with increased coughing last week, she only had one episode of urinary incontinence which she feels like is big progress. She did have some pain during last intercourse. She does feel like pain had gotten better since starting PT. She  feels like urgency is also improving.  ? ?PAIN:  ?Are you having pain? No ? ?SUBJECTIVE3/14/2023:                                                                                                                                                                                          ?  ?SUBJECTIVE STATEMENT: ?Pt states that she had hysterectomy in October due to heavy, painful menstrual cycles/pain with intercourse in which they also repaired rectocele. She  feels like recovery from surgery has gone well. She does report SUI. She states that intercourse got better after hysterectomy, but last week started to become painful again. She also feels like she has had an increase in vaginal dryness, but MD checked hormones and they are all fine (partial hysterectomy). ?Fluid intake: Yes: 32-48 oz of water; several cups of coffee in the morning   ?  ?Patient confirms identification and approves PT to assess pelvic floor and treatment Yes ?  ?  ?BOWEL MOVEMENT ?Pain with bowel movement: No ?Type of bowel movement:Frequency some days she can't go and then other days she goes many times due to pancreatic insufficiency and Strain No ?Fully empty rectum: No ?Leakage: No ?Pads: No ?Fiber supplement: No ?  ?URINATION ?Pain with urination: No ?Fully empty bladder: Yes: feels like she has to go often and right after she has gone; no post-void dribbling ?Stream: Strong ?Urgency: Yes: occasionally  ?Frequency: every 30 minutes; 2x/night ?Leakage: Coughing, Sneezing, and Laughing ?Pads: No ?  ?INTERCOURSE ?Pain with intercourse: During Penetration - different pain from before her hysterectomy which was stabbing pain; no she feels dryness/fullness (dry tampon) ?Ability to have vaginal penetration:  Yes: but uncomfortable ?Types of stimulation: - ?Climax: Yes: no pain ?Marinoff Scale  ?  ?PREGNANCY ?Vaginal deliveries 3 ?Tearing Yes: 1st delivery, minor ?C-section deliveries 0 ?Currently pregnant No ?  ?  ?PRECAUTIONS: None ?  ?WEIGHT  BEARING RESTRICTIONS No ?  ?FALLS:  ?Has patient fallen in last 6 months? No, Number of falls: 0 ?  ?LIVING ENVIRONMENT: ?Lives with: lives with their family ?Lives in: House/apartment ?  ?OCCUPATION: no ?  ?PLOF: Independent ?  ?PATIENT GOALS leak less often ?  ?  ?OBJECTIVE 09/24/21: Decreased lower thoracic mobility and bil SIJ; increased curvature in lower thoracic spine and sacrum;  bil lumbar paraspinal restriction/scar tissue restriction; R pelvic rotation ?  ?  ?COGNITION: ?           Overall cognitive status: Within functional limits for tasks assessed              ?            ?  ?POSTURE:  ?Posterior pelvic tilt, forward head/rounded shoulders ?  ?PALPATION: ?Internal Pelvic Floor Mild increase in levator ani tenderness, Rt>Lt; some bladder restriction ?  ?External Perineal Exam some palor, otherwise WNL ?  ?GENERAL DRA 1 finger width separation at umbilicus; tenderness in upper Rt quadrant; tension/restriction bil lower quadrant without tenderness ?  ?  ?PELVIC MMT: ?  ?MMT   ?09/11/2021  ?Vaginal 2/5, 3 second hold, 10 repetitions  ?Internal Anal Sphincter    ?External Anal Sphincter    ?Puborectalis    ?Diastasis Recti 1 finger width separation  ?(Blank rows = not tested) ?  ?  ?TONE: ?low ?  ?PROLAPSE: ?WNL ?  ?TREATMENT 10/15/21: ?Manual: ?Soft tissue mobilization: ?Abdominal and bil lumbar mobilization ?Scar tissue mobilization: ?Myofascial release: ?Spinal mobilization: ?Internal pelvic floor techniques: ?Dry needling: ?Neuromuscular re-education: ?Core retraining:  ?Core facilitation: ?Deadbug 2 x 10 ?Pallof press 10x bil ?Form correction: ?Pelvic floor contraction training: ?Down training: ?Exercises: ?Stretches/mobility: ?Butterfly stretch 2 x 60 sec ?Seated thoracic openers 10x bil ?Strengthening: ?Sidestepping yellow band 2 laps ?Therapeutic activities: ?Functional strengthening activities: ?Self-care: ? ? ? ?TREATMENT 10/03/21:  ?Manual: ?Soft tissue mobilization: ?Scar tissue  mobilization: ?Myofascial release: ?Spinal mobilization: ?Internal pelvic floor techniques: ?Dry needling: ?Neuromuscular re-education: ?Core retraining: 5 minutes with multimodal cues ?Core facilitation: ?Supine march 2 x 10

## 2021-10-15 NOTE — Telephone Encounter (Signed)
?  Patient ?Name: ?Caitlin CO ?OK ?Gender: Female ?DOB: May 21, 1986 ?Age: 36 Y 62 M 8 D ?Return ?Phone ?Number: ?[id number] ?(Primary) ?Address: ?City/ ?State/ ?Zip: ?[place] ? 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 SX cough, green phlegm, ?threw up, sore throat, cough. ?Translation No ?Nurse Assessment ?Nurse: Zenia Resides, RN, Diane Date/Time Eilene Ghazi Time): 10/13/2021 11:43:06 AM ?Confirm and document reason for call. If ?symptomatic, describe symptoms. ?---Caller states she has a cough with green phlegm, ?sore throat and hoarseness. Symptoms started last ?Sunday. No fever. Was seen on Wednesday. COVID ?negative, flu and strep negative. No sob. ?Does the patient have any new or worsening ?symptoms? ---Yes ?Will a triage be completed? ---Yes ?Related visit to physician within the last 2 weeks? ---Yes ?Does the PT have any chronic conditions? (i.e. ?diabetes, asthma, this includes High risk factors for ?pregnancy, etc.) ?---Yes ?List chronic conditions. ---pancreas issues ?Is the patient pregnant or possibly pregnant? (Ask ?all females between the ages of 9-55) ---No ?Is this a behavioral health or substance abuse call? ---No ?Guidelines ?Guideline Title Affirmed Question Affirmed Notes Nurse Date/Time (Eastern ?Time) ?Cough - Acute ?Productive ?SEVERE coughing ?spells (e.g., whooping ?sound after coughing, ?Zenia Resides, RN, Diane 10/13/2021 11:45:23 ?AM ?Guidelines ?Guideline Title Affirmed Question Affirmed Notes Nurse Date/Time (Eastern ?Time) ?vomiting  after ?coughing) ?Disp. Time (Eastern ?Time) Disposition Final User ?10/13/2021 11:49:21 AM See PCP within 24 Hours Yes Zenia Resides, RN, Diane ?Caller Disagree/Comply Comply ?Caller Understands Yes ?PreDisposition Call Doctor ?Care Advice Given Per Guideline ?SEE PCP WITHIN 24 HOURS: * IF OFFICE WILL BE CLOSED: You need to be seen within the next 24 hours. A clinic or ?an urgent care center is often a good source of care if your doctor's office is closed or you can't get an appointment. COUGHING ?SPELLS: * Drink warm fluids. Inhale warm mist. This can help relax the airway and also loosen up phlegm. * HOME REMEDY ?- HONEY: This old home remedy has been shown to help decrease coughing at night. The adult dosage is 2 teaspoons (10 ml) at ?bedtime. COUGH MEDICINES: * If the air is dry, use a humidifier in the bedroom. HUMIDIFIER: CALL BACK IF: * Difficulty ?breathing occurs * You become worse CARE ADVICE given per Cough - Acute Productive (Adult) guideline. ?Referrals ?[facility] ?

## 2021-10-15 NOTE — Telephone Encounter (Signed)
Pt is wanting to come back in due to not getting better. She is scheduled on 4/18 at 8:30 with [place] ?

## 2021-10-16 ENCOUNTER — Ambulatory Visit: Payer: 59 | Admitting: Physician Assistant

## 2021-10-16 ENCOUNTER — Ambulatory Visit: Payer: 59 | Admitting: Family Medicine

## 2021-10-17 ENCOUNTER — Encounter: Payer: Self-pay | Admitting: Family Medicine

## 2021-10-17 ENCOUNTER — Ambulatory Visit: Payer: 59 | Admitting: Family Medicine

## 2021-10-17 VITALS — BP 134/90 | HR 94 | Temp 98.4°F | Ht 68.0 in | Wt 129.8 lb

## 2021-10-17 DIAGNOSIS — J069 Acute upper respiratory infection, unspecified: Secondary | ICD-10-CM

## 2021-10-17 DIAGNOSIS — D509 Iron deficiency anemia, unspecified: Secondary | ICD-10-CM | POA: Insufficient documentation

## 2021-10-17 DIAGNOSIS — R053 Chronic cough: Secondary | ICD-10-CM

## 2021-10-17 DIAGNOSIS — K8681 Exocrine pancreatic insufficiency: Secondary | ICD-10-CM | POA: Diagnosis not present

## 2021-10-17 DIAGNOSIS — M961 Postlaminectomy syndrome, not elsewhere classified: Secondary | ICD-10-CM | POA: Insufficient documentation

## 2021-10-17 DIAGNOSIS — M47816 Spondylosis without myelopathy or radiculopathy, lumbar region: Secondary | ICD-10-CM | POA: Insufficient documentation

## 2021-10-17 MED ORDER — PREDNISONE 10 MG PO TABS: ORAL_TABLET | ORAL | 0 refills | Status: DC

## 2021-10-17 MED ORDER — BENZONATATE 200 MG PO CAPS: 200.0000 mg | ORAL_CAPSULE | Freq: Two times a day (BID) | ORAL | 0 refills | Status: DC | PRN

## 2021-10-17 MED ORDER — GUAIFENESIN-CODEINE 100-10 MG/5ML PO SOLN: 5.0000 mL | Freq: Every evening | ORAL | 0 refills | Status: DC | PRN

## 2021-10-17 NOTE — Patient Instructions (Signed)
Please return for your annual complete physical; please come fasting.    If you have any questions or concerns, please don't hesitate to send me a message via MyChart or call the office at [phone]. Thank you for visiting with us today! It's our pleasure caring for you.  

## 2021-10-17 NOTE — Progress Notes (Signed)
? ? ?Subjective  ?CC:  ?Chief Complaint  ?Patient presents with  ? Sore Throat  ?  Pt stated that she is not getting any better. She saw Alyssa on 10/10/2021. She still have a sore throat and a cough.  ? ? ?HPI: Rebecca Holt is a 36 y.o. female who presents to the office today to address the problems listed above in the chief complaint. ?36 year old have not seen for 2 years, however she has been here on multiple occasions over the last year for acute care visits.  Most recently she was seen for sore throat and cough.  I reviewed that note.  Her main complaint is persistent hacking cough.  Keeping her awake nights.  Chest is sore.  No fevers or chills or shortness of breath.  Her sore throat has improved although it remains sore due to the hacking cough.  She has no productive phlegm.  No GI symptoms.  She had negative COVID testing negative flu testing and negative strep culture.  An acute monotest was done but at day 3 of illness. ?Of note, she was diagnosed with pancreatic insufficiency 2 years ago. ?Assessment  ?1. Viral URI with cough   ?2. Persistent cough   ? ?  ?Plan  ?Persistent cough most likely due to viral URI: Doubt mono.  Reassured.  We will treat with prednisone, Tessalon Perles and Robitussin-AC.  Cough is likely postinflammatory.  No signs or symptoms of infection at this time.  We will follow-up in 2 weeks if not improved. ? ?Follow up: No follow-ups on file.  ?Visit date not found ? ?No orders of the defined types were placed in this encounter. ? ?Meds ordered this encounter  ?Medications  ? predniSONE (DELTASONE) 10 MG tablet  ?  Sig: Take 4 tabs qd x 2 days, 3 qd x 2 days, 2 qd x 2d, 1qd x 3 days  ?  Dispense:  21 tablet  ?  Refill:  0  ? benzonatate (TESSALON) 200 MG capsule  ?  Sig: Take 1 capsule (200 mg total) by mouth 2 (two) times daily as needed for cough.  ?  Dispense:  20 capsule  ?  Refill:  0  ? guaiFENesin-codeine 100-10 MG/5ML syrup  ?  Sig: Take 5 mLs by mouth at bedtime as needed  for cough.  ?  Dispense:  120 mL  ?  Refill:  0  ? ?  ? ?I reviewed the patients updated PMH, FH, and SocHx.  ?  ?Patient Active Problem List  ? Diagnosis Date Noted  ? Panic attacks 03/20/2020  ?  Priority: High  ? GAD (generalized anxiety disorder) 03/10/2019  ?  Priority: High  ? OCD (obsessive compulsive disorder) 03/10/2019  ?  Priority: High  ? Major depression, recurrent, chronic ([place]) 02/08/2019  ?  Priority: High  ? Acquired hypothyroidism 08/02/2010  ?  Priority: High  ? 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  Priority: Medium   ? Lumbar foraminal stenosis 05/07/2019  ?  Priority: Medium   ? Chronic tonsillitis 11/16/2018  ?  Priority: Medium   ? Persistent cough 06/07/2021  ? Antibiotic-induced yeast infection 06/07/2021  ? S/P laparoscopic assisted vaginal hysterectomy (LAVH) 04/23/2021  ? Status post lumbar spinal fusion 03/20/2020  ? ?Current Meds  ?Medication Sig  ? ALPRAZolam (XANAX) 0.5 MG tablet TAKE ONE TABLET BY MOUTH TWO TIMES A DAY AS NEEDED FOR ANXIETY  ? B Complex-C (B-COMPLEX WITH VITAMIN C) tablet Take 1 tablet by mouth  daily.  ? benzonatate (TESSALON) 200 MG capsule Take 1 capsule (200 mg total) by mouth 2 (two) times daily as needed for cough.  ? buPROPion (WELLBUTRIN XL) 150 MG 24 hr tablet Take 1 tablet (150 mg total) by mouth daily.  ? cholecalciferol (VITAMIN D3) 25 MCG (1000 UNIT) tablet Take 1,000 Units by mouth daily.  ? Erenumab-aooe (AIMOVIG) 140 MG/ML SOAJ Inject 140 mg into the skin every 28 (twenty-eight) days.  ? guaiFENesin-codeine 100-10 MG/5ML syrup Take 5 mLs by mouth at bedtime as needed for cough.  ? lamoTRIgine (LAMICTAL) 25 MG tablet Take 3 tablets (75 mg total) by mouth daily.  ? LYSINE PO Take 3 tablets by mouth in the morning.  ? magic mouthwash w/lidocaine SOLN Take 5 mLs by mouth 3 (three) times daily as needed for up to 10 days for mouth pain.  ? modafinil (PROVIGIL) 200 MG tablet Take 1 tablet (200 mg total) by mouth daily.  ? ondansetron (ZOFRAN-ODT) 4 MG  disintegrating tablet Take 4 mg by mouth every 8 (eight) hours as needed for vomiting or nausea.  ? predniSONE (DELTASONE) 10 MG tablet Take 4 tabs qd x 2 days, 3 qd x 2 days, 2 qd x 2d, 1qd x 3 days  ? rizatriptan (MAXALT-MLT) 10 MG disintegrating tablet Take 1 tablet (10 mg total) by mouth as needed for migraine (may repeat after 2 hours.  Maximum 2 tablets in 24 hours.). May repeat in 2 hours if needed  ? ZENPEP 40000-126000 units CPEP Take 1-2 capsules by mouth See admin instructions. Take 1 capsule by mouth with each snack & take 2 capsules by mouth with each meal  ? ? ?Allergies: ?Patient is allergic to tramadol and latex. ?Family History: ?Patient family history includes Bipolar disorder in her mother and son; Cancer in her maternal grandfather and paternal grandfather; Diabetes in her father and paternal grandfather; Diabetic kidney disease in her father; Hyperlipidemia in her father; Mental illness in her mother; Thyroid cancer in her maternal grandmother. ?Social History:  ?Patient  reports that she has never smoked. She has never used smokeless tobacco. She reports that she does not drink alcohol and does not use drugs. ? ?Review of Systems: ?Constitutional: Negative for fever malaise or anorexia ?Cardiovascular: negative for chest pain ?Respiratory: negative for SOB or persistent cough ?Gastrointestinal: negative for abdominal pain ? ?Objective  ?Vitals: BP 134/90   Pulse 94   Temp 98.4 ?F (36.9 ?C)   Ht '5\' 8"'$  (1.727 m)   Wt 129 lb 12.8 oz (58.9 kg)   LMP 05/09/2021 (Approximate)   SpO2 97%   BMI 19.74 kg/m?  ?General: no acute distress , A&Ox3 ?HEENT: PEERL, conjunctiva normal, neck is supple ?Cardiovascular:  RRR without murmur or gallop.  ?Respiratory:  Good breath sounds bilaterally, CTAB with normal respiratory effort ?Skin:  Warm, no rashes ? ? ? ?Commons side effects, risks, benefits, and alternatives for medications and treatment plan prescribed today were discussed, and the patient  expressed understanding of the given instructions. Patient is instructed to call or message via MyChart if he/she has any questions or concerns regarding our treatment plan. No barriers to understanding were identified. We discussed Red Flag symptoms and signs in detail. Patient expressed understanding regarding what to do in case of urgent or emergency type symptoms.  ?Medication list was reconciled, printed and provided to the patient in AVS. Patient instructions and summary information was reviewed with the patient as documented in the AVS. ?This note was prepared with assistance of  [employer] training and education officer. Occasional wrong-word or sound-a-like substitutions may have occurred due to the inherent limitations of voice recognition software ? ?This visit occurred during the SARS-CoV-2 public health emergency.  Safety protocols were in place, including screening questions prior to the visit, additional usage of staff PPE, and extensive cleaning of exam room while observing appropriate contact time as indicated for disinfecting solutions.  ? ?

## 2021-10-22 ENCOUNTER — Ambulatory Visit: Payer: 59

## 2021-10-22 DIAGNOSIS — M6281 Muscle weakness (generalized): Secondary | ICD-10-CM

## 2021-10-22 DIAGNOSIS — R279 Unspecified lack of coordination: Secondary | ICD-10-CM

## 2021-10-22 NOTE — Therapy (Signed)
?OUTPATIENT PHYSICAL THERAPY TREATMENT NOTE ? ? ?Patient Name: Rebecca Holt ?MRN: [id number] ?DOB:04-Nov-1985, 36 y.o., female ?Today's Date: 10/22/2021 ? ?PCP: Leamon Arnt, MD ?REFERRING PROVIDER: Paula Compton, MD ? ? PT End of Session - 10/22/21 0847   ? ? Visit Number 6   ? Date for PT Re-Evaluation 12/04/21   ? Authorization Type [employer]et[employer]   ? PT Start Time [phone]   ? PT Stop Time 0925   ? PT Time Calculation (min) 38 min   ? Activity Tolerance Patient tolerated treatment well   ? Behavior During Therapy [facility] for tasks assessed/performed   ? ?  ?  ? ?  ? ? ?Past Medical History:  ?Diagnosis Date  ? Anxiety   ? Arthritis   ? si joints  ? Bipolar disorder ([place])   ? Chicken pox   ? as child  ? Depressive disorder 02/08/2019  ? History of COVID-19 09/09/2020  ? g i issues x 2-3 weeks all symptoms reolved  ? History of hypothyroidism   ? yrs ago per pt on 04-17-2021  ? Pneumonia   ? yrs ago per pt on 04-17-2021  ? Wears glasses   ? ?Past Surgical History:  ?Procedure Laterality Date  ? ANTERIOR AND POSTERIOR REPAIR N/A 04/23/2021  ? Procedure: POSTERIOR REPAIR (RECTOCELE);  Surgeon: Paula Compton, MD;  Location: [facility];  Service: Gynecology;  Laterality: N/A;  ? BIOPSY  07/25/2021  ? Procedure: BIOPSY;  Surgeon: [name], MD;  Location: [facility];  Service: Gastroenterology;;  ? COLONOSCOPY WITH PROPOFOL N/A 07/25/2021  ? Procedure: COLONOSCOPY WITH PROPOFOL;  Surgeon: [name], MD;  Location: [facility];  Service: Gastroenterology;  Laterality: N/A;  ? CYSTOSCOPY N/A 04/23/2021  ? Procedure: CYSTOSCOPY;  Surgeon: Paula Compton, MD;  Location: [facility];  Service: Gynecology;  Laterality: N/A;  ? ESOPHAGOGASTRODUODENOSCOPY (EGD) WITH PROPOFOL N/A 07/25/2021  ? Procedure: ESOPHAGOGASTRODUODENOSCOPY (EGD) WITH PROPOFOL;  Surgeon: [name], MD;  Location: [facility];  Service: Gastroenterology;  Laterality: N/A;  ? LAPAROSCOPIC VAGINAL HYSTERECTOMY WITH  SALPINGECTOMY Bilateral 04/23/2021  ? Procedure: LAPAROSCOPIC ASSISTED VAGINAL HYSTERECTOMY WITH BILATERAL  SALPINGECTOMY;  Surgeon: Paula Compton, MD;  Location: [place];  Service: Gynecology;  Laterality: Bilateral;  ? TONSILLECTOMY Bilateral 2020  ? TRANSFORAMINAL LUMBAR INTERBODY FUSION (TLIF) WITH PEDICLE SCREW FIXATION 1 LEVEL N/A 05/07/2019  ? Procedure: Lumbar Five- Sacral One Transforaminal lumbar interbody Fusion;  Surgeon: Erline Levine, MD;  Location: [place];  Service: Neurosurgery;  Laterality: N/A;  Lumbar 5 Sacral 1 Transforaminal lumbar interbody fusion  ? ?Patient Active Problem List  ? Diagnosis Date Noted  ? Iron deficiency anemia 10/17/2021  ? Lumbar post-laminectomy syndrome 10/17/2021  ? Lumbar spondylosis 10/17/2021  ? S/P laparoscopic assisted vaginal hysterectomy (LAVH) 04/23/2021  ? Dyspareunia in female 04/11/2021  ? Dysmenorrhea 04/11/2021  ? Exocrine pancreatic insufficiency 03/23/2021  ? Panic attacks 03/20/2020  ? Status post lumbar spinal fusion 03/20/2020  ? 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 Lumbar foraminal stenosis 05/07/2019  ? GAD (generalized anxiety disorder) 03/10/2019  ? OCD (obsessive compulsive disorder) 03/10/2019  ? Major depression, recurrent, chronic ([place]) 02/08/2019  ? ? ?REFERRING DIAG: R39.14 (ICD-10-CM) - Feeling of incomplete bladder emptying ? ?THERAPY DIAG:  ?Muscle weakness (generalized) ? ?Unspecified lack of coordination ? ?PERTINENT HISTORY: 3 vaginal deliveries, hysterectomy and rectocele repair 04/23/2022; lumbar spinal fusion (L5/S1) 2020 ? ?PRECAUTIONS: NA ? ?SUBJECTIVE: Pt states that she continues to feel better and the knack has been  helpful. She feels like she has been very consistent with exercises, but has been noticing more and more how weak her hips are. She states that she does feel ready to D/C next treatment session.  ? ?PAIN:  ?Are you having pain? No ? ?SUBJECTIVE3/14/2023:                                                                                                                                                                                           ?  ?SUBJECTIVE STATEMENT: ?Pt states that she had hysterectomy in October due to heavy, painful menstrual cycles/pain with intercourse in which they also repaired rectocele. She  feels like recovery from surgery has gone well. She does report SUI. She states that intercourse got better after hysterectomy, but last week started to become painful again. She also feels like she has had an increase in vaginal dryness, but MD checked hormones and they are all fine (partial hysterectomy). ?Fluid intake: Yes: 32-48 oz of water; several cups of coffee in the morning   ?  ?Patient confirms identification and approves PT to assess pelvic floor and treatment Yes ?  ?  ?BOWEL MOVEMENT ?Pain with bowel movement: No ?Type of bowel movement:Frequency some days she can't go and then other days she goes many times due to pancreatic insufficiency and Strain No ?Fully empty rectum: No ?Leakage: No ?Pads: No ?Fiber supplement: No ?  ?URINATION ?Pain with urination: No ?Fully empty bladder: Yes: feels like she has to go often and right after she has gone; no post-void dribbling ?Stream: Strong ?Urgency: Yes: occasionally  ?Frequency: every 30 minutes; 2x/night ?Leakage: Coughing, Sneezing, and Laughing ?Pads: No ?  ?INTERCOURSE ?Pain with intercourse: During Penetration - different pain from before her hysterectomy which was stabbing pain; no she feels dryness/fullness (dry tampon) ?Ability to have vaginal penetration:  Yes: but uncomfortable ?Types of stimulation: - ?Climax: Yes: no pain ?Marinoff Scale  ?  ?PREGNANCY ?Vaginal deliveries 3 ?Tearing Yes: 1st delivery, minor ?C-section deliveries 0 ?Currently pregnant No ?  ?  ?PRECAUTIONS: None ?  ?WEIGHT BEARING RESTRICTIONS No ?  ?FALLS:  ?Has patient fallen in last 6 months? No, Number of falls: 0 ?  ?LIVING ENVIRONMENT: ?Lives with: lives  with their family ?Lives in: House/apartment ?  ?OCCUPATION: no ?  ?PLOF: Independent ?  ?PATIENT GOALS leak less often ?  ?  ?OBJECTIVE 09/24/21: Decreased lower thoracic mobility and bil SIJ; increased curvature in lower thoracic spine and sacrum; bil lumbar paraspinal restriction/scar tissue restriction; R pelvic rotation ?  ?  ?COGNITION: ?           Overall cognitive  status: Within functional limits for tasks assessed              ?            ?  ?POSTURE:  ?Posterior pelvic tilt, forward head/rounded shoulders ?  ?PALPATION: ?Internal Pelvic Floor Mild increase in levator ani tenderness, Rt>Lt; some bladder restriction ?  ?External Perineal Exam some palor, otherwise WNL ?  ?GENERAL DRA 1 finger width separation at umbilicus; tenderness in upper Rt quadrant; tension/restriction bil lower quadrant without tenderness ?  ?  ?PELVIC MMT: ?  ?MMT   ?09/11/2021  ?Vaginal 2/5, 3 second hold, 10 repetitions  ?Internal Anal Sphincter    ?External Anal Sphincter    ?Puborectalis    ?Diastasis Recti 1 finger width separation  ?(Blank rows = not tested) ?  ?  ?TONE: ?low ?  ?PROLAPSE: ?WNL ?  ?TODAY'S TREATMENT 10/22/21: ?Manual: ?Soft tissue mobilization: ?Scar tissue mobilization: ?Myofascial release: ?Spinal mobilization: ?Internal pelvic floor techniques: ?Dry needling: ?Neuromuscular re-education: ?Core retraining:  ?Core facilitation: ?Bear crawl holds 5 x 10 sec ?D2 kneeling chop 2 x 10 bil ?Side plank clam shells 2 x 10 bil ?Candle stick dip 10x bil ?Form correction: ?Pelvic floor contraction training: ?Down training: ?Exercises: ?Stretches/mobility: ?Cat cow 2 x 10 ?Child's pose lateral pull ?Wagging dog 2 x 10 ?Strengthening: ?Therapeutic activities: ?Functional strengthening activities: ?Single leg RDLs 10x bil ?Single leg hip rotation 10x bil ?Divers 10x bil ?Self-care: ? ? ?TREATMENT 10/15/21: ?Manual: ?Soft tissue mobilization: ?Abdominal and bil lumbar mobilization ?Scar tissue mobilization: ?Myofascial  release: ?Spinal mobilization: ?Internal pelvic floor techniques: ?Dry needling: ?Neuromuscular re-education: ?Core retraining:  ?Core facilitation: ?Deadbug 2 x 10 ?Pallof press 10x bil ?Form correction: ?Pelvic floo

## 2021-10-24 ENCOUNTER — Other Ambulatory Visit: Payer: Self-pay | Admitting: *Deleted

## 2021-10-24 ENCOUNTER — Telehealth: Payer: Self-pay | Admitting: Family Medicine

## 2021-10-24 DIAGNOSIS — J029 Acute pharyngitis, unspecified: Secondary | ICD-10-CM

## 2021-10-24 DIAGNOSIS — M542 Cervicalgia: Secondary | ICD-10-CM

## 2021-10-24 NOTE — Telephone Encounter (Signed)
Referral placed for ENT

## 2021-10-24 NOTE — Telephone Encounter (Signed)
Patient stated she is still not feeling better - sore throat and neck pain radiating to ears - patient would like recommendations.-  ?

## 2021-10-24 NOTE — Telephone Encounter (Signed)
Please advise 

## 2021-10-24 NOTE — Telephone Encounter (Signed)
Patient notified and verbalized understanding. Patient stated that it has already been 3 weeks, have taken all the medications prescribed and she isn't getting any better. Patient requesting to be seen by Christell Constant, MD at [facility] ?[address] ? ?[address] ? ?[place], [place] [address] ? ?Okay to place referral? ?

## 2021-10-25 MED ORDER — AZITHROMYCIN 250 MG PO TABS: ORAL_TABLET | ORAL | 0 refills | Status: DC

## 2021-10-25 NOTE — Telephone Encounter (Signed)
Please notify pt: I have ordered a zpak, an antibiotic for her.  ?

## 2021-10-25 NOTE — Telephone Encounter (Signed)
Pt states she is planning to go to the ENT as referral shows. ? ?Pt requests an antibiotic because pt states she is miserable with a raw throat, and she has not had any since this began. ?

## 2021-10-25 NOTE — Addendum Note (Signed)
Addended by: Billey Chang on: 10/25/2021 10:55 AM ? ? Modules accepted: Orders ? ?

## 2021-10-25 NOTE — Telephone Encounter (Signed)
Message sent thru [facility] notifying pt that an Rx was sent to pharmacy ?

## 2021-10-29 ENCOUNTER — Ambulatory Visit: Payer: 59 | Attending: Obstetrics and Gynecology

## 2021-10-29 DIAGNOSIS — R279 Unspecified lack of coordination: Secondary | ICD-10-CM | POA: Insufficient documentation

## 2021-10-29 DIAGNOSIS — M6281 Muscle weakness (generalized): Secondary | ICD-10-CM | POA: Diagnosis present

## 2021-10-29 NOTE — Therapy (Signed)
?OUTPATIENT PHYSICAL THERAPY TREATMENT NOTE ? ? ?Patient Name: Rebecca Holt ?MRN: [id number] ?DOB:[birth year], 36 y.o., female ?Today's Date: 10/29/2021 ? ?PCP: Leamon Arnt, MD ?REFERRING PROVIDER: Paula Compton, MD ? ? PT End of Session - 10/29/21 0932   ? ? Visit Number 7   ? Date for PT Re-Evaluation 12/04/21   ? Authorization Type Aetna   ? PT Start Time 0930   ? PT Stop Time 1010   ? PT Time Calculation (min) 40 min   ? Activity Tolerance Patient tolerated treatment well   ? Behavior During Therapy [facility] for tasks assessed/performed   ? ?  ?  ? ?  ? ? ?Past Medical History:  ?Diagnosis Date  ? Anxiety   ? Arthritis   ? si joints  ? Bipolar disorder ([place])   ? Chicken pox   ? as child  ? Depressive disorder 02/08/2019  ? History of COVID-19 09/09/2020  ? g i issues x 2-3 weeks all symptoms reolved  ? History of hypothyroidism   ? yrs ago per pt on 04-17-2021  ? Pneumonia   ? yrs ago per pt on 04-17-2021  ? Wears glasses   ? ?Past Surgical History:  ?Procedure Laterality Date  ? ANTERIOR AND POSTERIOR REPAIR N/A 04/23/2021  ? Procedure: POSTERIOR REPAIR (RECTOCELE);  Surgeon: Paula Compton, MD;  Location: [facility];  Service: Gynecology;  Laterality: N/A;  ? BIOPSY  07/25/2021  ? Procedure: BIOPSY;  Surgeon: [name], MD;  Location: [facility];  Service: Gastroenterology;;  ? COLONOSCOPY WITH PROPOFOL N/A 07/25/2021  ? Procedure: COLONOSCOPY WITH PROPOFOL;  Surgeon: [name], MD;  Location: [facility];  Service: Gastroenterology;  Laterality: N/A;  ? CYSTOSCOPY N/A 04/23/2021  ? Procedure: CYSTOSCOPY;  Surgeon: Paula Compton, MD;  Location: [facility];  Service: Gynecology;  Laterality: N/A;  ? ESOPHAGOGASTRODUODENOSCOPY (EGD) WITH PROPOFOL N/A 07/25/2021  ? Procedure: ESOPHAGOGASTRODUODENOSCOPY (EGD) WITH PROPOFOL;  Surgeon: [name], MD;  Location: [facility];  Service: Gastroenterology;  Laterality: N/A;  ? LAPAROSCOPIC VAGINAL HYSTERECTOMY WITH  SALPINGECTOMY Bilateral 04/23/2021  ? Procedure: LAPAROSCOPIC ASSISTED VAGINAL HYSTERECTOMY WITH BILATERAL  SALPINGECTOMY;  Surgeon: Paula Compton, MD;  Location: [place];  Service: Gynecology;  Laterality: Bilateral;  ? TONSILLECTOMY Bilateral 2020  ? TRANSFORAMINAL LUMBAR INTERBODY FUSION (TLIF) WITH PEDICLE SCREW FIXATION 1 LEVEL N/A 05/07/2019  ? Procedure: Lumbar Five- Sacral One Transforaminal lumbar interbody Fusion;  Surgeon: Erline Levine, MD;  Location: [place];  Service: Neurosurgery;  Laterality: N/A;  Lumbar 5 Sacral 1 Transforaminal lumbar interbody fusion  ? ?Patient Active Problem List  ? Diagnosis Date Noted  ? Iron deficiency anemia 10/17/2021  ? Lumbar post-laminectomy syndrome 10/17/2021  ? Lumbar spondylosis 10/17/2021  ? S/P laparoscopic assisted vaginal hysterectomy (LAVH) 04/23/2021  ? Dyspareunia in female 04/11/2021  ? Dysmenorrhea 04/11/2021  ? Exocrine pancreatic insufficiency 03/23/2021  ? Panic attacks 03/20/2020  ? Status post lumbar spinal fusion 03/20/2020  ? 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 Lumbar foraminal stenosis 05/07/2019  ? GAD (generalized anxiety disorder) 03/10/2019  ? OCD (obsessive compulsive disorder) 03/10/2019  ? Major depression, recurrent, chronic ([place]) 02/08/2019  ? ? ?REFERRING DIAG: R39.14 (ICD-10-CM) - Feeling of incomplete bladder emptying ? ?THERAPY DIAG:  ?Muscle weakness (generalized) ? ?Unspecified lack of coordination ? ?PERTINENT HISTORY: 3 vaginal deliveries, hysterectomy and rectocele repair 04/23/2022; lumbar spinal fusion (L5/S1) 2020 ? ?PRECAUTIONS: NA ? ?SUBJECTIVE: Pt states urinary issues are much better and she feels like she has  control over her bladder/urgency. She states that intercourse with husband is usually not painful, but with very deep thrusting she does still have some discomfort. She feels like this may be getting worse again and feels like when things were getting worse prior to hysterectomy.  ? ?PAIN:   ?Are you having pain? No ? ?SUBJECTIVE3/14/2023:                                                                                                                                                                                          ?  ?SUBJECTIVE STATEMENT: ?Pt states that she had hysterectomy in October due to heavy, painful menstrual cycles/pain with intercourse in which they also repaired rectocele. She  feels like recovery from surgery has gone well. She does report SUI. She states that intercourse got better after hysterectomy, but last week started to become painful again. She also feels like she has had an increase in vaginal dryness, but MD checked hormones and they are all fine (partial hysterectomy). ?Fluid intake: Yes: 32-48 oz of water; several cups of coffee in the morning   ?  ?Patient confirms identification and approves PT to assess pelvic floor and treatment Yes ?  ?  ?BOWEL MOVEMENT ?Pain with bowel movement: No ?Type of bowel movement:Frequency some days she can't go and then other days she goes many times due to pancreatic insufficiency and Strain No ?Fully empty rectum: No ?Leakage: No ?Pads: No ?Fiber supplement: No ?  ?URINATION ?Pain with urination: No ?Fully empty bladder: Yes: feels like she has to go often and right after she has gone; no post-void dribbling ?Stream: Strong ?Urgency: Yes: occasionally  ?Frequency: every 30 minutes; 2x/night ?Leakage: Coughing, Sneezing, and Laughing ?Pads: No ?  ?INTERCOURSE ?Pain with intercourse: During Penetration - different pain from before her hysterectomy which was stabbing pain; no she feels dryness/fullness (dry tampon) ?Ability to have vaginal penetration:  Yes: but uncomfortable ?Types of stimulation: - ?Climax: Yes: no pain ?Marinoff Scale  ?  ?PREGNANCY ?Vaginal deliveries 3 ?Tearing Yes: 1st delivery, minor ?C-section deliveries 0 ?Currently pregnant No ?  ?  ?PRECAUTIONS: None ?  ?WEIGHT BEARING RESTRICTIONS No ?  ?FALLS:  ?Has patient  fallen in last 6 months? No, Number of falls: 0 ?  ?LIVING ENVIRONMENT: ?Lives with: lives with their family ?Lives in: House/apartment ?  ?OCCUPATION: no ?  ?PLOF: Independent ?  ?PATIENT GOALS leak less often ?  ?  ?OBJECTIVE 10/29/21: pelvic floor strength 3/5, endurance 6 seconds; increased pelvic floor tension in deep Lt pelvic floor with tenderness that is descirbed as the same as pain with deep thrusting. ? ?09/24/21: Decreased  lower thoracic mobility and bil SIJ; increased curvature in lower thoracic spine and sacrum; bil lumbar paraspinal restriction/scar tissue restriction; R pelvic rotation ?  ?  ?COGNITION: ?           Overall cognitive status: Within functional limits for tasks assessed              ?            ?  ?POSTURE:  ?Posterior pelvic tilt, forward head/rounded shoulders ?  ?PALPATION: ?Internal Pelvic Floor Mild increase in levator ani tenderness, Rt>Lt; some bladder restriction ?  ?External Perineal Exam some palor, otherwise WNL ?  ?GENERAL DRA 1 finger width separation at umbilicus; tenderness in upper Rt quadrant; tension/restriction bil lower quadrant without tenderness ?  ?  ?PELVIC MMT: ?  ?MMT   ?09/11/2021  ?Vaginal 2/5, 3 second hold, 10 repetitions  ?Internal Anal Sphincter    ?External Anal Sphincter    ?Puborectalis    ?Diastasis Recti 1 finger width separation  ?(Blank rows = not tested) ?  ?  ?TONE: ?low ?  ?PROLAPSE: ?WNL ?  ?TODAY'S TREATMENT 10/29/21: ?Manual: ?Soft tissue mobilization: ?Abdominal, focus on Lt side ?Scar tissue mobilization: ?Myofascial release: ?Spinal mobilization: ?Internal pelvic floor techniques: ?No emotional/communication barriers or cognitive limitation. Patient is motivated to learn. Patient understands and agrees with treatment goals and plan. PT explains patient will be examined in standing, sitting, and lying down to see how their muscles and joints work. When they are ready, they will be asked to remove their underwear so PT can examine their perineum.  The patient is also given the option of providing their own chaperone as one is not provided in our facility. The patient also has the right and is explained the right to defer or refuse any part of the evaluation

## 2021-11-05 ENCOUNTER — Ambulatory Visit: Payer: 59

## 2021-11-05 DIAGNOSIS — M6281 Muscle weakness (generalized): Secondary | ICD-10-CM | POA: Diagnosis not present

## 2021-11-05 DIAGNOSIS — R279 Unspecified lack of coordination: Secondary | ICD-10-CM

## 2021-11-05 NOTE — Patient Instructions (Signed)

## 2021-11-05 NOTE — Therapy (Signed)
?OUTPATIENT PHYSICAL THERAPY TREATMENT NOTE ? ? ?Patient Name: Rebecca Holt ?MRN: [id number] ?DOB:1985-09-17, 36 y.o., female ?Today's Date: 11/05/2021 ? ?PCP: Leamon Arnt, MD ?REFERRING PROVIDER: Leamon Arnt, MD ? ? PT End of Session - 11/05/21 1010   ? ? Visit Number 8   ? Date for PT Re-Evaluation 12/04/21   ? Authorization Type [employer]   ? PT Start Time 1012   ? PT Stop Time 1052   ? PT Time Calculation (min) 40 min   ? Activity Tolerance Patient tolerated treatment well   ? Behavior During Therapy [facility] for tasks assessed/performed   ? ?  ?  ? ?  ? ? ?Past Medical History:  ?Diagnosis Date  ? Anxiety   ? Arthritis   ? si joints  ? Bipolar disorder ([place])   ? Chicken pox   ? as child  ? Depressive disorder 02/08/2019  ? History of COVID-19 09/09/2020  ? g i issues x 2-3 weeks all symptoms reolved  ? History of hypothyroidism   ? yrs ago per pt on 04-17-2021  ? Pneumonia   ? yrs ago per pt on 04-17-2021  ? Wears glasses   ? ?Past Surgical History:  ?Procedure Laterality Date  ? ANTERIOR AND POSTERIOR REPAIR N/A 04/23/2021  ? Procedure: POSTERIOR REPAIR (RECTOCELE);  Surgeon: Paula Compton, MD;  Location: [facility];  Service: Gynecology;  Laterality: N/A;  ? BIOPSY  07/25/2021  ? Procedure: BIOPSY;  Surgeon: [name], MD;  Location: [facility];  Service: Gastroenterology;;  ? COLONOSCOPY WITH PROPOFOL N/A 07/25/2021  ? Procedure: COLONOSCOPY WITH PROPOFOL;  Surgeon: [name], MD;  Location: [facility];  Service: Gastroenterology;  Laterality: N/A;  ? CYSTOSCOPY N/A 04/23/2021  ? Procedure: CYSTOSCOPY;  Surgeon: Paula Compton, MD;  Location: [facility];  Service: Gynecology;  Laterality: N/A;  ? ESOPHAGOGASTRODUODENOSCOPY (EGD) WITH PROPOFOL N/A 07/25/2021  ? Procedure: ESOPHAGOGASTRODUODENOSCOPY (EGD) WITH PROPOFOL;  Surgeon: [name], MD;  Location: [facility];  Service: Gastroenterology;  Laterality: N/A;  ? LAPAROSCOPIC VAGINAL HYSTERECTOMY WITH SALPINGECTOMY  Bilateral 04/23/2021  ? Procedure: LAPAROSCOPIC ASSISTED VAGINAL HYSTERECTOMY WITH BILATERAL  SALPINGECTOMY;  Surgeon: Paula Compton, MD;  Location: [place];  Service: Gynecology;  Laterality: Bilateral;  ? TONSILLECTOMY Bilateral 2020  ? TRANSFORAMINAL LUMBAR INTERBODY FUSION (TLIF) WITH PEDICLE SCREW FIXATION 1 LEVEL N/A 05/07/2019  ? Procedure: Lumbar Five- Sacral One Transforaminal lumbar interbody Fusion;  Surgeon: Erline Levine, MD;  Location: [place];  Service: Neurosurgery;  Laterality: N/A;  Lumbar 5 Sacral 1 Transforaminal lumbar interbody fusion  ? ?Patient Active Problem List  ? Diagnosis Date Noted  ? Iron deficiency anemia 10/17/2021  ? Lumbar post-laminectomy syndrome 10/17/2021  ? Lumbar spondylosis 10/17/2021  ? S/P laparoscopic assisted vaginal hysterectomy (LAVH) 04/23/2021  ? Dyspareunia in female 04/11/2021  ? Dysmenorrhea 04/11/2021  ? Exocrine pancreatic insufficiency 03/23/2021  ? Panic attacks 03/20/2020  ? Status post lumbar spinal fusion 03/20/2020  ? 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 Lumbar foraminal stenosis 05/07/2019  ? GAD (generalized anxiety disorder) 03/10/2019  ? OCD (obsessive compulsive disorder) 03/10/2019  ? Major depression, recurrent, chronic ([place]) 02/08/2019  ? ? ?REFERRING DIAG: R39.14 (ICD-10-CM) - Feeling of incomplete bladder emptying ? ?THERAPY DIAG:  ?Muscle weakness (generalized) ? ?Unspecified lack of coordination ? ?PERTINENT HISTORY: 3 vaginal deliveries, hysterectomy and rectocele repair 04/23/2022; lumbar spinal fusion (L5/S1) 2020 ? ?PRECAUTIONS: NA ? ?SUBJECTIVE: Pt states that she attempted to work with husband on pelvic floor  muscle release, but states that it did not make much of a difference. She did order pelvic floor muscle wand. She did have intercourse and feels like pain was the same. She is still working on strengthening and is having a hard time with hip hinges - her Rt hip is hurting her.  ? ? ?SUBJECTIVE3/14/2023:                                                                                                                                                                                           ?  ?SUBJECTIVE STATEMENT: ?Pt states that she had hysterectomy in October due to heavy, painful menstrual cycles/pain with intercourse in which they also repaired rectocele. She  feels like recovery from surgery has gone well. She does report SUI. She states that intercourse got better after hysterectomy, but last week started to become painful again. She also feels like she has had an increase in vaginal dryness, but MD checked hormones and they are all fine (partial hysterectomy). ?Fluid intake: Yes: 32-48 oz of water; several cups of coffee in the morning   ?  ?Patient confirms identification and approves PT to assess pelvic floor and treatment Yes ?  ?  ?BOWEL MOVEMENT ?Pain with bowel movement: No ?Type of bowel movement:Frequency some days she can't go and then other days she goes many times due to pancreatic insufficiency and Strain No ?Fully empty rectum: No ?Leakage: No ?Pads: No ?Fiber supplement: No ?  ?URINATION ?Pain with urination: No ?Fully empty bladder: Yes: feels like she has to go often and right after she has gone; no post-void dribbling ?Stream: Strong ?Urgency: Yes: occasionally  ?Frequency: every 30 minutes; 2x/night ?Leakage: Coughing, Sneezing, and Laughing ?Pads: No ?  ?INTERCOURSE ?Pain with intercourse: During Penetration - different pain from before her hysterectomy which was stabbing pain; no she feels dryness/fullness (dry tampon) ?Ability to have vaginal penetration:  Yes: but uncomfortable ?Types of stimulation: - ?Climax: Yes: no pain ?Marinoff Scale  ?  ?PREGNANCY ?Vaginal deliveries 3 ?Tearing Yes: 1st delivery, minor ?C-section deliveries 0 ?Currently pregnant No ?  ?  ?PRECAUTIONS: None ?  ?WEIGHT BEARING RESTRICTIONS No ?  ?FALLS:  ?Has patient fallen in last 6 months? No, Number of falls: 0 ?   ?LIVING ENVIRONMENT: ?Lives with: lives with their family ?Lives in: House/apartment ?  ?OCCUPATION: no ?  ?PLOF: Independent ?  ?PATIENT GOALS leak less often ?  ?  ?OBJECTIVE 10/29/21: pelvic floor strength 3/5, endurance 6 seconds; increased pelvic floor tension in deep Lt pelvic floor with tenderness that is descirbed as the same as pain with deep thrusting. ? ?09/24/21:  Decreased lower thoracic mobility and bil SIJ; increased curvature in lower thoracic spine and sacrum; bil lumbar paraspinal restriction/scar tissue restriction; R pelvic rotation ?  ?  ?COGNITION: ?           Overall cognitive status: Within functional limits for tasks assessed              ?            ?  ?POSTURE:  ?Posterior pelvic tilt, forward head/rounded shoulders ?  ?PALPATION: ?Internal Pelvic Floor Mild increase in levator ani tenderness, Rt>Lt; some bladder restriction ?  ?External Perineal Exam some palor, otherwise WNL ?  ?GENERAL DRA 1 finger width separation at umbilicus; tenderness in upper Rt quadrant; tension/restriction bil lower quadrant without tenderness ?  ?  ?PELVIC MMT: ?  ?MMT   ?09/11/2021  ?Vaginal 2/5, 3 second hold, 10 repetitions  ?Internal Anal Sphincter    ?External Anal Sphincter    ?Puborectalis    ?Diastasis Recti 1 finger width separation  ?(Blank rows = not tested) ?  ?  ?TONE: ?low ?  ?PROLAPSE: ?WNL ?  ?TODAY'S TREATMENT 11/05/21: ?Manual: ?Soft tissue mobilization: ?Rt glutes ?Scar tissue mobilization: ?Myofascial release: ?abdomen ?Spinal mobilization: ?Internal pelvic floor techniques: ?No emotional/communication barriers or cognitive limitation. Patient is motivated to learn. Patient understands and agrees with treatment goals and plan. PT explains patient will be examined in standing, sitting, and lying down to see how their muscles and joints work. When they are ready, they will be asked to remove their underwear so PT can examine their perineum. The patient is also given the option of providing their  own chaperone as one is not provided in our facility. The patient also has the right and is explained the right to defer or refuse any part of the evaluation or treatment including the internal exam. With th

## 2021-11-13 ENCOUNTER — Ambulatory Visit: Payer: 59

## 2021-11-13 ENCOUNTER — Ambulatory Visit: Payer: 59 | Admitting: Family Medicine

## 2021-11-13 VITALS — BP 110/60 | HR 79 | Temp 97.8°F | Ht 68.0 in | Wt 128.4 lb

## 2021-11-13 DIAGNOSIS — J9801 Acute bronchospasm: Secondary | ICD-10-CM

## 2021-11-13 DIAGNOSIS — J069 Acute upper respiratory infection, unspecified: Secondary | ICD-10-CM | POA: Diagnosis not present

## 2021-11-13 MED ORDER — DOXYCYCLINE HYCLATE 100 MG PO TABS: 100.0000 mg | ORAL_TABLET | Freq: Two times a day (BID) | ORAL | 0 refills | Status: AC

## 2021-11-13 MED ORDER — FLUCONAZOLE 150 MG PO TABS: ORAL_TABLET | ORAL | 0 refills | Status: DC

## 2021-11-13 NOTE — Patient Instructions (Signed)
Please return for your annual complete physical; please come fasting.    If you have any questions or concerns, please don't hesitate to send me a message via MyChart or call the office at [phone]. Thank you for visiting with us today! It's our pleasure caring for you.  

## 2021-11-13 NOTE — Progress Notes (Signed)
? ?Subjective  ?CC:  ?Chief Complaint  ?Patient presents with  ? Sore Throat  ?  Pt stated that she has had a sore throat since Saturday night along with body ache  ? ? ?HPI: Rebecca Holt is a 36 y.o. female who presents to the office today to address the problems listed above in the chief complaint. ?36 year old here, very frustrated.  Has another upper respiratory tract infection.  She reports she has had a long year of recurrent illnesses, iron deficiency anemia but is resolved weight loss due to pancreatic insufficiency.  She is tired and feels like she cannot keep up.  Current symptoms include sore throat, nasal congestion and hacking cough.  She reports low-grade fevers.  Body aches.  No nausea vomiting or diarrhea. ? ?Assessment  ?1. Viral URI with cough   ?2. Bronchospasm   ? ?  ?Plan  ?Likely viral URI but patient would like to try antibiotics: Education given.  Doxy and Diflucan prescribed.  Counseling done.  Monitor symptoms. ?Pt declines pred. Has albuterol at home.  ? ?Follow up:  cpe ?Visit date not found ? ?No orders of the defined types were placed in this encounter. ? ?Meds ordered this encounter  ?Medications  ? doxycycline (VIBRA-TABS) 100 MG tablet  ?  Sig: Take 1 tablet (100 mg total) by mouth 2 (two) times daily for 7 days.  ?  Dispense:  14 tablet  ?  Refill:  0  ? fluconazole (DIFLUCAN) 150 MG tablet  ?  Sig: Take one tablet today; may repeat in 3 days if symptoms persist  ?  Dispense:  2 tablet  ?  Refill:  0  ? ?  ? ?I reviewed the patients updated PMH, FH, and SocHx.  ?  ?Patient Active Problem List  ? Diagnosis Date Noted  ? Panic attacks 03/20/2020  ?  Priority: High  ? GAD (generalized anxiety disorder) 03/10/2019  ?  Priority: High  ? OCD (obsessive compulsive disorder) 03/10/2019  ?  Priority: High  ? Major depression, recurrent, chronic ([place]) 02/08/2019  ?  Priority: High  ? 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  Priority: Medium   ? Lumbar foraminal stenosis 05/07/2019  ?   Priority: Medium   ? Iron deficiency anemia 10/17/2021  ? Lumbar post-laminectomy syndrome 10/17/2021  ? Lumbar spondylosis 10/17/2021  ? S/P laparoscopic assisted vaginal hysterectomy (LAVH) 04/23/2021  ? Dyspareunia in female 04/11/2021  ? Dysmenorrhea 04/11/2021  ? Exocrine pancreatic insufficiency 03/23/2021  ? Status post lumbar spinal fusion 03/20/2020  ? ?Current Meds  ?Medication Sig  ? ALPRAZolam (XANAX) 0.5 MG tablet TAKE ONE TABLET BY MOUTH TWO TIMES A DAY AS NEEDED FOR ANXIETY  ? B Complex-C (B-COMPLEX WITH VITAMIN C) tablet Take 1 tablet by mouth daily.  ? buPROPion (WELLBUTRIN XL) 150 MG 24 hr tablet Take 1 tablet (150 mg total) by mouth daily.  ? cholecalciferol (VITAMIN D3) 25 MCG (1000 UNIT) tablet Take 1,000 Units by mouth daily.  ? doxycycline (VIBRA-TABS) 100 MG tablet Take 1 tablet (100 mg total) by mouth 2 (two) times daily for 7 days.  ? Erenumab-aooe (AIMOVIG) 140 MG/ML SOAJ Inject 140 mg into the skin every 28 (twenty-eight) days.  ? fluconazole (DIFLUCAN) 150 MG tablet Take one tablet today; may repeat in 3 days if symptoms persist  ? lamoTRIgine (LAMICTAL) 25 MG tablet Take 3 tablets (75 mg total) by mouth daily.  ? LYSINE PO Take 3 tablets by mouth in the morning.  ?  modafinil (PROVIGIL) 200 MG tablet Take 1 tablet (200 mg total) by mouth daily.  ? ondansetron (ZOFRAN-ODT) 4 MG disintegrating tablet Take 4 mg by mouth every 8 (eight) hours as needed for vomiting or nausea.  ? rizatriptan (MAXALT-MLT) 10 MG disintegrating tablet Take 1 tablet (10 mg total) by mouth as needed for migraine (may repeat after 2 hours.  Maximum 2 tablets in 24 hours.). May repeat in 2 hours if needed  ? ZENPEP 40000-126000 units CPEP Take 1-2 capsules by mouth See admin instructions. Take 1 capsule by mouth with each snack & take 2 capsules by mouth with each meal  ? ? ?Allergies: ?Patient is allergic to tramadol and latex. ?Family History: ?Patient family history includes Bipolar disorder in her mother and  son; Cancer in her maternal grandfather and paternal grandfather; Diabetes in her father and paternal grandfather; Diabetic kidney disease in her father; Hyperlipidemia in her father; Mental illness in her mother; Thyroid cancer in her maternal grandmother. ?Social History:  ?Patient  reports that she has never smoked. She has never used smokeless tobacco. She reports that she does not drink alcohol and does not use drugs. ? ?Review of Systems: ?Constitutional: Negative for fever malaise or anorexia ?Cardiovascular: negative for chest pain ?Respiratory: negative for SOB or persistent cough ?Gastrointestinal: negative for abdominal pain ? ?Objective  ?Vitals: BP 110/60   Pulse 79   Temp 97.8 ?F (36.6 ?C)   Ht '5\' 8"'$  (1.727 m)   Wt 128 lb 6.4 oz (58.2 kg)   LMP 05/09/2021 (Approximate)   SpO2 98%   BMI 19.52 kg/m?  ?General: no acute distress , A&Ox3, continually clears throat and inhales through the nose.  Forceful cough, trying to clear her throat. ?HEENT: PEERL, conjunctiva normal, neck is supple, posterior right pharynx with small ulceration, mildly erythematous, no exudate.  Minimal cervical lymphadenopathy ?Cardiovascular:  RRR without murmur or gallop.  ?Respiratory:  Good breath sounds bilaterally, CTAB with normal respiratory effort except occasional wheeze at right base.  She is ?Skin:  Warm, no rashes ? ? ? ?Commons side effects, risks, benefits, and alternatives for medications and treatment plan prescribed today were discussed, and the patient expressed understanding of the given instructions. Patient is instructed to call or message via MyChart if he/she has any questions or concerns regarding our treatment plan. No barriers to understanding were identified. We discussed Red Flag symptoms and signs in detail. Patient expressed understanding regarding what to do in case of urgent or emergency type symptoms.  ?Medication list was reconciled, printed and provided to the patient in AVS. Patient  instructions and summary information was reviewed with the patient as documented in the AVS. ?This note was prepared with assistance of Systems analyst. Occasional wrong-word or sound-a-like substitutions may have occurred due to the inherent limitations of voice recognition software ? ?This visit occurred during the SARS-CoV-2 public health emergency.  Safety protocols were in place, including screening questions prior to the visit, additional usage of staff PPE, and extensive cleaning of exam room while observing appropriate contact time as indicated for disinfecting solutions.  ? ?

## 2021-11-16 ENCOUNTER — Telehealth: Payer: Self-pay | Admitting: Family Medicine

## 2021-11-16 NOTE — Telephone Encounter (Signed)
Pt would like to switch from [facility], Dr. Billey Chang to [facility], Dr. Carolann Littler because [facility] is closer to her home.  Approve or Decline Transfer of Care

## 2021-11-26 ENCOUNTER — Other Ambulatory Visit: Payer: Self-pay | Admitting: Psychiatry

## 2021-11-26 DIAGNOSIS — F39 Unspecified mood [affective] disorder: Secondary | ICD-10-CM

## 2021-11-29 ENCOUNTER — Ambulatory Visit: Payer: 59 | Admitting: Neurology

## 2021-12-03 ENCOUNTER — Ambulatory Visit: Payer: 59

## 2021-12-04 ENCOUNTER — Ambulatory Visit: Payer: 59 | Attending: Obstetrics and Gynecology

## 2021-12-04 DIAGNOSIS — M6281 Muscle weakness (generalized): Secondary | ICD-10-CM | POA: Insufficient documentation

## 2021-12-04 DIAGNOSIS — R279 Unspecified lack of coordination: Secondary | ICD-10-CM | POA: Diagnosis present

## 2021-12-04 NOTE — Therapy (Signed)
OUTPATIENT PHYSICAL THERAPY TREATMENT NOTE   Patient Name: Rebecca Holt MRN: [id number] DOB:July 09, 1985, 36 y.o., female Today's Date: 12/04/2021  PCP: Leamon Arnt, MD REFERRING PROVIDER: Paula Compton, MD   PT End of Session - 12/04/21 0843     Visit Number 9    Date for PT Re-Evaluation 01/29/22    Authorization Type [employer]    PT Start Time 0845    PT Stop Time 0925    PT Time Calculation (min) 40 min    Activity Tolerance Patient tolerated treatment well    Behavior During Therapy [facility] for tasks assessed/performed              Past Medical History:  Diagnosis Date   Anxiety    Arthritis    si joints   Bipolar disorder ([place])    Chicken pox    as child   Depressive disorder 02/08/2019   History of COVID-19 09/09/2020   g i issues x 2-3 weeks all symptoms reolved   History of hypothyroidism    yrs ago per pt on 04-17-2021   Pneumonia    yrs ago per pt on 04-17-2021   Wears glasses    Past Surgical History:  Procedure Laterality Date   ANTERIOR AND POSTERIOR REPAIR N/A 04/23/2021   Procedure: POSTERIOR REPAIR (RECTOCELE);  Surgeon: Paula Compton, MD;  Location: [facility];  Service: Gynecology;  Laterality: N/A;   BIOPSY  07/25/2021   Procedure: BIOPSY;  Surgeon: [name], MD;  Location: [facility];  Service: Gastroenterology;;   COLONOSCOPY WITH PROPOFOL N/A 07/25/2021   Procedure: COLONOSCOPY WITH PROPOFOL;  Surgeon: [name], MD;  Location: [facility];  Service: Gastroenterology;  Laterality: N/A;   CYSTOSCOPY N/A 04/23/2021   Procedure: CYSTOSCOPY;  Surgeon: Paula Compton, MD;  Location: [facility];  Service: Gynecology;  Laterality: N/A;   ESOPHAGOGASTRODUODENOSCOPY (EGD) WITH PROPOFOL N/A 07/25/2021   Procedure: ESOPHAGOGASTRODUODENOSCOPY (EGD) WITH PROPOFOL;  Surgeon: [name], MD;  Location: [facility];  Service: Gastroenterology;  Laterality: N/A;   LAPAROSCOPIC VAGINAL HYSTERECTOMY WITH  SALPINGECTOMY Bilateral 04/23/2021   Procedure: LAPAROSCOPIC ASSISTED VAGINAL HYSTERECTOMY WITH BILATERAL  SALPINGECTOMY;  Surgeon: Paula Compton, MD;  Location: [place];  Service: Gynecology;  Laterality: Bilateral;   TONSILLECTOMY Bilateral 2020   TRANSFORAMINAL LUMBAR INTERBODY FUSION (TLIF) WITH PEDICLE SCREW FIXATION 1 LEVEL N/A 05/07/2019   Procedure: Lumbar Five- Sacral One Transforaminal lumbar interbody Fusion;  Surgeon: Erline Levine, MD;  Location: [place];  Service: Neurosurgery;  Laterality: N/A;  Lumbar 5 Sacral 1 Transforaminal lumbar interbody fusion   Patient Active Problem List   Diagnosis Date Noted   Iron deficiency anemia 10/17/2021   Lumbar post-laminectomy syndrome 10/17/2021   Lumbar spondylosis 10/17/2021   S/P laparoscopic assisted vaginal hysterectomy (LAVH) 04/23/2021   Dyspareunia in female 04/11/2021   Dysmenorrhea 04/11/2021   Exocrine pancreatic insufficiency 03/23/2021   Panic attacks 03/20/2020   Status post lumbar spinal fusion 03/20/2020   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   Lumbar foraminal stenosis 05/07/2019   GAD (generalized anxiety disorder) 03/10/2019   OCD (obsessive compulsive disorder) 03/10/2019   Major depression, recurrent, chronic ([place]) 02/08/2019    REFERRING DIAG: R39.14 (ICD-10-CM) - Feeling of incomplete bladder emptying  THERAPY DIAG:  Muscle weakness (generalized) - Plan: PT plan of care cert/re-cert  Unspecified lack of coordination - Plan: PT plan of care cert/re-cert  PERTINENT HISTORY: 3 vaginal deliveries, hysterectomy and rectocele repair 04/23/2022; lumbar spinal fusion (L5/S1) 2020  PRECAUTIONS: NA  SUBJECTIVE: Patient states that all of urinary dysfunction that brought her to PT is doing better, but she has been having a little bit more difficulty with painful intercourse. They have not been intimate very often, so she is unsure if it is getting better or working with the wand is helping.  She does states that she has not been as good about working on HEP and needs to get back to it. She has noticed more soreness in low back over the last several weeks not being able to perform exercises.   SUBJECTIVE3/14/2023:                                                                                                                                                                                            SUBJECTIVE STATEMENT: Pt states that she had hysterectomy in October due to heavy, painful menstrual cycles/pain with intercourse in which they also repaired rectocele. She  feels like recovery from surgery has gone well. She does report SUI. She states that intercourse got better after hysterectomy, but last week started to become painful again. She also feels like she has had an increase in vaginal dryness, but MD checked hormones and they are all fine (partial hysterectomy). Fluid intake: Yes: 32-48 oz of water; several cups of coffee in the morning     Patient confirms identification and approves PT to assess pelvic floor and treatment Yes     BOWEL MOVEMENT Pain with bowel movement: No Type of bowel movement:Frequency some days she can't go and then other days she goes many times due to pancreatic insufficiency and Strain No Fully empty rectum: No Leakage: No Pads: No Fiber supplement: No   URINATION Pain with urination: No Fully empty bladder: Yes: feels like she has to go often and right after she has gone; no post-void dribbling Stream: Strong Urgency: Yes: occasionally  Frequency: every 30 minutes; 2x/night Leakage: Coughing, Sneezing, and Laughing Pads: No   INTERCOURSE Pain with intercourse: During Penetration - different pain from before her hysterectomy which was stabbing pain; no she feels dryness/fullness (dry tampon) Ability to have vaginal penetration:  Yes: but uncomfortable Types of stimulation: - Climax: Yes: no pain Marinoff Scale    PREGNANCY Vaginal  deliveries 3 Tearing Yes: 1st delivery, minor C-section deliveries 0 Currently pregnant No     PRECAUTIONS: None   WEIGHT BEARING RESTRICTIONS No   FALLS:  Has patient fallen in last 6 months? No, Number of falls: 0   LIVING ENVIRONMENT: Lives with: lives with their family Lives in: House/apartment   OCCUPATION: no   PLOF: Independent   PATIENT GOALS leak less  often     OBJECTIVE 12/04/21: Pelvic floor strength 3/5, endurance 10 seconds, repeat contractions 8x; remains tender to palpation with restriction at posterior forchette (Lt>Rt) and deep Lt levator ani; noticeable decrease in normal muscle bulk in Lt levator ani compared to Rt  10/29/21: pelvic floor strength 3/5, endurance 6 seconds; increased pelvic floor tension in deep Lt pelvic floor with tenderness that is descirbed as the same as pain with deep thrusting.  09/24/21: Decreased lower thoracic mobility and bil SIJ; increased curvature in lower thoracic spine and sacrum; bil lumbar paraspinal restriction/scar tissue restriction; R pelvic rotation     COGNITION:            Overall cognitive status: Within functional limits for tasks assessed                            POSTURE:  Posterior pelvic tilt, forward head/rounded shoulders   PALPATION: Internal Pelvic Floor Mild increase in levator ani tenderness, Rt>Lt; some bladder restriction   External Perineal Exam some palor, otherwise WNL   GENERAL DRA 1 finger width separation at umbilicus; tenderness in upper Rt quadrant; tension/restriction bil lower quadrant without tenderness     PELVIC MMT:   MMT   09/11/2021  Vaginal 2/5, 3 second hold, 10 repetitions  Internal Anal Sphincter    External Anal Sphincter    Puborectalis    Diastasis Recti 1 finger width separation  (Blank rows = not tested)     TONE: low   PROLAPSE: WNL    TODAY'S TREATMENT 12/04/21 Manual: Myofascial release: abdomen Internal pelvic floor techniques: No  emotional/communication barriers or cognitive limitation. Patient is motivated to learn. Patient understands and agrees with treatment goals and plan. PT explains patient will be examined in standing, sitting, and lying down to see how their muscles and joints work. When they are ready, they will be asked to remove their underwear so PT can examine their perineum. The patient is also given the option of providing their own chaperone as one is not provided in our facility. The patient also has the right and is explained the right to defer or refuse any part of the evaluation or treatment including the internal exam. With the patient's consent, PT will use one gloved finger to gently assess the muscles of the pelvic floor, seeing how well it contracts and relaxes and if there is muscle symmetry. After, the patient will get dressed and PT and patient will discuss exam findings and plan of care. PT and patient discuss plan of care, schedule, attendance policy and HEP activities. Internal pelvic floor muscle release Lt superficial and deep layers Internal scar tissue mobilization Lt Self-care: POC and D/C planning HEP review Discussing vaginal estrogen with MD  TREATMENT 11/05/21: Manual: Soft tissue mobilization: Rt glutes Scar tissue mobilization: Myofascial release: abdomen Spinal mobilization: Internal pelvic floor techniques: No emotional/communication barriers or cognitive limitation. Patient is motivated to learn. Patient understands and agrees with treatment goals and plan. PT explains patient will be examined in standing, sitting, and lying down to see how their muscles and joints work. When they are ready, they will be asked to remove their underwear so PT can examine their perineum. The patient is also given the option of providing their own chaperone as one is not provided in our facility. The patient also has the right and is explained the right to defer or refuse any part of the evaluation or  treatment including the  internal exam. With the patient's consent, PT will use one gloved finger to gently assess the muscles of the pelvic floor, seeing how well it contracts and relaxes and if there is muscle symmetry. After, the patient will get dressed and PT and patient will discuss exam findings and plan of care. PT and patient discuss plan of care, schedule, attendance policy and HEP activities. Internal pelvic floor muscle release Lt superficial and deep layers Internal scar tissue mobilization Lt Dry needling: Rt glutes Neuromuscular re-education: Core retraining:  Core facilitation: Form correction: Pelvic floor contraction training: Down training: Exercises: Stretches/mobility: Seated piriformis stretch 2 x 60 sec bil Seated hamstring stretch 2 x 60 sec bil Strengthening: Therapeutic activities: Functional strengthening activities: Self-care:    TREATMENT 10/29/21: Manual: Soft tissue mobilization: Abdominal, focus on Lt side Scar tissue mobilization: Myofascial release: Spinal mobilization: Internal pelvic floor techniques: No emotional/communication barriers or cognitive limitation. Patient is motivated to learn. Patient understands and agrees with treatment goals and plan. PT explains patient will be examined in standing, sitting, and lying down to see how their muscles and joints work. When they are ready, they will be asked to remove their underwear so PT can examine their perineum. The patient is also given the option of providing their own chaperone as one is not provided in our facility. The patient also has the right and is explained the right to defer or refuse any part of the evaluation or treatment including the internal exam. With the patient's consent, PT will use one gloved finger to gently assess the muscles of the pelvic floor, seeing how well it contracts and relaxes and if there is muscle symmetry. After, the patient will get dressed and PT and patient will  discuss exam findings and plan of care. PT and patient discuss plan of care, schedule, attendance policy and HEP activities. Lt deep pelvic floor muscle release Self-care: Pelvic floor tension/stress and anxiety role in this Lubricants Continued plan of care Pelvic floor muscle wand    PATIENT EDUCATION:  Education details: See above self-care Person educated: Patient Education method: Explanation, Demonstration, Tactile cues, Verbal cues, and Handouts Education comprehension: verbalized understanding     HOME EXERCISE PROGRAM: [id number]   ASSESSMENT:   CLINICAL IMPRESSION: Patient doing overall very well with all urinary symptoms. However, due to recent illness, she has not been working on [employer] and lifestyle modifications and feels like it is impacting pelvic floor strength, low back pain,and dyspareunia. She has started having more pain with intercourse, but is using wand to help release restriction. She demonstrates good improvements in pelvic floor strength and endurance, but does have notable dryness throughout vulva/vagina and restriction surrounding Lt perineal body and levator ani. We discussed speaking with MD about possible benefit of estrogen cream. She also discussed benefit of HEP and relationship between the Lt hip weakness and pelvic floor dysfunction. Believe that inactivity and vaginal dryness are causing increased pelvic floor restriction. She will continue to benefit from skilled PT intervention in order to maintain urinary function, decrease dyspareunia, and progress functional strengthening to tolerance.      OBJECTIVE IMPAIRMENTS decreased activity tolerance, decreased coordination, decreased endurance, decreased mobility, decreased strength, and pain.    ACTIVITY LIMITATIONS community activity.    PERSONAL FACTORS 1-2 comorbidities: 3 vaginal deliveries, hysterectomy and rectocele repair 04/23/2022  are also affecting patient's functional outcome.      REHAB  POTENTIAL: Good   CLINICAL DECISION MAKING: Stable/uncomplicated   EVALUATION COMPLEXITY: Low     GOALS:  Goals reviewed with patient? Yes   SHORT TERM GOALS: Target date: 10/12/2021   Pt will be independent with HEP.  Baseline:  Goal status: MET 12/04/21   2.  Pt will be independent with the knack, urge suppression technique, and double voiding in order to improve bladder habits and decrease urinary incontinence.    Baseline:  Goal status: MET 12/04/21   3.  Pt will be independent with use of squatty potty, relaxed toileting mechanics, and improved bowel movement techniques in order to increase ease of bowel movements and complete evacuation.    Baseline:  Goal status: MET 12/04/21     LONG TERM GOALS: Target date: 01/29/2022   Pt will be independent with advanced HEP.  Baseline: Discussed more regular performance Goal status: IN PROGRESS 12/04/21   2.  Pt will report no leaks with laughing, coughing, sneezing in order to improve comfort with interpersonal relationships and community activities.    Baseline:  Goal status: MET 12/04/21   3.  Pt will demonstrate normal pelvic floor muscle tone and A/ROM, able to achieve 4/5 strength with contractions and 10 sec endurance, in order to provide appropriate lumbopelvic support in functional activities.    Baseline: pelvic floor strength 3/5, endurance 10 seconds Goal status: IN PROGRESS 12/04/21   4.  Pt will be able to go 2-3 hours in between voids without urgency or incontinence in order to improve QOL and perform all functional activities with less difficulty.    Baseline:  Goal status: MET 12/04/21  5. Patient will report 0/10 pain with intercourse.  Baseline: painful  Goal Status: INITIAL     PLAN: PT FREQUENCY: 1x/week   PT DURATION: 12 weeks   PLANNED INTERVENTIONS: Therapeutic exercises, Therapeutic activity, Neuromuscular re-education, Balance training, Gait training, Patient/Family education, Joint mobilization, Dry  Needling, and Manual therapy   PLAN FOR NEXT SESSION: Continue to address increase in pelvic pain/tension. Progress strengthening/mobility activities.     Heather Roberts, PT, DPT06/06/239:34 AM

## 2021-12-10 ENCOUNTER — Ambulatory Visit: Payer: 59

## 2021-12-10 DIAGNOSIS — M6281 Muscle weakness (generalized): Secondary | ICD-10-CM | POA: Diagnosis not present

## 2021-12-10 DIAGNOSIS — R279 Unspecified lack of coordination: Secondary | ICD-10-CM

## 2021-12-10 NOTE — Therapy (Signed)
OUTPATIENT PHYSICAL THERAPY TREATMENT NOTE   Patient Name: Rebecca Holt MRN: [id number] DOB:1986-03-03, 36 y.o., female Today's Date: 12/10/2021  PCP: Leamon Arnt, MD REFERRING PROVIDER: Paula Compton, MD   PT End of Session - 12/10/21 1143     Visit Number 10    Date for PT Re-Evaluation 01/29/22    Authorization Type [employer]    PT Start Time [phone]    PT Stop Time 0922    PT Time Calculation (min) 40 min    Activity Tolerance Patient tolerated treatment well    Behavior During Therapy [facility] for tasks assessed/performed               Past Medical History:  Diagnosis Date   Anxiety    Arthritis    si joints   Bipolar disorder ([place])    Chicken pox    as child   Depressive disorder 02/08/2019   History of COVID-19 09/09/2020   g i issues x 2-3 weeks all symptoms reolved   History of hypothyroidism    yrs ago per pt on 04-17-2021   Pneumonia    yrs ago per pt on 04-17-2021   Wears glasses    Past Surgical History:  Procedure Laterality Date   ANTERIOR AND POSTERIOR REPAIR N/A 04/23/2021   Procedure: POSTERIOR REPAIR (RECTOCELE);  Surgeon: Paula Compton, MD;  Location: [facility];  Service: Gynecology;  Laterality: N/A;   BIOPSY  07/25/2021   Procedure: BIOPSY;  Surgeon: [name], MD;  Location: [facility];  Service: Gastroenterology;;   COLONOSCOPY WITH PROPOFOL N/A 07/25/2021   Procedure: COLONOSCOPY WITH PROPOFOL;  Surgeon: [name], MD;  Location: [facility];  Service: Gastroenterology;  Laterality: N/A;   CYSTOSCOPY N/A 04/23/2021   Procedure: CYSTOSCOPY;  Surgeon: Paula Compton, MD;  Location: [facility];  Service: Gynecology;  Laterality: N/A;   ESOPHAGOGASTRODUODENOSCOPY (EGD) WITH PROPOFOL N/A 07/25/2021   Procedure: ESOPHAGOGASTRODUODENOSCOPY (EGD) WITH PROPOFOL;  Surgeon: [name], MD;  Location: [facility];  Service: Gastroenterology;  Laterality: N/A;   LAPAROSCOPIC VAGINAL HYSTERECTOMY WITH  SALPINGECTOMY Bilateral 04/23/2021   Procedure: LAPAROSCOPIC ASSISTED VAGINAL HYSTERECTOMY WITH BILATERAL  SALPINGECTOMY;  Surgeon: Paula Compton, MD;  Location: [place];  Service: Gynecology;  Laterality: Bilateral;   TONSILLECTOMY Bilateral 2020   TRANSFORAMINAL LUMBAR INTERBODY FUSION (TLIF) WITH PEDICLE SCREW FIXATION 1 LEVEL N/A 05/07/2019   Procedure: Lumbar Five- Sacral One Transforaminal lumbar interbody Fusion;  Surgeon: Erline Levine, MD;  Location: [place];  Service: Neurosurgery;  Laterality: N/A;  Lumbar 5 Sacral 1 Transforaminal lumbar interbody fusion   Patient Active Problem List   Diagnosis Date Noted   Iron deficiency anemia 10/17/2021   Lumbar post-laminectomy syndrome 10/17/2021   Lumbar spondylosis 10/17/2021   S/P laparoscopic assisted vaginal hysterectomy (LAVH) 04/23/2021   Dyspareunia in female 04/11/2021   Dysmenorrhea 04/11/2021   Exocrine pancreatic insufficiency 03/23/2021   Panic attacks 03/20/2020   Status post lumbar spinal fusion 03/20/2020   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   Lumbar foraminal stenosis 05/07/2019   GAD (generalized anxiety disorder) 03/10/2019   OCD (obsessive compulsive disorder) 03/10/2019   Major depression, recurrent, chronic ([place]) 02/08/2019    REFERRING DIAG: R39.14 (ICD-10-CM) - Feeling of incomplete bladder emptying  THERAPY DIAG:  Muscle weakness (generalized)  Unspecified lack of coordination  PERTINENT HISTORY: 3 vaginal deliveries, hysterectomy and rectocele repair 04/23/2022; lumbar spinal fusion (L5/S1) 2020  PRECAUTIONS: NA  SUBJECTIVE: Patient states that she worked very hard over the last week  to get back into HEP. She states that she feels better in general just performing more exercise. She states that she had intercourse in the last week and there were just a couple of moments where she felt an increase in pain. As she has performing more exercises, she continues to notice that Lt LE  is weaker.   SUBJECTIVE3/14/2023:                                                                                                                                                                                            SUBJECTIVE STATEMENT: Pt states that she had hysterectomy in October due to heavy, painful menstrual cycles/pain with intercourse in which they also repaired rectocele. She  feels like recovery from surgery has gone well. She does report SUI. She states that intercourse got better after hysterectomy, but last week started to become painful again. She also feels like she has had an increase in vaginal dryness, but MD checked hormones and they are all fine (partial hysterectomy). Fluid intake: Yes: 32-48 oz of water; several cups of coffee in the morning     Patient confirms identification and approves PT to assess pelvic floor and treatment Yes     BOWEL MOVEMENT Pain with bowel movement: No Type of bowel movement:Frequency some days she can't go and then other days she goes many times due to pancreatic insufficiency and Strain No Fully empty rectum: No Leakage: No Pads: No Fiber supplement: No   URINATION Pain with urination: No Fully empty bladder: Yes: feels like she has to go often and right after she has gone; no post-void dribbling Stream: Strong Urgency: Yes: occasionally  Frequency: every 30 minutes; 2x/night Leakage: Coughing, Sneezing, and Laughing Pads: No   INTERCOURSE Pain with intercourse: During Penetration - different pain from before her hysterectomy which was stabbing pain; no she feels dryness/fullness (dry tampon) Ability to have vaginal penetration:  Yes: but uncomfortable Types of stimulation: - Climax: Yes: no pain Marinoff Scale    PREGNANCY Vaginal deliveries 3 Tearing Yes: 1st delivery, minor C-section deliveries 0 Currently pregnant No     PRECAUTIONS: None   WEIGHT BEARING RESTRICTIONS No   FALLS:  Has patient fallen in last 6  months? No, Number of falls: 0   LIVING ENVIRONMENT: Lives with: lives with their family Lives in: House/apartment   OCCUPATION: no   PLOF: Independent   PATIENT GOALS leak less often     OBJECTIVE 12/04/21: Pelvic floor strength 3/5, endurance 10 seconds, repeat contractions 8x; remains tender to palpation with restriction at posterior forchette (Lt>Rt) and deep Lt levator ani; noticeable decrease in  normal muscle bulk in Lt levator ani compared to Rt  10/29/21: pelvic floor strength 3/5, endurance 6 seconds; increased pelvic floor tension in deep Lt pelvic floor with tenderness that is descirbed as the same as pain with deep thrusting.  09/24/21: Decreased lower thoracic mobility and bil SIJ; increased curvature in lower thoracic spine and sacrum; bil lumbar paraspinal restriction/scar tissue restriction; R pelvic rotation     COGNITION:            Overall cognitive status: Within functional limits for tasks assessed                            POSTURE:  Posterior pelvic tilt, forward head/rounded shoulders   PALPATION: Internal Pelvic Floor Mild increase in levator ani tenderness, Rt>Lt; some bladder restriction   External Perineal Exam some palor, otherwise WNL   GENERAL DRA 1 finger width separation at umbilicus; tenderness in upper Rt quadrant; tension/restriction bil lower quadrant without tenderness     PELVIC MMT:   MMT   09/11/2021  Vaginal 2/5, 3 second hold, 10 repetitions  Internal Anal Sphincter    External Anal Sphincter    Puborectalis    Diastasis Recti 1 finger width separation  (Blank rows = not tested)     TONE: low   PROLAPSE: WNL    TODAY'S TREATMENT 12/10/21 Neuromuscular re-education: Core facilitation: Cat/cow 2 x 10 Candle stick dippers 10x bil Modified side plank crunch 10 x bil Bird-dog 2 x 10 Bear crawls, 10 x 5 seconds Ball lift with core activation 10x Exercises: Stretches/mobility: Ball up wall 10x Kneeling side lunge stretch  10 breaths bil Wide stance thoracic openers with yoga block 10x bil Strengthening: Fire hydrants 10x bil Quadruped leg rainbows Sidestepping 3 laps yellow band 3-way kick 10x ea, bil   TREATMENT 12/04/21 Manual: Myofascial release: abdomen Internal pelvic floor techniques: No emotional/communication barriers or cognitive limitation. Patient is motivated to learn. Patient understands and agrees with treatment goals and plan. PT explains patient will be examined in standing, sitting, and lying down to see how their muscles and joints work. When they are ready, they will be asked to remove their underwear so PT can examine their perineum. The patient is also given the option of providing their own chaperone as one is not provided in our facility. The patient also has the right and is explained the right to defer or refuse any part of the evaluation or treatment including the internal exam. With the patient's consent, PT will use one gloved finger to gently assess the muscles of the pelvic floor, seeing how well it contracts and relaxes and if there is muscle symmetry. After, the patient will get dressed and PT and patient will discuss exam findings and plan of care. PT and patient discuss plan of care, schedule, attendance policy and HEP activities. Internal pelvic floor muscle release Lt superficial and deep layers Internal scar tissue mobilization Lt Self-care: POC and D/C planning HEP review Discussing vaginal estrogen with MD  TREATMENT 11/05/21: Manual: Soft tissue mobilization: Rt glutes Scar tissue mobilization: Myofascial release: abdomen Spinal mobilization: Internal pelvic floor techniques: No emotional/communication barriers or cognitive limitation. Patient is motivated to learn. Patient understands and agrees with treatment goals and plan. PT explains patient will be examined in standing, sitting, and lying down to see how their muscles and joints work. When they are ready, they  will be asked to remove their underwear so PT can examine their  perineum. The patient is also given the option of providing their own chaperone as one is not provided in our facility. The patient also has the right and is explained the right to defer or refuse any part of the evaluation or treatment including the internal exam. With the patient's consent, PT will use one gloved finger to gently assess the muscles of the pelvic floor, seeing how well it contracts and relaxes and if there is muscle symmetry. After, the patient will get dressed and PT and patient will discuss exam findings and plan of care. PT and patient discuss plan of care, schedule, attendance policy and HEP activities. Internal pelvic floor muscle release Lt superficial and deep layers Internal scar tissue mobilization Lt Dry needling: Rt glutes Exercises: Stretches/mobility: Seated piriformis stretch 2 x 60 sec bil Seated hamstring stretch 2 x 60 sec bil Strengthening:    PATIENT EDUCATION:  Education details: See above self-care Person educated: Patient Education method: Explanation, Demonstration, Tactile cues, Verbal cues, and Handouts Education comprehension: verbalized understanding     HOME EXERCISE PROGRAM: [id number]   ASSESSMENT:   CLINICAL IMPRESSION: Patient overall doing well with return to more regular performance of HEP. She was able to return to and progress many exercises today. She did require cues for improved pacing of exercise and core control, but did well once she had verbal reminders. HEP updated. She will continue to benefit from skilled PT intervention in order to maintain urinary function, decrease dyspareunia, and progress functional strengthening to tolerance.      OBJECTIVE IMPAIRMENTS decreased activity tolerance, decreased coordination, decreased endurance, decreased mobility, decreased strength, and pain.    ACTIVITY LIMITATIONS community activity.    PERSONAL FACTORS 1-2  comorbidities: 3 vaginal deliveries, hysterectomy and rectocele repair 04/23/2022  are also affecting patient's functional outcome.      REHAB POTENTIAL: Good   CLINICAL DECISION MAKING: Stable/uncomplicated   EVALUATION COMPLEXITY: Low     GOALS: Goals reviewed with patient? Yes   SHORT TERM GOALS: Target date: 10/12/2021   Pt will be independent with HEP.  Baseline:  Goal status: MET 12/04/21   2.  Pt will be independent with the knack, urge suppression technique, and double voiding in order to improve bladder habits and decrease urinary incontinence.    Baseline:  Goal status: MET 12/04/21   3.  Pt will be independent with use of squatty potty, relaxed toileting mechanics, and improved bowel movement techniques in order to increase ease of bowel movements and complete evacuation.    Baseline:  Goal status: MET 12/04/21     LONG TERM GOALS: Target date: 01/29/2022   Pt will be independent with advanced HEP.  Baseline: Discussed more regular performance Goal status: IN PROGRESS 12/04/21   2.  Pt will report no leaks with laughing, coughing, sneezing in order to improve comfort with interpersonal relationships and community activities.    Baseline:  Goal status: MET 12/04/21   3.  Pt will demonstrate normal pelvic floor muscle tone and A/ROM, able to achieve 4/5 strength with contractions and 10 sec endurance, in order to provide appropriate lumbopelvic support in functional activities.    Baseline: pelvic floor strength 3/5, endurance 10 seconds Goal status: IN PROGRESS 12/04/21   4.  Pt will be able to go 2-3 hours in between voids without urgency or incontinence in order to improve QOL and perform all functional activities with less difficulty.    Baseline:  Goal status: MET 12/04/21  5. Patient will report 0/10 pain  with intercourse.  Baseline: painful  Goal Status: IN PROGRESS     PLAN: PT FREQUENCY: 1x/week   PT DURATION: 12 weeks   PLANNED INTERVENTIONS:  Therapeutic exercises, Therapeutic activity, Neuromuscular re-education, Balance training, Gait training, Patient/Family education, Joint mobilization, Dry Needling, and Manual therapy   PLAN FOR NEXT SESSION: Continue to address increase in pelvic pain/tension. Progress strengthening/mobility activities.     Heather Roberts, PT, DPT06/06/2311:31 PM

## 2021-12-18 ENCOUNTER — Ambulatory Visit: Payer: 59

## 2021-12-18 DIAGNOSIS — M6281 Muscle weakness (generalized): Secondary | ICD-10-CM

## 2021-12-18 DIAGNOSIS — R279 Unspecified lack of coordination: Secondary | ICD-10-CM

## 2021-12-18 NOTE — Therapy (Signed)
OUTPATIENT PHYSICAL THERAPY TREATMENT NOTE   Patient Name: Rebecca Holt MRN: [id number] DOB:10/14/1985, 36 y.o., female Today's Date: 12/18/2021  PCP: Leamon Arnt, MD REFERRING PROVIDER: Paula Compton, MD   PT End of Session - 12/18/21 1101     Visit Number 11    Date for PT Re-Evaluation 01/29/22    Authorization Type [employer]    PT Start Time 1100    PT Stop Time 1140    PT Time Calculation (min) 40 min    Activity Tolerance Patient tolerated treatment well    Behavior During Therapy [facility] for tasks assessed/performed                Past Medical History:  Diagnosis Date   Anxiety    Arthritis    si joints   Bipolar disorder ([place])    Chicken pox    as child   Depressive disorder 02/08/2019   History of COVID-19 09/09/2020   g i issues x 2-3 weeks all symptoms reolved   History of hypothyroidism    yrs ago per pt on 04-17-2021   Pneumonia    yrs ago per pt on 04-17-2021   Wears glasses    Past Surgical History:  Procedure Laterality Date   ANTERIOR AND POSTERIOR REPAIR N/A 04/23/2021   Procedure: POSTERIOR REPAIR (RECTOCELE);  Surgeon: Paula Compton, MD;  Location: [facility];  Service: Gynecology;  Laterality: N/A;   BIOPSY  07/25/2021   Procedure: BIOPSY;  Surgeon: [name], MD;  Location: [facility];  Service: Gastroenterology;;   COLONOSCOPY WITH PROPOFOL N/A 07/25/2021   Procedure: COLONOSCOPY WITH PROPOFOL;  Surgeon: [name], MD;  Location: [facility];  Service: Gastroenterology;  Laterality: N/A;   CYSTOSCOPY N/A 04/23/2021   Procedure: CYSTOSCOPY;  Surgeon: Paula Compton, MD;  Location: [facility];  Service: Gynecology;  Laterality: N/A;   ESOPHAGOGASTRODUODENOSCOPY (EGD) WITH PROPOFOL N/A 07/25/2021   Procedure: ESOPHAGOGASTRODUODENOSCOPY (EGD) WITH PROPOFOL;  Surgeon: [name], MD;  Location: [facility];  Service: Gastroenterology;  Laterality: N/A;   LAPAROSCOPIC VAGINAL HYSTERECTOMY WITH  SALPINGECTOMY Bilateral 04/23/2021   Procedure: LAPAROSCOPIC ASSISTED VAGINAL HYSTERECTOMY WITH BILATERAL  SALPINGECTOMY;  Surgeon: Paula Compton, MD;  Location: [facility];  Service: Gynecology;  Laterality: Bilateral;   TONSILLECTOMY Bilateral 2020   TRANSFORAMINAL LUMBAR INTERBODY FUSION (TLIF) WITH PEDICLE SCREW FIXATION 1 LEVEL N/A 05/07/2019   Procedure: Lumbar Five- Sacral One Transforaminal lumbar interbody Fusion;  Surgeon: Erline Levine, MD;  Location: [place];  Service: Neurosurgery;  Laterality: N/A;  Lumbar 5 Sacral 1 Transforaminal lumbar interbody fusion   Patient Active Problem List   Diagnosis Date Noted   Iron deficiency anemia 10/17/2021   Lumbar post-laminectomy syndrome 10/17/2021   Lumbar spondylosis 10/17/2021   S/P laparoscopic assisted vaginal hysterectomy (LAVH) 04/23/2021   Dyspareunia in female 04/11/2021   Dysmenorrhea 04/11/2021   Exocrine pancreatic insufficiency 03/23/2021   Panic attacks 03/20/2020   Status post lumbar spinal fusion 03/20/2020   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 She has been working very hard on exercises over the last week. She is feeling a little bit of low back pain pain on Lt side right around SIJ. She is currently in 4/10 pain, but last night sleeping pain was keeping her awake. Exercises are helpful at reducing pain. She  is still using pelvic floor muscle wand some to help with deep pelvic floor pain.      SUBJECTIVE3/14/2023:                                                                                                                                                                                            SUBJECTIVE STATEMENT: Pt states that she had hysterectomy in October due to heavy, painful menstrual cycles/pain with intercourse in which they also repaired rectocele. She  feels like recovery from surgery has gone well. She does report SUI. She states that intercourse got better after hysterectomy, but last week started to become painful again. She also feels like she has had an increase in vaginal dryness, but MD checked hormones and they are all fine (partial hysterectomy). Fluid intake: Yes: 32-48 oz of water; several cups of coffee in the morning     Patient confirms identification and approves PT to assess pelvic floor and treatment Yes     BOWEL MOVEMENT Pain with bowel movement: No Type of bowel movement:Frequency some days she can't go and then other days she goes many times due to pancreatic insufficiency and Strain No Fully empty rectum: No Leakage: No Pads: No Fiber supplement: No   URINATION Pain with urination: No Fully empty bladder: Yes: feels like she has to go often and right after she has gone; no post-void dribbling Stream: Strong Urgency: Yes: occasionally  Frequency: every 30 minutes; 2x/night Leakage: Coughing, Sneezing, and Laughing Pads: No   INTERCOURSE Pain with intercourse: During Penetration - different pain from before her hysterectomy which was stabbing pain; no she feels dryness/fullness (dry tampon) Ability to have vaginal penetration:  Yes: but uncomfortable Types of stimulation: - Climax: Yes: no pain Marinoff Scale    PREGNANCY Vaginal deliveries 3 Tearing Yes: 1st delivery, minor C-section deliveries 0 Currently pregnant No     PRECAUTIONS:  None   WEIGHT BEARING RESTRICTIONS No   FALLS:  Has patient fallen in last 6 months? No, Number of falls: 0   LIVING ENVIRONMENT: Lives with: lives with their family Lives in: House/apartment   OCCUPATION: no   PLOF: Independent   PATIENT GOALS leak less often     OBJECTIVE 12/04/21: Pelvic floor strength 3/5, endurance 10 seconds, repeat contractions 8x; remains  tender to palpation with restriction at posterior forchette (Lt>Rt) and deep Lt levator ani; noticeable decrease in normal muscle bulk in Lt levator ani compared to Rt  10/29/21: pelvic floor strength 3/5, endurance 6 seconds; increased pelvic floor tension in deep Lt pelvic floor with tenderness that is descirbed as the same as pain with deep thrusting.  09/24/21: Decreased lower thoracic mobility and bil SIJ; increased curvature in lower thoracic spine and sacrum; bil lumbar paraspinal restriction/scar tissue restriction; R pelvic rotation     COGNITION:            Overall cognitive status: Within functional limits for tasks assessed                            POSTURE:  Posterior pelvic tilt, forward head/rounded shoulders   PALPATION: Internal Pelvic Floor Mild increase in levator ani tenderness, Rt>Lt; some bladder restriction   External Perineal Exam some palor, otherwise WNL   GENERAL DRA 1 finger width separation at umbilicus; tenderness in upper Rt quadrant; tension/restriction bil lower quadrant without tenderness     PELVIC MMT:   MMT   09/11/2021  Vaginal 2/5, 3 second hold, 10 repetitions  Internal Anal Sphincter    External Anal Sphincter    Puborectalis    Diastasis Recti 1 finger width separation  (Blank rows = not tested)     TONE: low   PROLAPSE: WNL    TODAY'S TREATMENT 12/18/21 Manual: Soft tissue mobilization: Lt anterior and posterior hip Dry needling: Trigger Point Dry-Needling  Treatment instructions: Expect mild to moderate muscle soreness. S/S of pneumothorax if dry needled  over a lung field, and to seek immediate medical attention should they occur. Patient verbalized understanding of these instructions and education.  Patient Consent Given: Yes Education handout provided: Previously provided Muscles treated: Lt glutes/hip flexors Electrical stimulation performed: No Parameters: N/A Treatment response/outcome: significant twitch response, release, and report of improved pain Exercises: Stretches/mobility: Cat cow 2 x 10 Wide leg bent over thoracic mobility, 10x bil   TREATMENT 12/10/21 Neuromuscular re-education: Core facilitation: Cat/cow 2 x 10 Candle stick dippers 10x bil Modified side plank crunch 10 x bil Bird-dog 2 x 10 Bear crawls, 10 x 5 seconds Ball lift with core activation 10x Exercises: Stretches/mobility: Ball up wall 10x Kneeling side lunge stretch 10 breaths bil Wide stance thoracic openers with yoga block 10x bil Strengthening: Fire hydrants 10x bil Quadruped leg rainbows Sidestepping 3 laps yellow band 3-way kick 10x ea, bil   TREATMENT 12/04/21 Manual: Myofascial release: abdomen Internal pelvic floor techniques: No emotional/communication barriers or cognitive limitation. Patient is motivated to learn. Patient understands and agrees with treatment goals and plan. PT explains patient will be examined in standing, sitting, and lying down to see how their muscles and joints work. When they are ready, they will be asked to remove their underwear so PT can examine their perineum. The patient is also given the option of providing their own chaperone as one is not provided in our facility. The patient also has the right and is explained the right to defer or refuse any part of the evaluation or treatment including the internal exam. With the patient's consent, PT will use one gloved finger to gently assess the muscles of the pelvic floor, seeing how well it contracts and relaxes and if there is muscle symmetry. After, the patient will  get dressed and PT and patient will discuss exam findings and plan of care.  PT and patient discuss plan of care, schedule, attendance policy and HEP activities. Internal pelvic floor muscle release Lt superficial and deep layers Internal scar tissue mobilization Lt Self-care: POC and D/C planning HEP review Discussing vaginal estrogen with MD   PATIENT EDUCATION:  Education details: Exercise progressions Person educated: Patient Education method: Explanation, Demonstration, Tactile cues, Verbal cues, and Handouts Education comprehension: verbalized understanding     HOME EXERCISE PROGRAM: [id number]   ASSESSMENT:   CLINICAL IMPRESSION: Patient feels like internal vaginal release techniques are not necessarily helpful any longer with deep dyspareunia. We attempted external release of Lt lower quadrant (where deep dyspareunia is located) and DN to hip flexors; DN also performed surrounding Lt SIJ in glutes with significant twitch response. Patient reported with DN and manual techniques to iliacus she felt reproduction of pain during intercourse. Patient reported good improvement in pain after treatment session, 2/10. She tolerated all mobility exercises very well; no new strengthening exercises added to HEP at this time due to appropriate level of challenge. We attempted She will continue to benefit from skilled PT intervention in order to maintain urinary function, decrease dyspareunia, and progress functional strengthening to tolerance.      OBJECTIVE IMPAIRMENTS decreased activity tolerance, decreased coordination, decreased endurance, decreased mobility, decreased strength, and pain.    ACTIVITY LIMITATIONS community activity.    PERSONAL FACTORS 1-2 comorbidities: 3 vaginal deliveries, hysterectomy and rectocele repair 04/23/2022  are also affecting patient's functional outcome.      REHAB POTENTIAL: Good   CLINICAL DECISION MAKING: Stable/uncomplicated   EVALUATION COMPLEXITY:  Low     GOALS: Goals reviewed with patient? Yes   SHORT TERM GOALS: Target date: 10/12/2021   Pt will be independent with HEP.  Baseline:  Goal status: MET 12/04/21   2.  Pt will be independent with the knack, urge suppression technique, and double voiding in order to improve bladder habits and decrease urinary incontinence.    Baseline:  Goal status: MET 12/04/21   3.  Pt will be independent with use of squatty potty, relaxed toileting mechanics, and improved bowel movement techniques in order to increase ease of bowel movements and complete evacuation.    Baseline:  Goal status: MET 12/04/21     LONG TERM GOALS: Target date: 01/29/2022   Pt will be independent with advanced HEP.  Baseline: Discussed more regular performance Goal status: IN PROGRESS 12/04/21   2.  Pt will report no leaks with laughing, coughing, sneezing in order to improve comfort with interpersonal relationships and community activities.    Baseline:  Goal status: MET 12/04/21   3.  Pt will demonstrate normal pelvic floor muscle tone and A/ROM, able to achieve 4/5 strength with contractions and 10 sec endurance, in order to provide appropriate lumbopelvic support in functional activities.    Baseline: pelvic floor strength 3/5, endurance 10 seconds Goal status: IN PROGRESS 12/04/21   4.  Pt will be able to go 2-3 hours in between voids without urgency or incontinence in order to improve QOL and perform all functional activities with less difficulty.    Baseline:  Goal status: MET 12/04/21  5. Patient will report 0/10 pain with intercourse.  Baseline: painful  Goal Status: IN PROGRESS 12/04/21     PLAN: PT FREQUENCY: 1x/week   PT DURATION: 12 weeks   PLANNED INTERVENTIONS: Therapeutic exercises, Therapeutic activity, Neuromuscular re-education, Balance training, Gait training, Patient/Family education, Joint mobilization, Dry Needling, and Manual therapy   PLAN FOR NEXT SESSION: Continue to address increase  in  pelvic pain/tension. Progress strengthening/mobility activities.     Heather Roberts, PT, DPT06/20/2311:43 AM

## 2021-12-24 ENCOUNTER — Ambulatory Visit: Payer: 59

## 2021-12-24 DIAGNOSIS — M6281 Muscle weakness (generalized): Secondary | ICD-10-CM

## 2021-12-24 DIAGNOSIS — R279 Unspecified lack of coordination: Secondary | ICD-10-CM

## 2021-12-31 ENCOUNTER — Ambulatory Visit: Payer: 59 | Attending: Obstetrics and Gynecology

## 2021-12-31 DIAGNOSIS — R279 Unspecified lack of coordination: Secondary | ICD-10-CM | POA: Insufficient documentation

## 2021-12-31 DIAGNOSIS — M6281 Muscle weakness (generalized): Secondary | ICD-10-CM | POA: Diagnosis not present

## 2021-12-31 NOTE — Therapy (Signed)
OUTPATIENT PHYSICAL THERAPY TREATMENT NOTE   Patient Name: Rebecca Holt MRN: [id number] DOB:[birth year], 36 y.o., female Today's Date: 12/31/2021  PCP: Leamon Arnt, MD REFERRING PROVIDER: Paula Compton, MD   PT End of Session - 12/31/21 1150     Visit Number 13    Date for PT Re-Evaluation 01/29/22    Authorization Type [employer]    PT Start Time 3254    PT Stop Time 1225    PT Time Calculation (min) 40 min    Activity Tolerance Patient tolerated treatment well    Behavior During Therapy [facility] for tasks assessed/performed                Past Medical History:  Diagnosis Date   Anxiety    Arthritis    si joints   Bipolar disorder ([place])    Chicken pox    as child   Depressive disorder 02/08/2019   History of COVID-19 09/09/2020   g i issues x 2-3 weeks all symptoms reolved   History of hypothyroidism    yrs ago per pt on 04-17-2021   Pneumonia    yrs ago per pt on 04-17-2021   Wears glasses    Past Surgical History:  Procedure Laterality Date   ANTERIOR AND POSTERIOR REPAIR N/A 04/23/2021   Procedure: POSTERIOR REPAIR (RECTOCELE);  Surgeon: Paula Compton, MD;  Location: [facility];  Service: Gynecology;  Laterality: N/A;   BIOPSY  07/25/2021   Procedure: BIOPSY;  Surgeon: [name], MD;  Location: [facility];  Service: Gastroenterology;;   COLONOSCOPY WITH PROPOFOL N/A 07/25/2021   Procedure: COLONOSCOPY WITH PROPOFOL;  Surgeon: [name], MD;  Location: [facility];  Service: Gastroenterology;  Laterality: N/A;   CYSTOSCOPY N/A 04/23/2021   Procedure: CYSTOSCOPY;  Surgeon: Paula Compton, MD;  [facility];  Service: Gynecology;  Laterality: N/A;   ESOPHAGOGASTRODUODENOSCOPY (EGD) WITH PROPOFOL N/A 07/25/2021   Procedure: ESOPHAGOGASTRODUODENOSCOPY (EGD) WITH PROPOFOL;  Surgeon: [name], MD;  Location: [facility];  Service: Gastroenterology;  Laterality: N/A;   LAPAROSCOPIC VAGINAL HYSTERECTOMY WITH  SALPINGECTOMY Bilateral 04/23/2021   Procedure: LAPAROSCOPIC ASSISTED VAGINAL HYSTERECTOMY WITH BILATERAL  SALPINGECTOMY;  Surgeon: Paula Compton, MD;  Location: [place];  Service: Gynecology;  Laterality: Bilateral;   TONSILLECTOMY Bilateral 2020   TRANSFORAMINAL LUMBAR INTERBODY FUSION (TLIF) WITH PEDICLE SCREW FIXATION 1 LEVEL N/A 05/07/2019   Procedure: Lumbar Five- Sacral One Transforaminal lumbar interbody Fusion;  Surgeon: Erline Levine, MD;  Location: [place];  Service: Neurosurgery;  Laterality: N/A;  Lumbar 5 Sacral 1 Transforaminal lumbar interbody fusion   Patient Active Problem List   Diagnosis Date Noted   Iron deficiency anemia 10/17/2021   Lumbar post-laminectomy syndrome 10/17/2021   Lumbar spondylosis 10/17/2021   S/P laparoscopic assisted vaginal hysterectomy (LAVH) 04/23/2021   Dyspareunia in female 04/11/2021   Dysmenorrhea 04/11/2021   Exocrine pancreatic insufficiency 03/23/2021   Panic attacks 03/20/2020   Status post lumbar spinal fusion 03/20/2020   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   Lumbar foraminal stenosis 05/07/2019   GAD (generalized anxiety disorder) 03/10/2019   OCD (obsessive compulsive disorder) 03/10/2019   Major depression, recurrent, chronic ([place]) 02/08/2019    REFERRING DIAG: R39.14 (ICD-10-CM) - Feeling of incomplete bladder emptying  THERAPY DIAG:  Muscle weakness (generalized)  Unspecified lack of coordination  PERTINENT HISTORY: 3 vaginal deliveries, hysterectomy and rectocele repair 04/23/2022; lumbar spinal fusion (L5/S1) 2020  PRECAUTIONS: NA  SUBJECTIVE: Patient states that she has intercourse twice since last visit.  She states that first time she had almost no pain. The second time she had slightly more pain, exactly in the spot we have been addressing now. She reports feeling prepared for D/C today.    SUBJECTIVE3/14/2023:                                                                                                                                                                                             SUBJECTIVE STATEMENT: Pt states that she had hysterectomy in October due to heavy, painful menstrual cycles/pain with intercourse in which they also repaired rectocele. She  feels like recovery from surgery has gone well. She does report SUI. She states that intercourse got better after hysterectomy, but last week started to become painful again. She also feels like she has had an increase in vaginal dryness, but MD checked hormones and they are all fine (partial hysterectomy). Fluid intake: Yes: 32-48 oz of water; several cups of coffee in the morning     Patient confirms identification and approves PT to assess pelvic floor and treatment Yes     BOWEL MOVEMENT Pain with bowel movement: No Type of bowel movement:Frequency some days she can't go and then other days she goes many times due to pancreatic insufficiency and Strain No Fully empty rectum: No Leakage: No Pads: No Fiber supplement: No   URINATION Pain with urination: No Fully empty bladder: Yes: feels like she has to go often and right after she has gone; no post-void dribbling Stream: Strong Urgency: Yes: occasionally  Frequency: every 30 minutes; 2x/night Leakage: Coughing, Sneezing, and Laughing Pads: No   INTERCOURSE Pain with intercourse: During Penetration - different pain from before her hysterectomy which was stabbing pain; no she feels dryness/fullness (dry tampon) Ability to have vaginal penetration:  Yes: but uncomfortable Types of stimulation: - Climax: Yes: no pain Marinoff Scale    PREGNANCY Vaginal deliveries 3 Tearing Yes: 1st delivery, minor C-section deliveries 0 Currently pregnant No     PRECAUTIONS: None   WEIGHT BEARING RESTRICTIONS No   FALLS:  Has patient fallen in last 6 months? No, Number of falls: 0   LIVING ENVIRONMENT: Lives with: lives with their family Lives in:  House/apartment   OCCUPATION: no   PLOF: Independent   PATIENT GOALS leak less often     OBJECTIVE 12/31/21: large improvements in left lower quadrant restriction and tenderness; no pelvic floor reassessment today due to large improvements  12/04/21: Pelvic floor strength 3/5, endurance 10 seconds, repeat contractions 8x; remains tender to palpation with restriction at posterior forchette (Lt>Rt) and deep Lt levator ani; noticeable decrease in normal muscle  bulk in Lt levator ani compared to Rt  10/29/21: pelvic floor strength 3/5, endurance 6 seconds; increased pelvic floor tension in deep Lt pelvic floor with tenderness that is descirbed as the same as pain with deep thrusting.  09/24/21: Decreased lower thoracic mobility and bil SIJ; increased curvature in lower thoracic spine and sacrum; bil lumbar paraspinal restriction/scar tissue restriction; R pelvic rotation     COGNITION:            Overall cognitive status: Within functional limits for tasks assessed                            POSTURE:  Posterior pelvic tilt, forward head/rounded shoulders   PALPATION: Internal Pelvic Floor Mild increase in levator ani tenderness, Rt>Lt; some bladder restriction   External Perineal Exam some palor, otherwise WNL   GENERAL DRA 1 finger width separation at umbilicus; tenderness in upper Rt quadrant; tension/restriction bil lower quadrant without tenderness     PELVIC MMT:   MMT   09/11/2021  Vaginal 2/5, 3 second hold, 10 repetitions  Internal Anal Sphincter    External Anal Sphincter    Puborectalis    Diastasis Recti 1 finger width separation  (Blank rows = not tested)     TONE: low   PROLAPSE: WNL    TODAY'S TREATMENT 12/31/21 Manual: Soft tissue mobilization: Bil anterior and posterior hip; low back Dry needling: Trigger Point Dry-Needling  Treatment instructions: Expect mild to moderate muscle soreness. S/S of pneumothorax if dry needled over a lung field, and to seek  immediate medical attention should they occur. Patient verbalized understanding of these instructions and education.  Patient Consent Given: Yes Education handout provided: Previously provided Muscles treated: Lt glutes/hip flexors, L4-S1 parapsinals Electrical stimulation performed: No Parameters: N/A Treatment response/outcome: significant twitch response, release, and report of improved pain Self-care: D/C planning HEP review   TREATMENT 12/24/21 Manual: Soft tissue mobilization: Bil anterior and posterior hip; low back Dry needling: Trigger Point Dry-Needling  Treatment instructions: Expect mild to moderate muscle soreness. S/S of pneumothorax if dry needled over a lung field, and to seek immediate medical attention should they occur. Patient verbalized understanding of these instructions and education.  Patient Consent Given: Yes Education handout provided: Previously provided Muscles treated: Lt glutes/hip flexors, L4-S1 parapsinals Electrical stimulation performed: No Parameters: N/A Treatment response/outcome: significant twitch response, release, and report of improved pain Exercises: Stretches/mobility: Hip flexor stretch with and without rotation 60 sec each, bil Open books 10x bil Prone hip flexor stretch 2 min bil Cat/cow 2 x 10    TREATMENT 12/18/21 Manual: Soft tissue mobilization: Lt anterior and posterior hip Dry needling: Trigger Point Dry-Needling  Treatment instructions: Expect mild to moderate muscle soreness. S/S of pneumothorax if dry needled over a lung field, and to seek immediate medical attention should they occur. Patient verbalized understanding of these instructions and education.  Patient Consent Given: Yes Education handout provided: Previously provided Muscles treated: Lt glutes/hip flexors Electrical stimulation performed: No Parameters: N/A Treatment response/outcome: significant twitch response, release, and report of improved  pain Exercises: Stretches/mobility: Cat cow 2 x 10 Wide leg bent over thoracic mobility, 10x bil      PATIENT EDUCATION:  Education details: Exercise progressions Person educated: Patient Education method: Explanation, Demonstration, Tactile cues, Verbal cues, and Handouts Education comprehension: verbalized understanding     HOME EXERCISE PROGRAM: [id number]   ASSESSMENT:   CLINICAL IMPRESSION: Patient overall having made great improvements with  good control over urinary urgency/incontinence and decreasing dyspareunia. She does still have some dyspareunia, but less intense and not every time. Believe she has the tools to continue making improvements. She has good strength training program that she is performing regularly, which will also continue to improve core musculature, reducing pain. Good tolerance to DN and manual techniques this treatment session with continued improvements in muscle tone/restriction in core muscles. Due to progress, she is prepared to D/C skilled PT intervention; she was encouraged to call with any questions/concerns.      OBJECTIVE IMPAIRMENTS decreased activity tolerance, decreased coordination, decreased endurance, decreased mobility, decreased strength, and pain.    ACTIVITY LIMITATIONS community activity.    PERSONAL FACTORS 1-2 comorbidities: 3 vaginal deliveries, hysterectomy and rectocele repair 04/23/2022  are also affecting patient's functional outcome.      REHAB POTENTIAL: Good   CLINICAL DECISION MAKING: Stable/uncomplicated   EVALUATION COMPLEXITY: Low     GOALS: Goals reviewed with patient? Yes   SHORT TERM GOALS: Target date: 10/12/2021 - updated 12/31/21   Pt will be independent with HEP.  Baseline:  Goal status: MET 12/04/21   2.  Pt will be independent with the knack, urge suppression technique, and double voiding in order to improve bladder habits and decrease urinary incontinence.    Baseline:  Goal status: MET 12/04/21    3.  Pt will be independent with use of squatty potty, relaxed toileting mechanics, and improved bowel movement techniques in order to increase ease of bowel movements and complete evacuation.    Baseline:  Goal status: MET 12/04/21     LONG TERM GOALS: Target date: 01/29/2022 - updated 12/31/21   Pt will be independent with advanced HEP.  Baseline: Discussed more regular performance Goal status: MET 12/31/21   2.  Pt will report no leaks with laughing, coughing, sneezing in order to improve comfort with interpersonal relationships and community activities.    Baseline:  Goal status: MET 12/04/21   3.  Pt will demonstrate normal pelvic floor muscle tone and A/ROM, able to achieve 4/5 strength with contractions and 10 sec endurance, in order to provide appropriate lumbopelvic support in functional activities.    Baseline: pelvic floor strength 3/5, endurance 10 seconds - not reassessed at D/C  Goal status: IN PROGRESS 12/31/21   4.  Pt will be able to go 2-3 hours in between voids without urgency or incontinence in order to improve QOL and perform all functional activities with less difficulty.    Baseline:  Goal status: MET 12/04/21  5. Patient will report 0/10 pain with intercourse.  Baseline: she reports pain is down to 3-4/10 where it was a month ago it was 7-8/10  Goal Status: IN PROGRESS 12/31/21     PLAN: PT FREQUENCY: 1x/week   PT DURATION: 12 weeks   PLANNED INTERVENTIONS: Therapeutic exercises, Therapeutic activity, Neuromuscular re-education, Balance training, Gait training, Patient/Family education, Joint mobilization, Dry Needling, and Manual therapy   PLAN FOR NEXT SESSION: Plan to D/C   PHYSICAL THERAPY DISCHARGE SUMMARY  Visits from Start of Care: 13  Current functional level related to goals / functional outcomes: Almost met   Remaining deficits: See above   Education / Equipment: HEP   Patient agrees to discharge. Patient goals were partially met. Patient is  being discharged due to  having tools to continue progress and fully meet goals on her own.   Heather Roberts, PT, DPT07/08/2310:27 PM

## 2022-01-04 ENCOUNTER — Ambulatory Visit: Payer: 59 | Admitting: Family Medicine

## 2022-01-04 ENCOUNTER — Encounter: Payer: Self-pay | Admitting: Family Medicine

## 2022-01-04 VITALS — BP 100/70 | HR 80 | Temp 100.7°F | Ht 68.0 in | Wt 126.9 lb

## 2022-01-04 DIAGNOSIS — D509 Iron deficiency anemia, unspecified: Secondary | ICD-10-CM

## 2022-01-04 DIAGNOSIS — K8681 Exocrine pancreatic insufficiency: Secondary | ICD-10-CM | POA: Diagnosis not present

## 2022-01-04 DIAGNOSIS — E611 Iron deficiency: Secondary | ICD-10-CM | POA: Diagnosis not present

## 2022-01-04 DIAGNOSIS — M47816 Spondylosis without myelopathy or radiculopathy, lumbar region: Secondary | ICD-10-CM | POA: Diagnosis not present

## 2022-01-04 LAB — FERRITIN: Ferritin: 88.1 ng/mL (ref 10.0–291.0)

## 2022-01-04 NOTE — Progress Notes (Signed)
Established Patient Office Visit  Subjective   Patient ID: Rebecca Holt, female    DOB: March 08, 1986  Age: 36 y.o. MRN: [id number]  Chief Complaint  Patient presents with   Establish Care    Patient presents for transfer of care from Dr Jonni Sanger    HPI   Rebecca Holt is seen to establish care with this practice.  She is new to this site but not new to [facility].  Her past medical history is reviewed and significant for exocrine pancreas insufficiency, history of lumbar spondylosis, recurrent depression, bipolar disorder.  She is followed by multiple specialist including GI and psychiatry.  She had left-sided thyroid nodule that was biopsied recently which came back negative.  She relates tremendous weight loss over the past year.  She had had some diarrhea and loose stools prior to starting pancreas enzyme supplement and those symptoms have resolved though her weight does not come up.  She also had some iron deficiency.  She had history of dysmenorrhea and had hysterectomy.  She has multiple GI symptoms including early satiety, increased gas symptoms, diffuse pains and now intermittent constipation.  She also has history of frequent aphthous ulcers.  She had CT abdomen and pelvis back in December which showed no acute findings.  Does have history of iron deficiency and has had upper and lower endoscopy with no clear etiology.  She received iron infusion and most recent hemoglobin was normal.  She is requesting follow-up ferritin and iron level.  She has had negative work-up for celiac disease with both negative biopsies and reportedly negative labs as well.  Those were done through [facility].  She has chronic fatigue.  Very poor sleep quality.  She states she has scheduled breath test to screen for small intestinal bacterial overgrowth.  Previous helical back to pylori testing reportedly negative.  Recent albumin level 4.0.  Social history-she is married and has 3 children ages 59, 61, 43.  Non-smoker.  No  alcohol.  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  Family history significant for father having type 2 diabetes and hyperlipidemia.  Her mother has bipolar disorder.  Past Medical History:  Diagnosis Date   Anxiety    Arthritis    si joints   Bipolar disorder ([place])    Chicken pox    as child   Depressive disorder 02/08/2019   History of COVID-19 09/09/2020   g i issues x 2-3 weeks all symptoms reolved   History of hypothyroidism    yrs ago per pt on 04-17-2021   Pneumonia    yrs ago per pt on 04-17-2021   Wears glasses    Past Surgical History:  Procedure Laterality Date   ANTERIOR AND POSTERIOR REPAIR N/A 04/23/2021   Procedure: POSTERIOR REPAIR (RECTOCELE);  Surgeon: Paula Compton, MD;  Location: [facility];  Service: Gynecology;  Laterality: N/A;   BIOPSY  07/25/2021   Procedure: BIOPSY;  Surgeon: [name], MD;  [facility];  Service: Gastroenterology;;   COLONOSCOPY WITH PROPOFOL N/A 07/25/2021   Procedure: COLONOSCOPY WITH PROPOFOL;  Surgeon: [name], MD;  Location: [facility];  Service: Gastroenterology;  Laterality: N/A;   CYSTOSCOPY N/A 04/23/2021   Procedure: CYSTOSCOPY;  Surgeon: Paula Compton, MD;  Location: [facility];  Service: Gynecology;  Laterality: N/A;   ESOPHAGOGASTRODUODENOSCOPY (EGD) WITH PROPOFOL N/A 07/25/2021   Procedure: ESOPHAGOGASTRODUODENOSCOPY (EGD) WITH PROPOFOL;  Surgeon: [name], MD;  [facility];  Service: Gastroenterology;  Laterality: N/A;   LAPAROSCOPIC VAGINAL HYSTERECTOMY WITH SALPINGECTOMY Bilateral  04/23/2021   Procedure: LAPAROSCOPIC ASSISTED VAGINAL HYSTERECTOMY WITH BILATERAL  SALPINGECTOMY;  Surgeon: Paula Compton, MD;  Location: [facility];  Service: Gynecology;  Laterality: Bilateral;   TONSILLECTOMY Bilateral 2020   TRANSFORAMINAL LUMBAR INTERBODY FUSION (TLIF) WITH PEDICLE SCREW FIXATION 1 LEVEL N/A 05/07/2019   Procedure: Lumbar Five- Sacral One Transforaminal lumbar  interbody Fusion;  Surgeon: Erline Levine, MD;  Location: [place];  Service: Neurosurgery;  Laterality: N/A;  Lumbar 5 Sacral 1 Transforaminal lumbar interbody fusion    reports that she has never smoked. She has never used smokeless tobacco. She reports that she does not drink alcohol and does not use drugs. family history includes Bipolar disorder in her mother and son; Cancer in her maternal grandfather and paternal grandfather; Diabetes in her father and paternal grandfather; Diabetic kidney disease in her father; Hyperlipidemia in her father; Mental illness in her mother; Thyroid cancer in her maternal grandmother. Allergies  Allergen Reactions   Tramadol Itching   Latex Other (See Comments)    Causes irritation.    Review of Systems  Constitutional:  Positive for malaise/fatigue and weight loss. Negative for chills and fever.  Eyes:  Negative for blurred vision.  Respiratory:  Negative for cough and shortness of breath.   Cardiovascular:  Negative for chest pain and leg swelling.  Gastrointestinal:  Positive for abdominal pain and constipation. Negative for blood in stool, diarrhea, heartburn and melena.  Genitourinary:  Negative for dysuria and hematuria.  Neurological:  Negative for headaches.  Psychiatric/Behavioral:  The patient has insomnia.       Objective:     BP 100/70 (BP Location: Left Arm, Patient Position: Sitting, Cuff Size: Normal)   Pulse 80   Temp (!) 100.7 F (38.2 C) (Oral)   Ht '5\' 8"'$  (1.727 m)   Wt 126 lb 14.4 oz (57.6 kg)   LMP 04/23/2021 (Exact Date)   SpO2 98%   BMI 19.30 kg/m  BP Readings from Last 3 Encounters:  01/04/22 100/70  11/13/21 110/60  10/17/21 134/90   Wt Readings from Last 3 Encounters:  01/04/22 126 lb 14.4 oz (57.6 kg)  11/13/21 128 lb 6.4 oz (58.2 kg)  10/17/21 129 lb 12.8 oz (58.9 kg)      Physical Exam Vitals reviewed.  HENT:     Mouth/Throat:     Mouth: Mucous membranes are moist.     Pharynx: Oropharynx is clear.   Neck:     Comments: She has palpable nodule left lobe of thyroid and some fullness in the right lobe as well. Cardiovascular:     Rate and Rhythm: Normal rate and regular rhythm.     Heart sounds: No murmur heard. Pulmonary:     Effort: Pulmonary effort is normal.     Breath sounds: Normal breath sounds. No wheezing or rales.  Abdominal:     General: There is no distension.     Palpations: Abdomen is soft. There is no mass.     Tenderness: There is no abdominal tenderness.  Musculoskeletal:     Cervical back: Neck supple.     Right lower leg: No edema.     Left lower leg: No edema.  Skin:    Findings: No rash.  Neurological:     General: No focal deficit present.     Mental Status: She is alert.      No results found for any visits on 01/04/22.  Last CBC Lab Results  Component Value Date   WBC 8.3 10/10/2021  HGB 13.4 10/10/2021   HCT 39.2 10/10/2021   MCV 92.7 10/10/2021   MCH 29.0 04/24/2021   RDW 17.4 (H) 10/10/2021   PLT 265.0 93/71/6967   Last metabolic panel Lab Results  Component Value Date   GLUCOSE 83 07/19/2021   NA 140 07/19/2021   K 3.9 07/19/2021   CL 104 07/19/2021   CO2 28 07/19/2021   BUN 8 07/19/2021   CREATININE 0.75 07/19/2021   GFRNONAA >60 04/24/2021   CALCIUM 9.3 07/19/2021   PROT 6.9 07/19/2021   ALBUMIN 4.6 07/19/2021   BILITOT 0.4 07/19/2021   ALKPHOS 53 07/19/2021   AST 17 07/19/2021   ALT 16 07/19/2021   ANIONGAP 6 04/24/2021   Last lipids No results found for: "CHOL", "HDL", "LDLCALC", "LDLDIRECT", "TRIG", "CHOLHDL" Last hemoglobin A1c Lab Results  Component Value Date   HGBA1C 5.5 12/29/2020   Last thyroid functions Lab Results  Component Value Date   TSH 1.93 10/10/2021   T4TOTAL 5.9 10/10/2021   Last vitamin D No results found for: "25OHVITD2", "25OHVITD3", "VD25OH" Last vitamin B12 and Folate Lab Results  Component Value Date   ELFYBOFB51 025 07/19/2021      The ASCVD Risk score (Arnett DK, et al.,  2019) failed to calculate for the following reasons:   The 2019 ASCVD risk score is only valid for ages 33 to 39    Assessment & Plan:   #1 weight loss.  Patient has had extensive GI work-up as above with upper endoscopy, lower endoscopy, CT scan, multiple labs with no clear etiology.  No evidence for inflammatory bowel disease by previous evaluation.  Recent sed rate 1.  Does appear that her weight loss is leveling off some.  Pending testing for SIBO.    #2 history of iron deficiency anemia with negative GI work-up as above.  Recent hemoglobin normal.  Patient had 1 iron infusion.  She is requesting repeat ferritin and TIBC and iron and these are drawn today.  Negative previous work-up for celiac disease.  #3 recurrent aphthous ulcers.  We discussed possible prophylactic trial of once daily colchicine because of her severe and frequent recurrence of aphthous ulcers  #4 bipolar disorder managed by psychiatry and stable.  #5 pancreatic exocrine insufficiency.  Patient symptomatically improved after she started pancreas enzymes and this is managed by GI.  #6 migraine headaches which are relatively stable on Aimovig  #7 intermittent low back pain with history of lumbar spondylosis and prior spinal fusion lumbar region.   No follow-ups on file.    Carolann Littler, MD

## 2022-01-05 ENCOUNTER — Encounter: Payer: Self-pay | Admitting: Family Medicine

## 2022-01-05 LAB — IRON AND TIBC
Iron Saturation: 31 % (ref 15–55)
Iron: 76 ug/dL (ref 27–159)
Total Iron Binding Capacity: 244 ug/dL — ABNORMAL LOW (ref 250–450)
UIBC: 168 ug/dL (ref 131–425)

## 2022-01-08 MED ORDER — COLCHICINE 0.6 MG PO TABS: ORAL_TABLET | ORAL | 5 refills | Status: DC

## 2022-01-08 NOTE — Addendum Note (Signed)
Addended by: Eulas Post on: 01/08/2022 05:16 PM   Modules accepted: Orders

## 2022-01-16 ENCOUNTER — Telehealth (HOSPITAL_COMMUNITY): Payer: Self-pay | Admitting: Pharmacy Technician

## 2022-01-16 NOTE — Telephone Encounter (Signed)
Pt called an informed that the PA team is working on her [place] PA

## 2022-01-16 NOTE — Telephone Encounter (Addendum)
Patient Advocate Encounter   Received notification that prior authorization for Aimovig '140MG'$ /ML auto-injectors is required.   PA submitted on 01/16/2022 Key [id number] Prior Auth (EOC) ID:  [id number] Sheralyn Boatman) Status is pending       Lyndel Safe, [place] Patient Advocate Specialist [place] Patient Advocate Team Direct Number: [phone]  Fax: [phone]

## 2022-01-16 NOTE — Telephone Encounter (Signed)
Patient called about this medication PA. She said she is due to take her next dosage on Saturday, 01/19/22.

## 2022-01-17 ENCOUNTER — Encounter: Payer: Self-pay | Admitting: Family Medicine

## 2022-01-18 NOTE — Telephone Encounter (Signed)
Pt called informed we are still waiting to hear back from her insurance and that on Monday she can come by and get a sample of aimovig while we are waiting,

## 2022-01-18 NOTE — Telephone Encounter (Signed)
Patient called to see about this. She is due for her next dose tomorrow. She'd like to know if we have any samples if the auth is not done yet.

## 2022-01-21 ENCOUNTER — Telehealth: Payer: Self-pay | Admitting: Neurology

## 2022-01-21 ENCOUNTER — Other Ambulatory Visit (HOSPITAL_COMMUNITY): Payer: Self-pay

## 2022-01-21 NOTE — Telephone Encounter (Signed)
Pt called in stating she is supposed to be coming in to pick up a sample of Aimovig due to the PA not being approved yet. She tried to call the insurance company over the weekend and just got the run around. She would like to double check on the status.

## 2022-01-21 NOTE — Telephone Encounter (Signed)
LMOVM for patient, No new updates. Please allow at least 2 weeks to receive a response.

## 2022-01-22 ENCOUNTER — Ambulatory Visit: Payer: 59 | Admitting: Family Medicine

## 2022-01-22 VITALS — BP 100/70 | HR 75 | Temp 98.4°F | Ht 68.0 in | Wt 124.8 lb

## 2022-01-22 DIAGNOSIS — J029 Acute pharyngitis, unspecified: Secondary | ICD-10-CM | POA: Diagnosis not present

## 2022-01-22 LAB — POC COVID19 BINAXNOW: SARS Coronavirus 2 Ag: NEGATIVE

## 2022-01-22 LAB — POCT RAPID STREP A (OFFICE): Rapid Strep A Screen: NEGATIVE

## 2022-01-22 NOTE — Telephone Encounter (Signed)
Pt called an informed that someone from the front will be calling her to get her appointment moved earlier and that if we need to we can give her more samples

## 2022-01-22 NOTE — Telephone Encounter (Signed)
Documentation from the patient's medical record (e.g. chart notes, a letter of medical necessity) from the past 12 months supporting the patient's having experienced a positive response to therapy with the requested medication.    I have sent the chart notes from 09/10/2021 but they want more recent ones.  Lyndel Safe, [place] Patient Advocate Specialist [place] Patient Advocate Team Direct Number: [phone]  Fax: [phone]
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
## 2022-01-22 NOTE — Progress Notes (Signed)
Established Patient Office Visit  Subjective   Patient ID: Rebecca Holt, female    DOB: [birth year]  Age: 36 y.o. MRN: [id number]  Chief Complaint  Patient presents with   Sore Throat    Patient complains of sore throat, x3 days, Tried Aleve and Tylenol with little relief     HPI   Rebecca Holt is here with sore throat for the past 3 days with reported low-grade fever up to 100.8.  None today.  Denies any sick contacts.  No congestion.  No cough.  She has had previous tonsillectomy.  She has upcoming hydrogen breath test scheduled at [facility] next week to rule out SIBO. She does have history of recurrent aphthous ulcers and we recently placed her on low-dose colchicine to hopefully reduce incidence.  She has not seen any aphthous ulcers thus far.  Past Medical History:  Diagnosis Date   Anxiety    Arthritis    si joints   Bipolar disorder ([place])    Chicken pox    as child   Depressive disorder 02/08/2019   History of COVID-19 09/09/2020   g i issues x 2-3 weeks all symptoms reolved   History of hypothyroidism    yrs ago per pt on 04-17-2021   Pneumonia    yrs ago per pt on 04-17-2021   Wears glasses    Past Surgical History:  Procedure Laterality Date   ANTERIOR AND POSTERIOR REPAIR N/A 04/23/2021   Procedure: POSTERIOR REPAIR (RECTOCELE);  Surgeon: Paula Compton, MD;  Location: [facility];  Service: Gynecology;  Laterality: N/A;   BIOPSY  07/25/2021   Procedure: BIOPSY;  Surgeon: [name], MD;  Location: [facility];  Service: Gastroenterology;;   COLONOSCOPY WITH PROPOFOL N/A 07/25/2021   Procedure: COLONOSCOPY WITH PROPOFOL;  Surgeon: [name], MD;  Location: [facility];  Service: Gastroenterology;  Laterality: N/A;   CYSTOSCOPY N/A 04/23/2021   Procedure: CYSTOSCOPY;  Surgeon: Paula Compton, MD;  Location: [facility];  Service: Gynecology;  Laterality: N/A;   ESOPHAGOGASTRODUODENOSCOPY (EGD) WITH PROPOFOL N/A 07/25/2021    Procedure: ESOPHAGOGASTRODUODENOSCOPY (EGD) WITH PROPOFOL;  Surgeon: [name], MD;  Location: [facility];  Service: Gastroenterology;  Laterality: N/A;   LAPAROSCOPIC VAGINAL HYSTERECTOMY WITH SALPINGECTOMY Bilateral 04/23/2021   Procedure: LAPAROSCOPIC ASSISTED VAGINAL HYSTERECTOMY WITH BILATERAL  SALPINGECTOMY;  Surgeon: Paula Compton, MD;  Location: [place];  Service: Gynecology;  Laterality: Bilateral;   TONSILLECTOMY Bilateral 2020   TRANSFORAMINAL LUMBAR INTERBODY FUSION (TLIF) WITH PEDICLE SCREW FIXATION 1 LEVEL N/A 05/07/2019   Procedure: Lumbar Five- Sacral One Transforaminal lumbar interbody Fusion;  Surgeon: Erline Levine, MD;  Location: [place];  Service: Neurosurgery;  Laterality: N/A;  Lumbar 5 Sacral 1 Transforaminal lumbar interbody fusion    reports that she has never smoked. She has never used smokeless tobacco. She reports that she does not drink alcohol and does not use drugs. family history includes Bipolar disorder in her mother and son; Cancer in her maternal grandfather and paternal grandfather; Diabetes in her father and paternal grandfather; Diabetic kidney disease in her father; Hyperlipidemia in her father; Mental illness in her mother; Thyroid cancer in her maternal grandmother. Allergies  Allergen Reactions   Tramadol Itching   Latex Other (See Comments)    Causes irritation.    Review of Systems  Constitutional:  Positive for fever and malaise/fatigue.  HENT:  Positive for sore throat. Negative for congestion.   Respiratory:  Negative for cough.  Objective:     BP 100/70 (BP Location: Left Arm, Patient Position: Sitting, Cuff Size: Normal)   Pulse 75   Temp 98.4 F (36.9 C) (Oral)   Ht '5\' 8"'$  (1.727 m)   Wt 124 lb 12.8 oz (56.6 kg)   LMP 04/23/2021 (Exact Date)   SpO2 99%   BMI 18.98 kg/m    Physical Exam Vitals reviewed.  Constitutional:      General: She is not in acute distress.    Appearance: She is  well-developed. She is not ill-appearing.  HENT:     Right Ear: Tympanic membrane normal.     Left Ear: Tympanic membrane normal.     Mouth/Throat:     Mouth: Mucous membranes are moist.     Comments: No significant erythema.  No exudate.  Previous bilateral tonsillectomy Cardiovascular:     Rate and Rhythm: Normal rate and regular rhythm.  Pulmonary:     Effort: Pulmonary effort is normal.     Breath sounds: No wheezing or rales.  Musculoskeletal:     Cervical back: Neck supple.  Lymphadenopathy:     Cervical: No cervical adenopathy.  Neurological:     Mental Status: She is alert.      Results for orders placed or performed in visit on 01/22/22  POC COVID-19  Result Value Ref Range   SARS Coronavirus 2 Ag Negative Negative  POC Rapid Strep A  Result Value Ref Range   Rapid Strep A Screen Negative Negative      The ASCVD Risk score (Arnett DK, et al., 2019) failed to calculate for the following reasons:   The 2019 ASCVD risk score is only valid for ages 41 to 30    Assessment & Plan:   Problem List Items Addressed This Visit   None Visit Diagnoses     Sorethroat    -  Primary   Relevant Orders   POC COVID-19 (Completed)   POC Rapid Strep A (Completed)     Sore throat with negative COVID screen and negative rapid strep.  Fairly low clinical suspicion for strep based on exam and above.  Treat symptomatically with over-the-counter Tylenol and/or ibuprofen and salt water gargles.  Follow-up for any persistent or worsening symptoms.  Need to avoid antibiotics in the absence of any clear strep especially in view of her upcoming scheduled hydrogen breath test at [facility] next week  No follow-ups on file.    Carolann Littler, MD

## 2022-01-23 ENCOUNTER — Encounter: Payer: Self-pay | Admitting: Neurology

## 2022-01-23 ENCOUNTER — Ambulatory Visit: Payer: 59 | Admitting: Neurology

## 2022-01-23 VITALS — BP 115/76 | HR 93 | Ht 68.0 in | Wt 125.6 lb

## 2022-01-23 DIAGNOSIS — G43009 Migraine without aura, not intractable, without status migrainosus: Secondary | ICD-10-CM | POA: Diagnosis not present

## 2022-02-04 NOTE — Telephone Encounter (Signed)
Documentation has been faxed to insurance.  Waiting to hear back.  Will continue to update.  Lyndel Safe, [place] Patient Advocate Specialist [place] Patient Advocate Team Direct Number: [phone]  Fax: [phone]

## 2022-02-05 MED ORDER — AIMOVIG 140 MG/ML ~~LOC~~ SOAJ: 140.0000 mg | SUBCUTANEOUS | 5 refills | Status: DC

## 2022-02-05 NOTE — Addendum Note (Signed)
Addended by: Venetia Night on: 02/05/2022 11:05 AM   Modules accepted: Orders

## 2022-02-05 NOTE — Telephone Encounter (Signed)
Patient Advocate Encounter  Prior Authorization for Aimovig '140MG'$ /ML auto-injectors has been approved.     Effective dates: 02/04/2022 through 08/069/2024      Lyndel Safe, [place] Patient Advocate Specialist [place] Patient Advocate Team Direct Number: [phone]  Fax: [phone]

## 2022-02-12 ENCOUNTER — Ambulatory Visit (INDEPENDENT_AMBULATORY_CARE_PROVIDER_SITE_OTHER): Payer: 59 | Admitting: Psychiatry

## 2022-02-12 ENCOUNTER — Encounter: Payer: Self-pay | Admitting: Psychiatry

## 2022-02-12 DIAGNOSIS — G4700 Insomnia, unspecified: Secondary | ICD-10-CM

## 2022-02-12 DIAGNOSIS — F39 Unspecified mood [affective] disorder: Secondary | ICD-10-CM

## 2022-02-12 DIAGNOSIS — F410 Panic disorder [episodic paroxysmal anxiety] without agoraphobia: Secondary | ICD-10-CM | POA: Diagnosis not present

## 2022-02-12 MED ORDER — BUPROPION HCL ER (XL) 150 MG PO TB24: 150.0000 mg | ORAL_TABLET | Freq: Every day | ORAL | 1 refills | Status: DC

## 2022-02-12 MED ORDER — ALPRAZOLAM 0.5 MG PO TABS: ORAL_TABLET | ORAL | 5 refills | Status: DC

## 2022-02-12 MED ORDER — MODAFINIL 200 MG PO TABS: ORAL_TABLET | ORAL | 3 refills | Status: DC

## 2022-02-12 NOTE — Progress Notes (Signed)
Rebecca Holt [id number] 1986/05/01 36 y.o.  Subjective:   Patient ID:  Rebecca Holt is a 35 y.o. (DOB Feb 29, 1986) female.  Chief Complaint:  Chief Complaint  Patient presents with   Anxiety   Depression    HPI Rebecca Holt presents to the office today for follow-up of anxiety, mood disturbance, and insomnia. Rebecca Holt reports that Rebecca Holt has been "super stressed out and irritated and agitated." Rebecca Holt reports that her mood has been down. Rebecca Holt has also been anxious. Has some obsessive thoughts and rumination about past marital issue. Rebecca Holt reports that Rebecca Holt has days where energy is very low. Rebecca Holt reports that some days Rebecca Holt will feel "ok" when Rebecca Holt first wakes up until about mid-day to early afternoon when energy and mood decline. Rebecca Holt reports around 2 pm "I face plant" regardless of caffeine or food intake. Rebecca Holt reports poor concentration and word finding difficulties. Minimal interest and enjoyment in things. Denies impulsive or risky behavior. Denies excessive spending. Denies SI.   Rebecca Holt reports Modafinil has been very helpful for her mood.   Rebecca Holt reports that "food is a huge challenge." Rebecca Holt reports that Rebecca Holt will get hungry and eat, then it may cause severe GI s/s. Appetite "is there." Rebecca Holt has been trying to determine what causes GI flares. Has been eating the same 5 few foods repeatedly. Rebecca Holt has had various health issues. Se has had iron infusions and hysterectomy. Rebecca Holt reports different medication tests have been negative. Completed pelvic floor therapy. Has been referred to rheumatology and sees dermatology soon.   Rebecca Holt reports poor sleep and will take 1/2 tab of Xanax  nightly. Difficulty falling and staying asleep. Describes sleeping 6 hours a night with multiple interruptions.   Her children have been out of school until 8/28.   Currently has 2 birds.   Xanax last filled 02/05/22. Modafinil last filled 01/16/22.   Past Psychiatric Medication Trials: Ativan- partially effective Tranxene          Wellbutrin  XL- Increased activation/agitation with 450 mg. Reports that Wellbutrin seemed to initially be effective.   Zoloft Lexapro Celexa- Had severe discontinuation Pristiq Abilify Rexulti- Cost prohibitive  Olanzapine Risperidone- Involuntary orofacial movements Trileptal Lamictal- Had increased irritability and insomnia with 100 mg dose.  Ambien Modafinil- Helpful for mood.    [place] Office Visit from 06/05/2020 in [place] Total Score 0      PHQ2-9    [place] Office Visit from 01/04/2022 in [place] [facility] [place]  PHQ-2 Total Score 2  PHQ-9 Total Score 14      Flowsheet Row Admission (Discharged) from 07/25/2021 in [facility] ENDOSCOPY Admission (Discharged) from 04/23/2021 in [place] No Risk No Risk        Review of Systems:  Review of Systems  Gastrointestinal:  Positive for constipation. Negative for nausea and vomiting.       Flatulence  Musculoskeletal:  Negative for gait problem.  Neurological:  Negative for tremors.  Psychiatric/Behavioral:         Please refer to HPI    Medications: I have reviewed the patient's current medications.  Current Outpatient Medications  Medication Sig Dispense Refill   B Complex-C (B-COMPLEX WITH VITAMIN C) tablet Take 1 tablet by mouth daily.     cholecalciferol (VITAMIN D3) 25 MCG (1000 UNIT) tablet Take 1,000 Units by mouth daily.     colchicine 0.6 MG tablet Take one tablet by mouth once daily 30 tablet  5   Erenumab-aooe (AIMOVIG) 140 MG/ML SOAJ Inject 140 mg into the skin every 28 (twenty-eight) days. 1.12 mL 5   lamoTRIgine (LAMICTAL) 25 MG tablet TAKE THREE TABLETS BY MOUTH DAILY 90 tablet 2   LYSINE PO Take 3 tablets by mouth in the morning.     rizatriptan (MAXALT-MLT) 10 MG disintegrating tablet Take 1 tablet (10 mg total) by mouth as needed for migraine (may repeat after 2 hours.  Maximum 2 tablets in 24 hours.). May  repeat in 2 hours if needed 9 tablet 5   ZENPEP 40000-126000 units CPEP Take 1-2 capsules by mouth See admin instructions. Take 1 capsule by mouth with each snack & take 2 capsules by mouth with each meal     [START ON 03/05/2022] ALPRAZolam (XANAX) 0.5 MG tablet TAKE ONE TABLET BY MOUTH TWO TIMES A DAY AS NEEDED FOR ANXIETY 45 tablet 5   buPROPion (WELLBUTRIN XL) 150 MG 24 hr tablet Take 1 tablet (150 mg total) by mouth daily. 90 tablet 1   modafinil (PROVIGIL) 200 MG tablet Take 1 tablet (200 mg total) by mouth every morning AND 0.5 tablets (100 mg total) daily at 12 noon. 45 tablet 3   ondansetron (ZOFRAN-ODT) 4 MG disintegrating tablet Take 4 mg by mouth every 8 (eight) hours as needed for vomiting or nausea.     No current facility-administered medications for this visit.    Medication Side Effects: None  Allergies:  Allergies  Allergen Reactions   Tramadol Itching   Latex Other (See Comments)    Causes irritation.    Past Medical History:  Diagnosis Date   Anxiety    Arthritis    si joints   Bipolar disorder ([place])    Chicken pox    as child   Depressive disorder 02/08/2019   History of COVID-19 09/09/2020   g i issues x 2-3 weeks all symptoms reolved   History of hypothyroidism    yrs ago per pt on 04-17-2021   Pneumonia    yrs ago per pt on 04-17-2021   Wears glasses     Past Medical History, Surgical history, Social history, and Family history were reviewed and updated as appropriate.   Please see review of systems for further details on the patient's review from today.   Objective:   Physical Exam:  LMP 04/23/2021 (Exact Date)   Physical Exam Constitutional:      General: Rebecca Holt is not in acute distress. Musculoskeletal:        General: No deformity.  Neurological:     Mental Status: Rebecca Holt is alert and oriented to person, place, and time.     Coordination: Coordination normal.  Psychiatric:        Attention and Perception: Attention and perception normal. Rebecca Holt  does not perceive auditory or visual hallucinations.        Mood and Affect: Mood is anxious and depressed. Affect is not labile, blunt, angry or inappropriate.        Speech: Speech normal.        Behavior: Behavior normal.        Thought Content: Thought content normal. Thought content is not paranoid or delusional. Thought content does not include homicidal or suicidal ideation. Thought content does not include homicidal or suicidal plan.        Cognition and Memory: Cognition and memory normal.        Judgment: Judgment normal.     Comments: Insight intact     Lab Review:  Component Value Date/Time   NA 140 07/19/2021 1037   NA 141 02/02/2020 0000   K 3.9 07/19/2021 1037   CL 104 07/19/2021 1037   CO2 28 07/19/2021 1037   GLUCOSE 83 07/19/2021 1037   BUN 8 07/19/2021 1037   BUN 10 02/02/2020 0000   CREATININE 0.75 07/19/2021 1037   CALCIUM 9.3 07/19/2021 1037   PROT 6.9 07/19/2021 1037   ALBUMIN 4.6 07/19/2021 1037   AST 17 07/19/2021 1037   ALT 16 07/19/2021 1037   ALKPHOS 53 07/19/2021 1037   BILITOT 0.4 07/19/2021 1037   GFRNONAA >60 04/24/2021 0145   GFRAA 90.96 02/02/2020 0000       Component Value Date/Time   WBC 8.3 10/10/2021 1245   RBC 4.23 10/10/2021 1245   HGB 13.4 10/10/2021 1245   HCT 39.2 10/10/2021 1245   PLT 265.0 10/10/2021 1245   MCV 92.7 10/10/2021 1245   MCH 29.0 04/24/2021 0145   MCHC 34.1 10/10/2021 1245   RDW 17.4 (H) 10/10/2021 1245   LYMPHSABS 0.9 10/10/2021 1245   MONOABS 0.4 10/10/2021 1245   EOSABS 0.0 10/10/2021 1245   BASOSABS 0.0 10/10/2021 1245    No results found for: "POCLITH", "LITHIUM"   No results found for: "PHENYTOIN", "PHENOBARB", "VALPROATE", "CBMZ"   .res Assessment: Plan:    Patient seen for 30 minutes and time spent reviewing changes in medical and social history since last visit.  Rebecca Holt reports that modafinil has been very helpful for her mood, energy and concentration, however Rebecca Holt notices a drop in mood and  energy by the early afternoon.  Discussed adding an additional 100 mg of modafinil mid day to possibly prevent drop in energy and mood in the early afternoon.  Patient is in agreement with this plan. Discussed considering Latuda for treatment of mood symptoms if mood symptoms do not improve or worsen.  Discussed that Anette Guarneri is now available in generic.  Rebecca Holt reports that Rebecca Holt would first like to start increase in modafinil and then consider Latuda if needed in the future. Will continue Wellbutrin XL 150 mg daily for depression. Will continue Xanax 0.5 mg 1 tablet twice daily as needed for anxiety. Also discussed considering restarting therapy if needed. Pt to follow-up in 2 months or sooner if clinically indicated.  Patient advised to contact office with any questions, adverse effects, or acute worsening in signs and symptoms.   [place] was seen today for anxiety and depression.  Diagnoses and all orders for this visit:  Insomnia, unspecified type -     ALPRAZolam (XANAX) 0.5 MG tablet; TAKE ONE TABLET BY MOUTH TWO TIMES A DAY AS NEEDED FOR ANXIETY  Panic attacks -     ALPRAZolam (XANAX) 0.5 MG tablet; TAKE ONE TABLET BY MOUTH TWO TIMES A DAY AS NEEDED FOR ANXIETY  Episodic mood disorder (HCC) -     buPROPion (WELLBUTRIN XL) 150 MG 24 hr tablet; Take 1 tablet (150 mg total) by mouth daily.  Other orders -     modafinil (PROVIGIL) 200 MG tablet; Take 1 tablet (200 mg total) by mouth every morning AND 0.5 tablets (100 mg total) daily at 12 noon.     Please see After Visit Summary for patient specific instructions.  Future Appointments  Date Time Provider [place]  04/15/2022  8:30 AM Thayer Headings, PMHNP [facility] None  10/28/2022  9:10 AM Pieter Partridge, DO [facility] None    No orders of the defined types were placed in this encounter.   -------------------------------

## 2022-02-13 ENCOUNTER — Ambulatory Visit: Payer: 59 | Admitting: Psychiatry

## 2022-02-15 ENCOUNTER — Other Ambulatory Visit: Payer: Self-pay | Admitting: Psychiatry

## 2022-02-20 ENCOUNTER — Ambulatory Visit: Payer: 59 | Admitting: Psychiatry

## 2022-02-21 ENCOUNTER — Other Ambulatory Visit: Payer: Self-pay | Admitting: Psychiatry

## 2022-02-21 DIAGNOSIS — F39 Unspecified mood [affective] disorder: Secondary | ICD-10-CM

## 2022-03-01 ENCOUNTER — Telehealth: Payer: Self-pay | Admitting: Neurology

## 2022-03-01 NOTE — Telephone Encounter (Signed)
How frequent or the headaches (on average, how many days a week/month are they occurring)?  4 a week  How long do the headaches last?  All day since Monday Verify what preventative medication and dose you are taking (e.g. topiramate, propranolol, amitriptyline, Emgality, etc)  Rizatriptan she is taken one a day and tylenol and Aleve.  Verify which rescue medication you are taking (triptan, Advil, Excedrin, Aleve, Ubrelvy, etc)  Aleve 220 mg Tylenol 1000 mg. Some day both  How often are you taking pain relievers/analgesics/rescue mediction?  Every day this week   She said she could wait until Dr. Tomi Likens comes back on Tuesday

## 2022-03-01 NOTE — Telephone Encounter (Signed)
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 She is not sure if it's a fluke, but wanted Dr. Tomi Likens to be aware and see if she should do anything in particular.

## 2022-03-05 MED ORDER — PREDNISONE 10 MG (21) PO TBPK: ORAL_TABLET | ORAL | 0 refills | Status: DC

## 2022-03-05 NOTE — Telephone Encounter (Signed)
Patient advised, It is likely a fluke, possibly related to the weather.  To break current cycle of daily headache, we can prescribe a prednisone taper - if agreeable, send prescription for '10mg'$  tablet - '60mg'$  daily 1, then '50mg'$  on day 2, then '40mg'$  on day 3, then '30mg'$  on day 4, then '20mg'$  on day 5, then '10mg'$  on day 6, then stop.

## 2022-03-05 NOTE — Telephone Encounter (Signed)
Swetha called to give an update.

## 2022-03-14 ENCOUNTER — Ambulatory Visit: Payer: 59 | Admitting: Neurology

## 2022-03-29 ENCOUNTER — Ambulatory Visit (INDEPENDENT_AMBULATORY_CARE_PROVIDER_SITE_OTHER): Payer: 59 | Admitting: Family Medicine

## 2022-03-29 VITALS — BP 112/76 | HR 93 | Temp 99.1°F | Wt 124.1 lb

## 2022-03-29 DIAGNOSIS — J209 Acute bronchitis, unspecified: Secondary | ICD-10-CM

## 2022-03-29 MED ORDER — HYDROCOD POLI-CHLORPHE POLI ER 10-8 MG/5ML PO SUER
5.0000 mL | Freq: Every evening | ORAL | 0 refills | Status: DC | PRN
Start: 2022-03-29 — End: 2022-05-07

## 2022-03-29 NOTE — Progress Notes (Signed)
Established Patient Office Visit  Subjective   Patient ID: Daryl Quiros, female    DOB: [birth year]  Age: 36 y.o. MRN: [id number]  Chief Complaint  Patient presents with   Cough    Cough since Saturday, but has gotten worse the last couple days.  Had aches and pains for the first few days. Took covid test and it was neg.    Abdominal Pain    GI treating     HPI   Rebecca Holt is seen with cough which started last Saturday.  Is gotten worse past few days especially.  She has tried several things over-the-counter including Mucinex, Robitussin, and leftover Tessalon Perles prescription but none of them have helped.  Severe cough at night.  Not resting well.  She states she is taken Cheratussin AC and Hycodan in the past which have not worked particularly well.  She states she had fever 101 initially last weekend but none since then.  Home COVID test negative.  Denies any sore throat.  No dyspnea.  No wheezing.  Some postnasal drainage.  Some colored mucus production.  She is a non-smoker.  She has chronic GI issues and is followed closely by GI.  Recently started Xifaxan.  Has gained a few pounds from low weight of 121 pounds to current weight 124 pounds  Past Medical History:  Diagnosis Date   Anxiety    Arthritis    si joints   Bipolar disorder ([place])    Chicken pox    as child   Depressive disorder 02/08/2019   History of COVID-19 09/09/2020   g i issues x 2-3 weeks all symptoms reolved   History of hypothyroidism    yrs ago per pt on 04-17-2021   Pneumonia    yrs ago per pt on 04-17-2021   Wears glasses    Past Surgical History:  Procedure Laterality Date   ANTERIOR AND POSTERIOR REPAIR N/A 04/23/2021   Procedure: POSTERIOR REPAIR (RECTOCELE);  Surgeon: Paula Compton, MD;  Location: [facility];  Service: Gynecology;  Laterality: N/A;   BIOPSY  07/25/2021   Procedure: BIOPSY;  Surgeon: [name], MD;  Location: [facility];  Service: Gastroenterology;;    COLONOSCOPY WITH PROPOFOL N/A 07/25/2021   Procedure: COLONOSCOPY WITH PROPOFOL;  Surgeon: [name], MD;  Location: [facility];  Service: Gastroenterology;  Laterality: N/A;   CYSTOSCOPY N/A 04/23/2021   Procedure: CYSTOSCOPY;  Surgeon: Paula Compton, MD;  Location: [facility];  Service: Gynecology;  Laterality: N/A;   ESOPHAGOGASTRODUODENOSCOPY (EGD) WITH PROPOFOL N/A 07/25/2021   Procedure: ESOPHAGOGASTRODUODENOSCOPY (EGD) WITH PROPOFOL;  Surgeon: [name], MD;  Location: [facility];  Service: Gastroenterology;  Laterality: N/A;   LAPAROSCOPIC VAGINAL HYSTERECTOMY WITH SALPINGECTOMY Bilateral 04/23/2021   Procedure: LAPAROSCOPIC ASSISTED VAGINAL HYSTERECTOMY WITH BILATERAL  SALPINGECTOMY;  Surgeon: Paula Compton, MD;  Location: [place];  Service: Gynecology;  Laterality: Bilateral;   TONSILLECTOMY Bilateral 2020   TRANSFORAMINAL LUMBAR INTERBODY FUSION (TLIF) WITH PEDICLE SCREW FIXATION 1 LEVEL N/A 05/07/2019   Procedure: Lumbar Five- Sacral One Transforaminal lumbar interbody Fusion;  Surgeon: Erline Levine, MD;  Location: [place];  Service: Neurosurgery;  Laterality: N/A;  Lumbar 5 Sacral 1 Transforaminal lumbar interbody fusion    reports that she has never smoked. She has never used smokeless tobacco. She reports that she does not drink alcohol and does not use drugs. family history includes Bipolar disorder in her mother and son; Cancer in her maternal grandfather and paternal grandfather; Diabetes in her  father and paternal grandfather; Diabetic kidney disease in her father; Hyperlipidemia in her father; Mental illness in her mother; Thyroid cancer in her maternal grandmother. Allergies  Allergen Reactions   Tramadol Itching   Latex Other (See Comments)    Causes irritation.    Review of Systems  Constitutional:  Negative for chills and fever.  HENT:  Positive for congestion. Negative for ear pain, sinus pain and sore throat.    Respiratory:  Positive for cough. Negative for hemoptysis, shortness of breath and wheezing.       Objective:     BP 112/76 (BP Location: Left Arm, Patient Position: Sitting, Cuff Size: Normal)   Pulse 93   Temp 99.1 F (37.3 C) (Oral)   Wt 124 lb 1.6 oz (56.3 kg)   LMP 04/23/2021 (Exact Date)   SpO2 98%   BMI 18.87 kg/m    Physical Exam Vitals reviewed.  Constitutional:      General: She is not in acute distress.    Appearance: She is well-developed. She is not ill-appearing.  HENT:     Mouth/Throat:     Mouth: Mucous membranes are moist.     Pharynx: Oropharynx is clear. No oropharyngeal exudate.  Cardiovascular:     Rate and Rhythm: Normal rate and regular rhythm.  Pulmonary:     Effort: Pulmonary effort is normal.     Breath sounds: Normal breath sounds. No wheezing or rales.  Neurological:     Mental Status: She is alert.      No results found for any visits on 03/29/22.    The ASCVD Risk score (Arnett DK, et al., 2019) failed to calculate for the following reasons:   The 2019 ASCVD risk score is only valid for ages 83 to 16    Assessment & Plan:   Cough.  Suspect acute viral bronchitis.  Home COVID test negative.  Nonfocal exam.  She is having severe nighttime cough not relieved with over-the-counter medications  -We wrote for limited Tussionex 1 teaspoon nightly as needed severe cough.  She is aware of this will likely cause some sedation -Continue Mucinex -Follow-up immediately for any fever, increased shortness of breath, or other concerns  No follow-ups on file.    Carolann Littler, MD

## 2022-03-30 ENCOUNTER — Emergency Department (HOSPITAL_COMMUNITY)
Admission: EM | Admit: 2022-03-30 | Discharge: 2022-03-30 | Disposition: A | Discharge status: Home / Self Care | Payer: 59 | Attending: Emergency Medicine | Admitting: Emergency Medicine

## 2022-03-30 ENCOUNTER — Other Ambulatory Visit: Payer: Self-pay

## 2022-03-30 ENCOUNTER — Emergency Department (HOSPITAL_COMMUNITY): Payer: 59

## 2022-03-30 ENCOUNTER — Encounter (HOSPITAL_COMMUNITY): Payer: Self-pay

## 2022-03-30 DIAGNOSIS — Z79899 Other long term (current) drug therapy: Secondary | ICD-10-CM | POA: Diagnosis not present

## 2022-03-30 DIAGNOSIS — E039 Hypothyroidism, unspecified: Secondary | ICD-10-CM | POA: Insufficient documentation

## 2022-03-30 DIAGNOSIS — J209 Acute bronchitis, unspecified: Secondary | ICD-10-CM | POA: Diagnosis not present

## 2022-03-30 DIAGNOSIS — Z20822 Contact with and (suspected) exposure to covid-19: Secondary | ICD-10-CM | POA: Diagnosis not present

## 2022-03-30 DIAGNOSIS — Z91040 Latex allergy status: Secondary | ICD-10-CM | POA: Insufficient documentation

## 2022-03-30 DIAGNOSIS — R059 Cough, unspecified: Secondary | ICD-10-CM | POA: Diagnosis present

## 2022-03-30 LAB — RESP PANEL BY RT-PCR (FLU A&B, COVID) ARPGX2
Influenza A by PCR: NEGATIVE
Influenza B by PCR: NEGATIVE
SARS Coronavirus 2 by RT PCR: NEGATIVE

## 2022-03-30 MED ORDER — PREDNISONE 20 MG PO TABS
40.0000 mg | ORAL_TABLET | Freq: Once | ORAL | Status: AC
  Administered 2022-03-30: 40 mg via ORAL
  Filled 2022-03-30: qty 2

## 2022-03-30 MED ORDER — PREDNISONE 20 MG PO TABS: 40.0000 mg | ORAL_TABLET | Freq: Every day | ORAL | 0 refills | Status: DC

## 2022-03-30 NOTE — ED Provider Notes (Signed)
[place] DEPT Provider Note   CSN: [id number] Arrival date & time: 03/30/22  1106     History  Chief Complaint  Patient presents with   Cough    Rebecca Holt is a 36 y.o. female with medical history of depressive disorder, anxiety, arthritis, bipolar disorder, hypothyroid.  Patient presents to ED for evaluation of productive cough.  Patient reports that ever since last Saturday she has developed a persistent cough.  Patient has tried to alleviate her cough with Mucinex, Robitussin and leftover Tessalon Perles from a previous prescription however none of these have helped.  The patient states her cough is worse at night.  Patient reports that she is unable to sleep well because of her cough.  The patient states she is taking Hycodan in the past which have not worked to alleviate her symptoms.  Patient reports that she did have a fever up to 100.8 last night.  Patient states she took a home COVID test which was negative.  The patient was seen at her PCP yesterday and diagnosed with a postviral bronchitis and given Tussionex.  The patient reports that she took this medication last night however it did not alleviate her symptoms.  Patient denies any nausea, vomiting, chest pain, shortness of breath.   Cough Associated symptoms: no chest pain and no shortness of breath        Home Medications Prior to Admission medications   Medication Sig Start Date End Date Taking? Authorizing Provider  predniSONE (DELTASONE) 20 MG tablet Take 2 tablets (40 mg total) by mouth daily. 03/30/22  Yes Azucena Cecil, PA-C  ALPRAZolam Duanne Moron) 0.5 MG tablet TAKE ONE TABLET BY MOUTH TWO TIMES A DAY AS NEEDED FOR ANXIETY 03/05/22   Thayer Headings, PMHNP  B Complex-C (B-COMPLEX WITH VITAMIN C) tablet Take 1 tablet by mouth daily.    [provider]  buPROPion (WELLBUTRIN XL) 150 MG 24 hr tablet Take 1 tablet (150 mg total) by mouth daily. 02/12/22   Thayer Headings, PMHNP   chlorpheniramine-HYDROcodone (TUSSIONEX) 10-8 MG/5ML Take 5 mLs by mouth at bedtime as needed for cough. 03/29/22   Burchette, Alinda Sierras, MD  cholecalciferol (VITAMIN D3) 25 MCG (1000 UNIT) tablet Take 1,000 Units by mouth daily.    [provider]  colchicine 0.6 MG tablet Take one tablet by mouth once daily 01/08/22   Burchette, Alinda Sierras, MD  Erenumab-aooe (AIMOVIG) 140 MG/ML SOAJ Inject 140 mg into the skin every 28 (twenty-eight) days. 02/05/22   Pieter Partridge, DO  lamoTRIgine (LAMICTAL) 25 MG tablet TAKE THREE TABLETS BY MOUTH DAILY 02/21/22   Thayer Headings, PMHNP  LYSINE PO Take 3 tablets by mouth in the morning.    [provider]  modafinil (PROVIGIL) 200 MG tablet Take 1 tablet (200 mg total) by mouth every morning AND 0.5 tablets (100 mg total) daily at 12 noon. 02/12/22 03/14/22  Thayer Headings, PMHNP  ondansetron (ZOFRAN-ODT) 4 MG disintegrating tablet Take 4 mg by mouth every 8 (eight) hours as needed for vomiting or nausea.    [provider]  rizatriptan (MAXALT-MLT) 10 MG disintegrating tablet Take 1 tablet (10 mg total) by mouth as needed for migraine (may repeat after 2 hours.  Maximum 2 tablets in 24 hours.). May repeat in 2 hours if needed 09/10/21   Pieter Partridge, DO  ZENPEP 40000-126000 units CPEP Take 1-2 capsules by mouth See admin instructions. Take 1 capsule by mouth with each snack & take 2 capsules by  mouth with each meal 05/31/21   [provider]      Allergies    Tramadol and Latex    Review of Systems   Review of Systems  Respiratory:  Positive for cough. Negative for shortness of breath.   Cardiovascular:  Negative for chest pain.  Gastrointestinal:  Negative for nausea and vomiting.  All other systems reviewed and are negative.   Physical Exam Updated Vital Signs BP 106/82 (BP Location: Right Arm)   Pulse 99   Temp 98.5 F (36.9 C) (Oral)   Resp 16   Ht '5\' 7"'$  (1.702 m)   Wt 56.2 kg   LMP 04/23/2021 (Exact Date)   SpO2  100%   BMI 19.42 kg/m  Physical Exam Vitals and nursing note reviewed.  Constitutional:      General: She is not in acute distress.    Appearance: Normal appearance. She is not ill-appearing, toxic-appearing or diaphoretic.  HENT:     Head: Normocephalic and atraumatic.     Nose: Nose normal. No congestion.     Mouth/Throat:     Mouth: Mucous membranes are moist.     Pharynx: Oropharynx is clear.  Eyes:     Extraocular Movements: Extraocular movements intact.     Conjunctiva/sclera: Conjunctivae normal.     Pupils: Pupils are equal, round, and reactive to light.  Cardiovascular:     Rate and Rhythm: Normal rate and regular rhythm.  Pulmonary:     Effort: Pulmonary effort is normal.     Breath sounds: Normal breath sounds. No wheezing.  Abdominal:     General: Abdomen is flat. Bowel sounds are normal.     Palpations: Abdomen is soft.     Tenderness: There is no abdominal tenderness.  Musculoskeletal:     Cervical back: Normal range of motion and neck supple. No tenderness.  Skin:    General: Skin is warm and dry.     Capillary Refill: Capillary refill takes less than 2 seconds.  Neurological:     Mental Status: She is alert and oriented to person, place, and time.     ED Results / Procedures / Treatments   Labs (all labs ordered are listed, but only abnormal results are displayed) Labs Reviewed  RESP PANEL BY RT-PCR (FLU A&B, COVID) ARPGX2    EKG None  Radiology DG Chest 2 View  Result Date: 03/30/2022 CLINICAL DATA:  Cough EXAM: CHEST - 2 VIEW COMPARISON:  09/09/2020 FINDINGS: The heart size and mediastinal contours are within normal limits. Both lungs are clear. The visualized skeletal structures are unremarkable. IMPRESSION: No acute abnormality of the lungs. Electronically Signed   By: Delanna Ahmadi M.D.   On: 03/30/2022 12:52    Procedures Procedures   Medications Ordered in ED Medications  predniSONE (DELTASONE) tablet 40 mg (40 mg Oral Given 03/30/22  1503)    ED Course/ Medical Decision Making/ A&P                           Medical Decision Making Amount and/or Complexity of Data Reviewed Radiology: ordered.   36 year old female presents to the ED for evaluation of cough.  Please see HPI for further details.  On examination patient afebrile nontachycardic.  Patient lung sounds clear bilaterally, not hypoxic.  Patient abdomen soft and compressible all 4 quadrants.  Patient nontoxic in appearance.  Patient seen at PCPs office yesterday and diagnosed with postviral bronchitis.  Patient states she has not received  chest x-ray.  We will proceed with chest x-ray as well as viral panel.  Patient viral panel negative, patient chest x-ray shows no consolidations or effusion concerning for pneumonia.  At this time, the patient will be given a burst of steroids and placed on 5 days of 40 mg of steroids beginning tomorrow.  Patient will be advised to follow-up with her PCP.  The patient voices understanding of my instructions.  Patient had all of her questions answered to her satisfaction prior to discharge.  The patient stable at this time for discharge.  Final Clinical Impression(s) / ED Diagnoses Final diagnoses:  Acute bronchitis, unspecified organism    Rx / DC Orders ED Discharge Orders          Ordered    predniSONE (DELTASONE) 20 MG tablet  Daily        03/30/22 1502              Azucena Cecil, PA-C 03/30/22 1504    Godfrey Pick, MD 03/31/22 [phone]

## 2022-03-30 NOTE — Discharge Instructions (Signed)
Return to ED with any new symptoms Please continue treating your symptoms at home with Tussionex that you are provided at your PCPs office Please read attached guide concerning acute bronchitis Please begin taking steroids tomorrow.  You will take these for the next 5 days beginning tomorrow.

## 2022-03-30 NOTE — ED Triage Notes (Signed)
Patient repots that she went to her PCP yesterday for a  productive cough with yellow sputum. Patient states she was diagnosed with bronchitis and was prescribed Tussinex. Patient also c/o "chest burning" since last night.

## 2022-04-11 ENCOUNTER — Encounter: Payer: Self-pay | Admitting: Family Medicine

## 2022-04-12 ENCOUNTER — Encounter: Payer: Self-pay | Admitting: Family Medicine

## 2022-04-12 ENCOUNTER — Telehealth (INDEPENDENT_AMBULATORY_CARE_PROVIDER_SITE_OTHER): Payer: 59 | Admitting: Family Medicine

## 2022-04-12 VITALS — Temp 100.0°F | Ht 67.0 in | Wt 124.0 lb

## 2022-04-12 DIAGNOSIS — U071 COVID-19: Secondary | ICD-10-CM | POA: Diagnosis not present

## 2022-04-12 MED ORDER — MOLNUPIRAVIR EUA 200MG CAPSULE: 4.0000 | ORAL_CAPSULE | Freq: Two times a day (BID) | ORAL | 0 refills | Status: AC

## 2022-04-12 NOTE — Progress Notes (Signed)
Patient ID: Rebecca Holt, female   DOB: 1985/12/18, 36 y.o.   MRN: [id number]   Virtual Visit via Video Note  I connected with Rebecca Holt on 04/12/22 at  7:30 AM EDT by a video enabled telemedicine application and verified that I am speaking with the correct person using two identifiers.  Location patient: home Location provider:work or home office Persons participating in the virtual visit: patient, provider  I discussed the limitations of evaluation and management by telemedicine and the availability of in person appointments. The patient expressed understanding and agreed to proceed.   HPI:  Rebecca Holt is seen with diagnosis of COVID.  She does had recent viral upper respiratory infection that she was getting over and then yesterday morning developed acute onset of cough, fever, body aches, nasal congestion, chills and some mild diarrhea.  Her husband actually was diagnosed with COVID couple days ago so she was already suspicious of this being COVID.  She is keeping down fluids.  She is taking Tylenol and Aleve for body aches and fever.  She states she has had tremendous difficulty fighting off infections in the past.  She did apparently have immunoglobulin levels recently per rheumatology that were normal per patient except for slightly low IgE.  She has no significant dyspnea.  No chronic lung disease.  ROS: See pertinent positives and negatives per HPI.  Past Medical History:  Diagnosis Date   Anxiety    Arthritis    si joints   Bipolar disorder ([place])    Chicken pox    as child   Depressive disorder 02/08/2019   History of COVID-19 09/09/2020   g i issues x 2-3 weeks all symptoms reolved   History of hypothyroidism    yrs ago per pt on 04-17-2021   Pneumonia    yrs ago per pt on 04-17-2021   Wears glasses     Past Surgical History:  Procedure Laterality Date   ANTERIOR AND POSTERIOR REPAIR N/A 04/23/2021   Procedure: POSTERIOR REPAIR (RECTOCELE);  Surgeon: Paula Compton,  MD;  [facility];  Service: Gynecology;  Laterality: N/A;   BIOPSY  07/25/2021   Procedure: BIOPSY;  Surgeon: [name], MD;  Location: [facility];  Service: Gastroenterology;;   COLONOSCOPY WITH PROPOFOL N/A 07/25/2021   Procedure: COLONOSCOPY WITH PROPOFOL;  Surgeon: [name], MD;  Location: [facility];  Service: Gastroenterology;  Laterality: N/A;   CYSTOSCOPY N/A 04/23/2021   Procedure: CYSTOSCOPY;  Surgeon: Paula Compton, MD;  Location: [facility];  Service: Gynecology;  Laterality: N/A;   ESOPHAGOGASTRODUODENOSCOPY (EGD) WITH PROPOFOL N/A 07/25/2021   Procedure: ESOPHAGOGASTRODUODENOSCOPY (EGD) WITH PROPOFOL;  Surgeon: [name], MD;  Location: [facility];  Service: Gastroenterology;  Laterality: N/A;   LAPAROSCOPIC VAGINAL HYSTERECTOMY WITH SALPINGECTOMY Bilateral 04/23/2021   Procedure: LAPAROSCOPIC ASSISTED VAGINAL HYSTERECTOMY WITH BILATERAL  SALPINGECTOMY;  Surgeon: Paula Compton, MD;  Location: [place];  Service: Gynecology;  Laterality: Bilateral;   TONSILLECTOMY Bilateral 2020   TRANSFORAMINAL LUMBAR INTERBODY FUSION (TLIF) WITH PEDICLE SCREW FIXATION 1 LEVEL N/A 05/07/2019   Procedure: Lumbar Five- Sacral One Transforaminal lumbar interbody Fusion;  Surgeon: Erline Levine, MD;  Location: [place];  Service: Neurosurgery;  Laterality: N/A;  Lumbar 5 Sacral 1 Transforaminal lumbar interbody fusion    Family History  Problem Relation Age of Onset   Bipolar disorder Mother    Mental illness Mother        bipolar   Diabetic kidney disease Father    Hyperlipidemia Father  Diabetes Father    Thyroid cancer Maternal Grandmother    Cancer Maternal Grandfather        leukemia   Cancer Paternal Grandfather        colon   Diabetes Paternal Grandfather    Bipolar disorder Son    Anesthesia problems Neg Hx    Hypotension Neg Hx    Malignant hyperthermia Neg Hx    Pseudochol deficiency Neg Hx     SOCIAL  HX: Non-smoker.   Current Outpatient Medications:    ALPRAZolam (XANAX) 0.5 MG tablet, TAKE ONE TABLET BY MOUTH TWO TIMES A DAY AS NEEDED FOR ANXIETY, Disp: 45 tablet, Rfl: 5   B Complex-C (B-COMPLEX WITH VITAMIN C) tablet, Take 1 tablet by mouth daily., Disp: , Rfl:    buPROPion (WELLBUTRIN XL) 150 MG 24 hr tablet, Take 1 tablet (150 mg total) by mouth daily., Disp: 90 tablet, Rfl: 1   chlorpheniramine-HYDROcodone (TUSSIONEX) 10-8 MG/5ML, Take 5 mLs by mouth at bedtime as needed for cough., Disp: 115 mL, Rfl: 0   cholecalciferol (VITAMIN D3) 25 MCG (1000 UNIT) tablet, Take 1,000 Units by mouth daily., Disp: , Rfl:    colchicine 0.6 MG tablet, Take one tablet by mouth once daily, Disp: 30 tablet, Rfl: 5   Erenumab-aooe (AIMOVIG) 140 MG/ML SOAJ, Inject 140 mg into the skin every 28 (twenty-eight) days., Disp: 1.12 mL, Rfl: 5   lamoTRIgine (LAMICTAL) 25 MG tablet, TAKE THREE TABLETS BY MOUTH DAILY, Disp: 90 tablet, Rfl: 2   LYSINE PO, Take 3 tablets by mouth in the morning., Disp: , Rfl:    molnupiravir EUA (LAGEVRIO) 200 mg CAPS capsule, Take 4 capsules (800 mg total) by mouth 2 (two) times daily for 5 days., Disp: 40 capsule, Rfl: 0   ondansetron (ZOFRAN-ODT) 4 MG disintegrating tablet, Take 4 mg by mouth every 8 (eight) hours as needed for vomiting or nausea., Disp: , Rfl:    rizatriptan (MAXALT-MLT) 10 MG disintegrating tablet, Take 1 tablet (10 mg total) by mouth as needed for migraine (may repeat after 2 hours.  Maximum 2 tablets in 24 hours.). May repeat in 2 hours if needed, Disp: 9 tablet, Rfl: 5   ZENPEP 40000-126000 units CPEP, Take 1-2 capsules by mouth See admin instructions. Take 1 capsule by mouth with each snack & take 2 capsules by mouth with each meal, Disp: , Rfl:    modafinil (PROVIGIL) 200 MG tablet, Take 1 tablet (200 mg total) by mouth every morning AND 0.5 tablets (100 mg total) daily at 12 noon., Disp: 45 tablet, Rfl: 3  EXAM:  VITALS per patient if applicable:  GENERAL:  alert, oriented, appears well and in no acute distress  HEENT: atraumatic, conjunttiva clear, no obvious abnormalities on inspection of external nose and ears  NECK: normal movements of the head and neck  LUNGS: on inspection no signs of respiratory distress, breathing rate appears normal, no obvious gross SOB, gasping or wheezing  CV: no obvious cyanosis  MS: moves all visible extremities without noticeable abnormality  PSYCH/NEURO: pleasant and cooperative, no obvious depression or anxiety, speech and thought processing grossly intact  ASSESSMENT AND PLAN:  Discussed the following assessment and plan:  COVID.  Onset of symptoms about 24 hours ago.  Patient is concerned because she has had difficulty fighting off infections previously.  We offered antiviral therapy and she is interested.  Start molnupiravir 4 capsules by mouth twice daily for 5 days -Continue plenty of fluids and rest -Monitor for any low O2 sats  90% or lower -Patient aware of isolation precautions -Follow-up as needed for any persistent or progressive symptoms     I discussed the assessment and treatment plan with the patient. The patient was provided an opportunity to ask questions and all were answered. The patient agreed with the plan and demonstrated an understanding of the instructions.   The patient was advised to call back or seek an in-person evaluation if the symptoms worsen or if the condition fails to improve as anticipated.     Carolann Littler, MD

## 2022-04-12 NOTE — Telephone Encounter (Signed)
Noted  

## 2022-04-15 ENCOUNTER — Ambulatory Visit: Payer: 59 | Admitting: Psychiatry

## 2022-04-29 ENCOUNTER — Ambulatory Visit (INDEPENDENT_AMBULATORY_CARE_PROVIDER_SITE_OTHER): Payer: 59 | Admitting: Psychiatry

## 2022-04-29 ENCOUNTER — Encounter: Payer: Self-pay | Admitting: Psychiatry

## 2022-04-29 ENCOUNTER — Telehealth: Payer: Self-pay | Admitting: Neurology

## 2022-04-29 DIAGNOSIS — G4719 Other hypersomnia: Secondary | ICD-10-CM

## 2022-04-29 DIAGNOSIS — F39 Unspecified mood [affective] disorder: Secondary | ICD-10-CM

## 2022-04-29 DIAGNOSIS — R5382 Chronic fatigue, unspecified: Secondary | ICD-10-CM | POA: Diagnosis not present

## 2022-04-29 MED ORDER — SUNOSI 75 MG PO TABS: ORAL_TABLET | ORAL | 0 refills | Status: DC

## 2022-04-29 MED ORDER — SUNOSI 150 MG PO TABS: ORAL_TABLET | ORAL | 0 refills | Status: DC

## 2022-04-29 NOTE — Telephone Encounter (Signed)
Patient called and said her headaches have increased and worsened since her last visit. She like Dr. Tomi Likens to be aware she is getting them more and they are different now. She'd like to speak with someone about this.

## 2022-04-29 NOTE — Progress Notes (Unsigned)
Rebecca Holt [id number] 05-03-1986 36 y.o.  Subjective:   Patient ID:  Rebecca Holt is a 36 y.o. (DOB 08-01-1985) female.  Chief Complaint: No chief complaint on file.   HPI [name] presents to the office today for follow-up of *** She reports that adding Modafinil later in the afternoon was not helpful. She reports that morning dose remains helpful.  She recently had COVID- "everything that had gotten better got worse again." She reports that her physical symptoms seemed to start last year with COVID. She reports that that she is "tired of being sick. Tired of feeling bad." Sad mood. She reports mild irritability. Denies impulsivity or risky behavior. She reports feeling overwhelmed with multiple responsibilities. She reports that she continues to have significant "brain fog."  Energy has been low. Motivation has also been low. She reports "I was feeling a lot better overall" before COVID. She reports very low energy, motivation, and concentration after 2 pm. She reports multiple awakenings at night due to medical issues. Appetite remains low. Denies SI.   Rheumatologist started her on Plaquenil earlier this month.   Friend recently visited.   Typically takes Xanax 0.5 mg at bedtime to help with sleep and usually not needing it during the daytime  Past Psychiatric Medication Trials: Ativan- partially effective Tranxene          Wellbutrin XL- Increased activation/agitation with 450 mg. Reports that Wellbutrin seemed to initially be effective.   Zoloft Lexapro Celexa- Had severe discontinuation Pristiq Abilify Rexulti- Cost prohibitive  Olanzapine Risperidone- Involuntary orofacial movements Trileptal Lamictal- Had increased irritability and insomnia with 100 mg dose.  Ambien Modafinil- Helpful for mood.   [place] Office Visit from 06/05/2020 in [place] Total Score 0      PHQ2-9    [place] Office Visit from 01/04/2022 in  [facility] [facility] [place]  PHQ-2 Total Score 2  PHQ-9 Total Score 14      Flowsheet Row ED from 03/30/2022 in [place] DEPT Admission (Discharged) from 07/25/2021 in [facility] ENDOSCOPY Admission (Discharged) from 04/23/2021 in [place] No Risk No Risk No Risk        Review of Systems:  Review of Systems  HENT:  Negative for rhinorrhea and sneezing.   Gastrointestinal:  Positive for constipation, diarrhea and nausea.  Skin:        She reports feeling itchy for the last 3 days. Denies any rash. Not relieved by Benadryl. Itching slightly less this morning. Reports taking Lamictal continuously without missed doses. Denies changing soaps or detergents.    Medications: {medication reviewed/display:[id number]}  Current Outpatient Medications  Medication Sig Dispense Refill   ALPRAZolam (XANAX) 0.5 MG tablet TAKE ONE TABLET BY MOUTH TWO TIMES A DAY AS NEEDED FOR ANXIETY 45 tablet 5   B Complex-C (B-COMPLEX WITH VITAMIN C) tablet Take 1 tablet by mouth daily.     buPROPion (WELLBUTRIN XL) 150 MG 24 hr tablet Take 1 tablet (150 mg total) by mouth daily. 90 tablet 1   chlorpheniramine-HYDROcodone (TUSSIONEX) 10-8 MG/5ML Take 5 mLs by mouth at bedtime as needed for cough. 115 mL 0   cholecalciferol (VITAMIN D3) 25 MCG (1000 UNIT) tablet Take 1,000 Units by mouth daily.     colchicine 0.6 MG tablet Take one tablet by mouth once daily 30 tablet 5   Erenumab-aooe (AIMOVIG) 140 MG/ML SOAJ Inject 140 mg into the skin every 28 (twenty-eight) days. 1.12  mL 5   lamoTRIgine (LAMICTAL) 25 MG tablet TAKE THREE TABLETS BY MOUTH DAILY 90 tablet 2   LYSINE PO Take 3 tablets by mouth in the morning.     modafinil (PROVIGIL) 200 MG tablet Take 1 tablet (200 mg total) by mouth every morning AND 0.5 tablets (100 mg total) daily at 12 noon. 45 tablet 3   ondansetron (ZOFRAN-ODT) 4 MG disintegrating tablet Take 4 mg by mouth every 8  (eight) hours as needed for vomiting or nausea.     rizatriptan (MAXALT-MLT) 10 MG disintegrating tablet Take 1 tablet (10 mg total) by mouth as needed for migraine (may repeat after 2 hours.  Maximum 2 tablets in 24 hours.). May repeat in 2 hours if needed 9 tablet 5   ZENPEP 40000-126000 units CPEP Take 1-2 capsules by mouth See admin instructions. Take 1 capsule by mouth with each snack & take 2 capsules by mouth with each meal     No current facility-administered medications for this visit.    Medication Side Effects: None  Allergies:  Allergies  Allergen Reactions   Tramadol Itching   Latex Other (See Comments)    Causes irritation.    Past Medical History:  Diagnosis Date   Anxiety    Arthritis    si joints   Bipolar disorder ([place])    Chicken pox    as child   Depressive disorder 02/08/2019   History of COVID-19 09/09/2020   g i issues x 2-3 weeks all symptoms reolved   History of hypothyroidism    yrs ago per pt on 04-17-2021   Pneumonia    yrs ago per pt on 04-17-2021   Wears glasses     Past Medical History, Surgical history, Social history, and Family history were reviewed and updated as appropriate.   Please see review of systems for further details on the patient's review from today.   Objective:   Physical Exam:  LMP 04/23/2021 (Exact Date)   Physical Exam  Lab Review:     Component Value Date/Time   NA 140 07/19/2021 1037   NA 141 02/02/2020 0000   K 3.9 07/19/2021 1037   CL 104 07/19/2021 1037   CO2 28 07/19/2021 1037   GLUCOSE 83 07/19/2021 1037   BUN 8 07/19/2021 1037   BUN 10 02/02/2020 0000   CREATININE 0.75 07/19/2021 1037   CALCIUM 9.3 07/19/2021 1037   PROT 6.9 07/19/2021 1037   ALBUMIN 4.6 07/19/2021 1037   AST 17 07/19/2021 1037   ALT 16 07/19/2021 1037   ALKPHOS 53 07/19/2021 1037   BILITOT 0.4 07/19/2021 1037   GFRNONAA >60 04/24/2021 0145   GFRAA 90.96 02/02/2020 0000       Component Value Date/Time   WBC 8.3 10/10/2021  1245   RBC 4.23 10/10/2021 1245   HGB 13.4 10/10/2021 1245   HCT 39.2 10/10/2021 1245   PLT 265.0 10/10/2021 1245   MCV 92.7 10/10/2021 1245   MCH 29.0 04/24/2021 0145   MCHC 34.1 10/10/2021 1245   RDW 17.4 (H) 10/10/2021 1245   LYMPHSABS 0.9 10/10/2021 1245   MONOABS 0.4 10/10/2021 1245   EOSABS 0.0 10/10/2021 1245   BASOSABS 0.0 10/10/2021 1245    No results found for: "POCLITH", "LITHIUM"   No results found for: "PHENYTOIN", "PHENOBARB", "VALPROATE", "CBMZ"   .res Assessment: Plan:    There are no diagnoses linked to this encounter.   Please see After Visit Summary for patient specific instructions.  Future Appointments  Date Time  Provider [place]  10/28/2022  9:10 AM Pieter Partridge, DO [facility] None    No orders of the defined types were placed in this encounter.   -------------------------------

## 2022-04-29 NOTE — Telephone Encounter (Signed)
I would add another medication to the [facility].  If no objection, I would start topiramate .  Please send in prescription for topiramate '25mg'$  at bedtime.  If no improvement in 4 weeks, then contact us and we can increase dose.  Numbness and tingling may be a side effect, so not worry if she experiences this.  Also take precautions not to get pregnant.  For rescue therapy, we can try Nurtec (1 tablet in 24 hours).  She should be limiting use of pain relievers to no more than 2 days out of week to prevent risk of rebound or medication-overuse headache.     Patient states numbness in her hands already patient is seeing a Rheumatologist. Please advised if she should start Topiramate.

## 2022-04-29 NOTE — Telephone Encounter (Signed)
How frequent or the headaches (on average, how many days a week/month are they occurring)?  Couple more a week, in the back of eye and down the back of her neck.  How long do the headaches last?  Couple of days. Verify what preventative medication and dose you are taking (e.g. topiramate, propranolol, amitriptyline, Emgality, etc)  Aimovig 140 mg Verify which rescue medication you are taking (triptan, Advil, Excedrin, Aleve, Ubrelvy, etc)  Rizatriptan How often are you taking pain relievers/analgesics/rescue mediction?  Yes,Aleve or Tylenol.

## 2022-04-30 ENCOUNTER — Telehealth: Payer: Self-pay | Admitting: Pharmacy Technician

## 2022-04-30 MED ORDER — NURTEC 75 MG PO TBDP: 75.0000 mg | ORAL_TABLET | ORAL | 5 refills | Status: DC | PRN

## 2022-04-30 MED ORDER — TOPIRAMATE 25 MG PO TABS: 25.0000 mg | ORAL_TABLET | Freq: Every day | ORAL | 3 refills | Status: DC

## 2022-04-30 NOTE — Telephone Encounter (Signed)
Patient Advocate Encounter   Received notification that prior authorization for Nurtec '75MG'$  dispersible tablets is required.   PA submitted on 04/30/2022 Key [id number] Status is pending       Lyndel Safe, [place] Patient Advocate Specialist [place] Patient Advocate Team Direct Number: [phone]  Fax: [phone]

## 2022-05-06 ENCOUNTER — Encounter: Payer: Self-pay | Admitting: Family Medicine

## 2022-05-07 ENCOUNTER — Ambulatory Visit (INDEPENDENT_AMBULATORY_CARE_PROVIDER_SITE_OTHER): Payer: 59 | Admitting: Family Medicine

## 2022-05-07 ENCOUNTER — Encounter: Payer: Self-pay | Admitting: Family Medicine

## 2022-05-07 VITALS — BP 114/76 | HR 82 | Temp 97.9°F | Ht 67.0 in | Wt 129.1 lb

## 2022-05-07 DIAGNOSIS — R221 Localized swelling, mass and lump, neck: Secondary | ICD-10-CM

## 2022-05-07 NOTE — Patient Instructions (Signed)
I will set up MRI of cervical spine to further assess.

## 2022-05-07 NOTE — Progress Notes (Signed)
Established Patient Office Visit  Subjective   Patient ID: Rebecca Holt, female    DOB: 04-13-1986  Age: 36 y.o. MRN: [id number]  Chief Complaint  Patient presents with   Mass    Patient complains of neck mass, x2 weeks     HPI   Jules had sent a message regarding a palpated "lump "lower cervical spine region which she noted recently.  This is about the size of a marble.  She notices this when she flexes or extends the neck.  Denies any injury.  No overlying skin changes.  She has some intermittent paresthesias in both hands but no upper extremity weakness.  In general, feels very poorly with some chronic fatigue issues.  She sees multiple specialists.  She does have some recurrent depression and is followed by psychiatry.  He is also seeing rheumatologist recently.  She has had prior history of lumbar back surgery.  Past Medical History:  Diagnosis Date   Anxiety    Arthritis    si joints   Bipolar disorder ([place])    Chicken pox    as child   Depressive disorder 02/08/2019   History of COVID-19 09/09/2020   g i issues x 2-3 weeks all symptoms reolved   History of hypothyroidism    yrs ago per pt on 04-17-2021   Pneumonia    yrs ago per pt on 04-17-2021   Wears glasses    Past Surgical History:  Procedure Laterality Date   ANTERIOR AND POSTERIOR REPAIR N/A 04/23/2021   Procedure: POSTERIOR REPAIR (RECTOCELE);  Surgeon: Paula Compton, MD;  Location: [facility];  Service: Gynecology;  Laterality: N/A;   BIOPSY  07/25/2021   Procedure: BIOPSY;  Surgeon: [name], MD;  Location: [facility] ENDOSCOPY;  Service: Gastroenterology;;   COLONOSCOPY WITH PROPOFOL N/A 07/25/2021   Procedure: COLONOSCOPY WITH PROPOFOL;  Surgeon: [name], MD;  Location: [facility];  Service: Gastroenterology;  Laterality: N/A;   CYSTOSCOPY N/A 04/23/2021   Procedure: CYSTOSCOPY;  Surgeon: Paula Compton, MD;  Location[facility];  Service: Gynecology;  Laterality:  N/A;   ESOPHAGOGASTRODUODENOSCOPY (EGD) WITH PROPOFOL N/A 07/25/2021   Procedure: ESOPHAGOGASTRODUODENOSCOPY (EGD) WITH PROPOFOL;  Surgeon: [name], MD;  Location: [facility] ENDOS[facility];  Service: Gastroenterology;  Laterality: N/A;   LAPAROSCOPIC VAGINAL HYSTERECTOMY WITH SALPINGECTOMY Bilateral 04/23/2021   Procedure: LAPAROSCOPIC ASSISTED VAGINAL HYSTERECTOMY WITH BILATERAL  SALPINGECTOMY;  Surgeon: Paula Compton, MD;  Location: [facility];  Service: Gynecology;  Laterality: Bilateral;   TONSILLECTOMY Bilateral 2020   TRANSFORAMINAL LUMBAR INTERBODY FUSION (TLIF) WITH PEDICLE SCREW FIXATION 1 LEVEL N/A 05/07/2019   Procedure: Lumbar Five- Sacral One Transforaminal lumbar interbody Fusion;  Surgeon: Erline Levine, MD;  Location: [place];  Service: Neurosurgery;  Laterality: N/A;  Lumbar 5 Sacral 1 Transforaminal lumbar interbody fusion    reports that she has never smoked. She has never used smokeless tobacco. She reports that she does not drink alcohol and does not use drugs. family history includes Bipolar disorder in her mother and son; Cancer in her maternal grandfather and paternal grandfather; Diabetes in her father and paternal grandfather; Diabetic kidney disease in her father; Hyperlipidemia in her father; Mental illness in her mother; Thyroid cancer in her maternal grandmother. Allergies  Allergen Reactions   Tramadol Itching   Latex Other (See Comments)    Causes irritation.    Review of Systems  Constitutional:  Negative for chills and fever.  Cardiovascular:  Negative for chest pain.  Neurological:  Negative  for tremors, focal weakness and weakness.      Objective:     BP 114/76 (BP Location: Left Arm, Patient Position: Sitting, Cuff Size: Normal)   Pulse 82   Temp 97.9 F (36.6 C) (Oral)   Ht '5\' 7"'$  (1.702 m)   Wt 129 lb 1.6 oz (58.6 kg)   LMP 04/23/2021 (Exact Date)   SpO2 97%   BMI 20.22 kg/m  BP Readings from Last 3 Encounters:  05/07/22 114/76   03/30/22 106/82  03/29/22 112/76   Wt Readings from Last 3 Encounters:  05/07/22 129 lb 1.6 oz (58.6 kg)  04/12/22 124 lb (56.2 kg)  03/30/22 124 lb (56.2 kg)      Physical Exam Vitals reviewed.  Constitutional:      General: She is not in acute distress.    Appearance: Normal appearance.  Neck:     Comments: Full range of motion cervical spine.  Lower cervical region around C5 or so she has approximately 1 x 1 cm rounded mass which is nontender to palpation.  No overlying skin changes. Musculoskeletal:     Cervical back: Neck supple.  Neurological:     Mental Status: She is alert.      No results found for any visits on 05/07/22.    The ASCVD Risk score (Arnett DK, et al., 2019) failed to calculate for the following reasons:   The 2019 ASCVD risk score is only valid for ages 1 to 86    Assessment & Plan:   Palpated roughly marble size ?soft tissue mass lower cervical spine region.  This is rounded and mobile.  Doubt epidermal cyst- this appears to be deeper.  Doubt lipoma by consistency and exam.  Set up MRI cervical spine to further assess  No follow-ups on file.    Carolann Littler, MD

## 2022-05-08 ENCOUNTER — Encounter: Payer: Self-pay | Admitting: Family Medicine

## 2022-05-09 ENCOUNTER — Encounter: Payer: Self-pay | Admitting: Family Medicine

## 2022-05-18 ENCOUNTER — Other Ambulatory Visit: Payer: Self-pay | Admitting: Psychiatry

## 2022-05-18 DIAGNOSIS — F39 Unspecified mood [affective] disorder: Secondary | ICD-10-CM

## 2022-05-20 ENCOUNTER — Encounter: Payer: Self-pay | Admitting: Neurology

## 2022-05-20 ENCOUNTER — Other Ambulatory Visit: Payer: Self-pay

## 2022-05-20 MED ORDER — PREDNISONE 10 MG (21) PO TBPK: ORAL_TABLET | ORAL | 0 refills | Status: DC

## 2022-05-25 ENCOUNTER — Ambulatory Visit
Admission: RE | Admit: 2022-05-25 | Discharge: 2022-05-25 | Disposition: A | Discharge status: Home / Self Care | Payer: 59 | Source: Ambulatory Visit | Attending: Family Medicine | Admitting: Family Medicine

## 2022-05-25 DIAGNOSIS — R221 Localized swelling, mass and lump, neck: Secondary | ICD-10-CM

## 2022-05-27 ENCOUNTER — Encounter: Payer: Self-pay | Admitting: Neurology

## 2022-05-27 ENCOUNTER — Encounter: Payer: Self-pay | Admitting: Family Medicine

## 2022-05-27 NOTE — Telephone Encounter (Signed)
Please see result note 
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
## 2022-05-29 ENCOUNTER — Other Ambulatory Visit (HOSPITAL_COMMUNITY): Payer: Self-pay

## 2022-05-29 ENCOUNTER — Ambulatory Visit: Payer: 59 | Admitting: Neurology

## 2022-05-29 ENCOUNTER — Encounter: Payer: Self-pay | Admitting: Neurology

## 2022-05-29 VITALS — BP 120/70 | HR 83 | Resp 20 | Ht 67.0 in | Wt 132.0 lb

## 2022-05-29 DIAGNOSIS — M47812 Spondylosis without myelopathy or radiculopathy, cervical region: Secondary | ICD-10-CM

## 2022-05-29 DIAGNOSIS — G43009 Migraine without aura, not intractable, without status migrainosus: Secondary | ICD-10-CM

## 2022-05-29 DIAGNOSIS — G4452 New daily persistent headache (NDPH): Secondary | ICD-10-CM

## 2022-05-29 MED ORDER — KETOROLAC TROMETHAMINE 60 MG/2ML IM SOLN
60.0000 mg | Freq: Once | INTRAMUSCULAR | Status: AC
  Administered 2022-05-29: 60 mg via INTRAMUSCULAR

## 2022-05-29 MED ORDER — NORTRIPTYLINE HCL 10 MG PO CAPS: ORAL_CAPSULE | ORAL | 0 refills | Status: DC

## 2022-05-29 MED ORDER — NURTEC 75 MG PO TBDP: 75.0000 mg | ORAL_TABLET | ORAL | 5 refills | Status: DC | PRN

## 2022-05-29 NOTE — Telephone Encounter (Signed)
Nurtec qty changed to 8 and new script sent.

## 2022-05-29 NOTE — Patient Instructions (Addendum)
Start nortriptyline '10mg'$  at bedtime for one week, then increase to '20mg'$  at bedtime.  By time of refill, give me update and we can increase dose if needed. Will follow up on Nurtec Refer to physical therapy for neck pain Limit use of pain relievers to no more than 2 days out of week to prevent risk of rebound or medication-overuse headache. Check CTA of head (for new daily persistent headache) Follow up in 3 months (working).

## 2022-05-29 NOTE — Telephone Encounter (Signed)
Patient Advocate Encounter  Received a fax from [place] regarding Prior Authorization for Nurtec '75MG'$  dispersible tablets.   Authorization was not able to be processed.   After running a test claim, it appears it is a quantity issue as insurance will not pay for 16 tabs in 30 days. But they WILL cover 8 tabs in 30 days. Co-pay for 8 in 30 is $0.00

## 2022-05-30 ENCOUNTER — Encounter: Payer: Self-pay | Admitting: Neurology

## 2022-06-04 ENCOUNTER — Telehealth: Payer: Self-pay

## 2022-06-04 ENCOUNTER — Other Ambulatory Visit: Payer: Self-pay | Admitting: Neurology

## 2022-06-04 ENCOUNTER — Encounter: Payer: Self-pay | Admitting: Neurology

## 2022-06-04 MED ORDER — QULIPTA 60 MG PO TABS: 60.0000 mg | ORAL_TABLET | Freq: Every day | ORAL | 5 refills | Status: DC

## 2022-06-04 NOTE — Telephone Encounter (Signed)
Patient Advocate Encounter   Received notification from [place] that prior authorization is required for Nurtec '75MG'$  dispersible tablets  Submitted: 06-04-2022 Key [id number]  Status is pending

## 2022-06-05 ENCOUNTER — Other Ambulatory Visit (HOSPITAL_COMMUNITY): Payer: Self-pay

## 2022-06-06 ENCOUNTER — Other Ambulatory Visit (HOSPITAL_COMMUNITY): Payer: Self-pay

## 2022-06-06 NOTE — Telephone Encounter (Signed)
Patient Advocate Encounter  Received a fax from [facility] regarding a prior authorization request for Nurtec '75MG'$  dispersible tablets.  The fax states the PA has been resolved, and no additional PA is required. It did not give any effective dates.  If pharmacy is trying to process for qty 16 in 52, the insurance will not pay for it. They will only cover 8 in 70.

## 2022-06-06 NOTE — Telephone Encounter (Signed)
Patient advised of the Nurtec approval.

## 2022-06-11 ENCOUNTER — Encounter: Payer: Self-pay | Admitting: Neurology

## 2022-06-12 ENCOUNTER — Other Ambulatory Visit: Payer: Self-pay | Admitting: Neurology

## 2022-06-13 ENCOUNTER — Inpatient Hospital Stay: Admission: RE | Admit: 2022-06-13 | Payer: 59 | Source: Ambulatory Visit

## 2022-06-13 ENCOUNTER — Ambulatory Visit
Admission: RE | Admit: 2022-06-13 | Discharge: 2022-06-13 | Disposition: A | Discharge status: Home / Self Care | Payer: 59 | Source: Ambulatory Visit | Attending: Neurology | Admitting: Neurology

## 2022-06-13 DIAGNOSIS — G4452 New daily persistent headache (NDPH): Secondary | ICD-10-CM

## 2022-06-13 DIAGNOSIS — G43009 Migraine without aura, not intractable, without status migrainosus: Secondary | ICD-10-CM

## 2022-06-13 DIAGNOSIS — M47812 Spondylosis without myelopathy or radiculopathy, cervical region: Secondary | ICD-10-CM

## 2022-06-13 MED ORDER — IOPAMIDOL (ISOVUE-370) INJECTION 76%
75.0000 mL | Freq: Once | INTRAVENOUS | Status: AC | PRN
  Administered 2022-06-13: 75 mL via INTRAVENOUS

## 2022-06-14 ENCOUNTER — Telehealth: Payer: Self-pay | Admitting: Neurology

## 2022-06-14 ENCOUNTER — Telehealth: Payer: Self-pay

## 2022-06-14 DIAGNOSIS — I671 Cerebral aneurysm, nonruptured: Secondary | ICD-10-CM

## 2022-06-14 NOTE — Telephone Encounter (Signed)
See results note. 

## 2022-06-14 NOTE — Telephone Encounter (Signed)
Patient advised of her CT results.

## 2022-06-14 NOTE — Telephone Encounter (Signed)
Patient is wanting to get the results of her test that she had done she can see that they are in her Mychart acct

## 2022-06-14 NOTE — Telephone Encounter (Signed)
-----   Message from Pieter Partridge, DO sent at 06/14/2022  7:51 AM EST ----- CT shows a tiny aneurysm.  This is an incidental finding and not the cause of headaches.  It is very small with very low risk of rupture.  It doesn't need any intervention but we should monitor it.  I would like to check an MRA of head in 6 months.  Basically, imaging has not shown anything serious that would be the cause of headaches.

## 2022-06-17 ENCOUNTER — Telehealth: Payer: Self-pay | Admitting: Anesthesiology

## 2022-06-17 MED ORDER — NURTEC 75 MG PO TBDP: 75.0000 mg | ORAL_TABLET | ORAL | 5 refills | Status: AC | PRN

## 2022-06-17 NOTE — Telephone Encounter (Signed)
Per note 06/04/22 Pa approved Nurtec for 8 tabs for 30 days. New script sent.   Patient advised.

## 2022-06-17 NOTE — Telephone Encounter (Signed)
Pt left message with AN stating she needs a refill on her medication   1. Which medications need refilled? Nurtec 75 mg  2. Which pharmacy/location is medication to be sent to? [facility] on [address]    3. Do they need a 30 day or 90 day supply? 30 day supply

## 2022-06-18 ENCOUNTER — Encounter: Payer: Self-pay | Admitting: Neurology

## 2022-06-19 ENCOUNTER — Other Ambulatory Visit: Payer: Self-pay | Admitting: Neurology

## 2022-06-19 MED ORDER — GABAPENTIN 100 MG PO CAPS: ORAL_CAPSULE | ORAL | 0 refills | Status: DC

## 2022-07-05 ENCOUNTER — Encounter: Payer: Self-pay | Admitting: Physical Therapy

## 2022-07-05 ENCOUNTER — Ambulatory Visit: Payer: 59 | Attending: Neurological Surgery | Admitting: Physical Therapy

## 2022-07-05 ENCOUNTER — Other Ambulatory Visit: Payer: Self-pay

## 2022-07-05 DIAGNOSIS — M542 Cervicalgia: Secondary | ICD-10-CM | POA: Diagnosis not present

## 2022-07-05 DIAGNOSIS — M6281 Muscle weakness (generalized): Secondary | ICD-10-CM | POA: Diagnosis present

## 2022-07-05 DIAGNOSIS — R279 Unspecified lack of coordination: Secondary | ICD-10-CM | POA: Insufficient documentation

## 2022-07-05 NOTE — Therapy (Signed)
OUTPATIENT PHYSICAL THERAPY SHOULDER EVALUATION   Patient Name: Rebecca Holt MRN: [id number] DOB:1986/06/14, 37 y.o., female Today's Date: 07/05/2022   PT End of Session - 07/05/22 1002     Visit Number 1    Number of Visits --   1-2x/week   Date for PT Re-Evaluation 08/30/22    Authorization Type Aetna    PT Start Time 0910    PT Stop Time 0951    PT Time Calculation (min) 41 min             Past Medical History:  Diagnosis Date   Anxiety    Arthritis    si joints   Bipolar disorder ([place])    Chicken pox    as child   Depressive disorder 02/08/2019   History of COVID-19 09/09/2020   g i issues x 2-3 weeks all symptoms reolved   History of hypothyroidism    yrs ago per pt on 04-17-2021   Pneumonia    yrs ago per pt on 04-17-2021   Wears glasses    Past Surgical History:  Procedure Laterality Date   ANTERIOR AND POSTERIOR REPAIR N/A 04/23/2021   Procedure: POSTERIOR REPAIR (RECTOCELE);  Surgeon: Paula Compton, MD;  Location: [facility];  Service: Gynecology;  Laterality: N/A;   BIOPSY  07/25/2021   Procedure: BIOPSY;  Surgeon: [name], MD;  Location: [facility];  Service: Gastroenterology;;   COLONOSCOPY WITH PROPOFOL N/A 07/25/2021   Procedure: COLONOSCOPY WITH PROPOFOL;  Surgeon: [name], MD;  Location: [facility];  Service: Gastroenterology;  Laterality: N/A;   CYSTOSCOPY N/A 04/23/2021   Procedure: CYSTOSCOPY;  Surgeon: Paula Compton, MD;  Location: [facility];  Service: Gynecology;  Laterality: N/A;   ESOPHAGOGASTRODUODENOSCOPY (EGD) WITH PROPOFOL N/A 07/25/2021   Procedure: ESOPHAGOGASTRODUODENOSCOPY (EGD) WITH PROPOFOL;  Surgeon: [name], MD;  Location: [facility];  Service: Gastroenterology;  Laterality: N/A;   LAPAROSCOPIC VAGINAL HYSTERECTOMY WITH SALPINGECTOMY Bilateral 04/23/2021   Procedure: LAPAROSCOPIC ASSISTED VAGINAL HYSTERECTOMY WITH BILATERAL  SALPINGECTOMY;  Surgeon: Paula Compton, MD;   Location: [facility];  Service: Gynecology;  Laterality: Bilateral;   TONSILLECTOMY Bilateral 2020   TRANSFORAMINAL LUMBAR INTERBODY FUSION (TLIF) WITH PEDICLE SCREW FIXATION 1 LEVEL N/A 05/07/2019   Procedure: Lumbar Five- Sacral One Transforaminal lumbar interbody Fusion;  Surgeon: Erline Levine, MD;  Location: [facility];  Service: Neurosurgery;  Laterality: N/A;  Lumbar 5 Sacral 1 Transforaminal lumbar interbody fusion   Patient Active Problem List   Diagnosis Date Noted   Iron deficiency anemia 10/17/2021   Lumbar post-laminectomy syndrome 10/17/2021   Lumbar spondylosis 10/17/2021   S/P laparoscopic assisted vaginal hysterectomy (LAVH) 04/23/2021   Dyspareunia in female 04/11/2021   Dysmenorrhea 04/11/2021   Exocrine pancreatic insufficiency 03/23/2021   Panic attacks 03/20/2020   Status post lumbar spinal fusion 03/20/2020   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   Lumbar foraminal stenosis 05/07/2019   GAD (generalized anxiety disorder) 03/10/2019   OCD (obsessive compulsive disorder) 03/10/2019   Major depression, recurrent, chronic ([place]) 02/08/2019    PCP: Dawley, Theodoro Doing, DO  REFERRING PROVIDER: Dawley, Theodoro Doing, DO  THERAPY DIAG:  Cervicalgia - Plan: PT plan of care cert/re-cert  Muscle weakness - Plan: PT plan of care cert/re-cert  REFERRING DIAG: Cervicalgia [M54.2]   Rationale for Evaluation and Treatment:  Rehabilitation  SUBJECTIVE:  PERTINENT PAST HISTORY:  Lumbar fusion, post-laminectomy syndrome        PRECAUTIONS: None  WEIGHT BEARING RESTRICTIONS No  FALLS:  Has  patient fallen in last 6 months? No, Number of falls: 0  MOI/History of condition:  Onset date: 8 weeks ago  [place] is a 37 y.o. female who presents to clinic with chief complaint of neck soreness and and persistent headache for 8 weeks.  She describes this as tingly and uncomfortable.  She describes a rams horn pattern.  She has had a CT and MRI  which are listed below in diagnostic tests.  She describes N/t in the first 4 digits of her R hand.  She states she has been dropping things.  Her neurosurgeon is aware of this.  These sxs proceeded her headaches by several weeks.   Pain:  Are you having pain? Yes Pain location: neck and headache NPRS scale:  1/10 to 7/10 Aggravating factors: light Relieving factors: no relieving factors Pain description:  "really bad tension headache" Stage: Subacute Stability: staying the same 24 hour pattern: better in the morning, early afternoon it gets worse   Occupation: stay at home mom  Assistive Device: NA  Hand Dominance: R  Patient Goals/Specific Activities: reduce pain and headaches   OBJECTIVE:   DIAGNOSTIC FINDINGS:  MRI Cx  IMPRESSION: 1. Moderate left foraminal stenosis at C3-4 secondary to asymmetric left-sided uncovertebral and facet hypertrophy. 2. Mild left foraminal narrowing at C4-5 secondary to uncovertebral and facet hypertrophy. 3. Mild uncovertebral spurring bilaterally at C5-6 and C6-7 without significant stenosis. 4. No focal osseous lesions are present. The spinous process of C7 is likely palpable. 5. 2.4 x 2.4 cm nodule in the left lobe of the thyroid, previously biopsied at [facility]. This has been evaluated on previous imaging. (ref: J Am Coll Radiol. 2015 Feb;12(2): 143-50).  GENERAL OBSERVATION:  Forward head, rounded shoulders, increased kyphosis in sitting     SENSATION:  Light touch: Appears intact on exam, subjective report of N/T C6 C7   PALPATION: TTP with * pain sub occipitals, palpable nodule mid Cx spine  Cervical ROM  ROM ROM  07/05/2022  Flexion 60  Extension 50  Right lateral flexion 45  Left lateral flexion 30  Right rotation 60*  Left rotation 40*  Flexion rotation (normal is 30 degrees) N  Flexion rotation (normal is 30 degrees) N    (Blank rows = not tested, N = WNL, * = concordant pain)  UPPER EXTREMITY MMT:  MMT  Right 07/05/2022 Left 07/05/2022  Shoulder flexion    Shoulder abduction (C5)    Shoulder ER    Shoulder IR    Middle trapezius 3+ 3+  Lower trapezius 3 3  Shoulder extension    Grip strength 40 60  Cervical flexion (C1,C2)    Cervical S/B (C3)    Shoulder shrug (C4) c c  Elbow flexion (C6) c c  Elbow ext (C7) c c  Thumb ext (C8) c c  Finger abd (T1) c c  Grossly     (Blank rows = not tested, score listed is out of 5 possible points.  N = WNL, D = diminished, C = clear for gross weakness with myotome testing, * = concordant pain with testing)   JOINT MOBILITY TESTING:  WNL  PATIENT SURVEYS:  FOTO 61 -> 72    TODAY'S TREATMENT:   Therex Creating, reviewing, and completing below HEP  Manual therapy: Skilled palpation to identify trigger points for TDN STM to all listed muscles following TDN  Trigger Point Dry-Needling  Treatment instructions: Expect mild to moderate muscle soreness. S/S of pneumothorax if dry  needled over a lung field, and to seek immediate medical attention should they occur. Patient verbalized understanding of these instructions and education.  Patient Consent Given: Yes Education handout provided: No Muscles treated: bil sub occipitals Electrical stimulation performed: No Parameters: N/A Treatment response/outcome: twitch, pain relief     PATIENT EDUCATION:  POC, diagnosis, prognosis, HEP, and outcome measures.  Pt educated via explanation, demonstration, and handout (HEP).  Pt confirms understanding verbally.    HOME EXERCISE PROGRAM: Access Code: [email or web address] URL: [email or web address] Date: 07/05/2022 Prepared by: Shearon Balo  Exercises - Seated Passive Cervical Retraction  - 1 x daily - 7 x weekly - 3 sets - 10 reps  ASTERISK SIGNS   Asterisk Signs Eval (07/05/2022)       Headaches Constant up to 7/10       Mid trap 3+ bil       Low trap 3 bil       L rotation 40 degrees                 ASSESSMENT:  CLINICAL  IMPRESSION: Rebecca Holt is a 37 y.o. female who presents to clinic with signs and sxs consistent with neck pain with concurrent cervicogenic headache with likely sub occipital origin.  She also has some signs of neural impingement, but this is not her CC today and is being followed by her spine specialist.     OBJECTIVE IMPAIRMENTS: Pain, cervical ROM  ACTIVITY LIMITATIONS: Reading, head turns, ADLs  PERSONAL FACTORS: See medical history and pertinent history   REHAB POTENTIAL: Good  CLINICAL DECISION MAKING: Stable/uncomplicated  EVALUATION COMPLEXITY: Low   GOALS:   SHORT TERM GOALS: Target date: 08/02/2022  Evea will be >75% HEP compliant to improve carryover between sessions and facilitate independent management of condition  Evaluation (07/05/2022): ongoing Goal status: INITIAL   LONG TERM GOALS: Target date: 08/30/2022  Devinne will improve FOTO score to 72 as a proxy for functional improvement  Evaluation/Baseline (07/05/2022): 61 Goal status: INITIAL   2.  Emoree will self report >/= 50% decrease in pain from evaluation   Evaluation/Baseline (07/05/2022): 7/10 max pain Goal status: INITIAL   3.  Landrie will improve the following MMTs to >/= 4/5 to show improvement in strength:  mid and lower traps   Evaluation/Baseline (07/05/2022): see chart in note Goal status: INITIAL   4.  Hadlyn will improve cervical L rotation to >/= 50 degrees  Evaluation/Baseline (07/05/2022): 40 degrees Goal status: INITIAL   5.  Jenalyn will improve HA frequency by 75%  Evaluation/Baseline (07/05/2022): constant Goal status: INITIAL   PLAN: PT FREQUENCY: 1-2x/week  PT DURATION: 8 weeks (Ending 08/30/2022)  PLANNED INTERVENTIONS: Therapeutic exercises, Aquatic therapy, Therapeutic activity, Neuro Muscular re-education, Gait training, Patient/Family education, Joint mobilization, Dry Needling, Electrical stimulation, Spinal mobilization and/or manipulation, Moist heat, Taping, Vasopneumatic  device, Ionotophoresis '4mg'$ /ml Dexamethasone, and Manual therapy  PLAN FOR NEXT SESSION: cervical and periscapular strengthening, TDN   Shearon Balo PT, DPT 07/05/2022, 10:05 AM

## 2022-07-09 ENCOUNTER — Ambulatory Visit: Payer: 59

## 2022-07-09 DIAGNOSIS — M542 Cervicalgia: Secondary | ICD-10-CM | POA: Diagnosis not present

## 2022-07-09 DIAGNOSIS — M6281 Muscle weakness (generalized): Secondary | ICD-10-CM

## 2022-07-09 NOTE — Therapy (Signed)
OUTPATIENT PHYSICAL THERAPY TREATMENT NOTE   Patient Name: Rebecca Holt MRN: [id number] DOB:[birth year], 37 y.o., female Today's Date: 07/09/2022  PCP: Karsten Ro, DO  REFERRING PROVIDER: Karsten Ro, DO   END OF SESSION:   PT End of Session - 07/09/22 1212     Visit Number 2    Number of Visits --   1-2x/week   Date for PT Re-Evaluation 08/30/22    Authorization Type Aetna    PT Start Time 1215    PT Stop Time 1255    PT Time Calculation (min) 40 min             Past Medical History:  Diagnosis Date   Anxiety    Arthritis    si joints   Bipolar disorder ([place])    Chicken pox    as child   Depressive disorder 02/08/2019   History of COVID-19 09/09/2020   g i issues x 2-3 weeks all symptoms reolved   History of hypothyroidism    yrs ago per pt on 04-17-2021   Pneumonia    yrs ago per pt on 04-17-2021   Wears glasses    Past Surgical History:  Procedure Laterality Date   ANTERIOR AND POSTERIOR REPAIR N/A 04/23/2021   Procedure: POSTERIOR REPAIR (RECTOCELE);  Surgeon: Paula Compton, MD;  Location: [facility];  Service: Gynecology;  Laterality: N/A;   BIOPSY  07/25/2021   Procedure: BIOPSY;  Surgeon: [name], MD;  [facility];  Service: Gastroenterology;;   COLONOSCOPY WITH PROPOFOL N/A 07/25/2021   Procedure: COLONOSCOPY WITH PROPOFOL;  Surgeon: [name], MD;  Location: [facility];  Service: Gastroenterology;  Laterality: N/A;   CYSTOSCOPY N/A 04/23/2021   Procedure: CYSTOSCOPY;  Surgeon: Paula Compton, MD;  Location: [facility];  Service: Gynecology;  Laterality: N/A;   ESOPHAGOGASTRODUODENOSCOPY (EGD) WITH PROPOFOL N/A 07/25/2021   Procedure: ESOPHAGOGASTRODUODENOSCOPY (EGD) WITH PROPOFOL;  Surgeon: [name], MD;  Location[facility];  Service: Gastroenterology;  Laterality: N/A;   LAPAROSCOPIC VAGINAL HYSTERECTOMY WITH SALPINGECTOMY Bilateral 04/23/2021   Procedure: LAPAROSCOPIC ASSISTED VAGINAL  HYSTERECTOMY WITH BILATERAL  SALPINGECTOMY;  Surgeon: Paula Compton, MD;  Location: [facility];  Service: Gynecology;  Laterality: Bilateral;   TONSILLECTOMY Bilateral 2020   TRANSFORAMINAL LUMBAR INTERBODY FUSION (TLIF) WITH PEDICLE SCREW FIXATION 1 LEVEL N/A 05/07/2019   Procedure: Lumbar Five- Sacral One Transforaminal lumbar interbody Fusion;  Surgeon: Erline Levine, MD;  Location: [facility];  Service: Neurosurgery;  Laterality: N/A;  Lumbar 5 Sacral 1 Transforaminal lumbar interbody fusion   Patient Active Problem List   Diagnosis Date Noted   Iron deficiency anemia 10/17/2021   Lumbar post-laminectomy syndrome 10/17/2021   Lumbar spondylosis 10/17/2021   S/P laparoscopic assisted vaginal hysterectomy (LAVH) 04/23/2021   Dyspareunia in female 04/11/2021   Dysmenorrhea 04/11/2021   Exocrine pancreatic insufficiency 03/23/2021   Panic attacks 03/20/2020   Status post lumbar spinal fusion 03/20/2020   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   Lumbar foraminal stenosis 05/07/2019   GAD (generalized anxiety disorder) 03/10/2019   OCD (obsessive compulsive disorder) 03/10/2019   Major depression, recurrent, chronic ([place]) 02/08/2019    REFERRING DIAG: M54.2 (ICD-10-CM) - Cervicalgia   THERAPY DIAG:  Cervicalgia  Muscle weakness  Rationale for Evaluation and Treatment Rehabilitation  PERTINENT HISTORY: Lumbar fusion, post-laminectomy syndrome     PRECAUTIONS: None  SUBJECTIVE:  SUBJECTIVE STATEMENT:  Pt presents to PT with reports of decreased pain after TPDN last session. Has been compliant with HEP with no adverse effect. Ready to begin PT at this time.    PAIN:  Are you having pain? Yes Pain location: neck and headache NPRS scale:  1/10 to 7/10 Aggravating factors:  light Relieving factors: no relieving factors Pain description:  "really bad tension headache" Stage: Subacute Stability: staying the same 24 hour pattern: better in the morning, early afternoon it gets worse    OBJECTIVE: (objective measures completed at initial evaluation unless otherwise dated)  DIAGNOSTIC FINDINGS:  MRI Cx   IMPRESSION: 1. Moderate left foraminal stenosis at C3-4 secondary to asymmetric left-sided uncovertebral and facet hypertrophy. 2. Mild left foraminal narrowing at C4-5 secondary to uncovertebral and facet hypertrophy. 3. Mild uncovertebral spurring bilaterally at C5-6 and C6-7 without significant stenosis. 4. No focal osseous lesions are present. The spinous process of C7 is likely palpable. 5. 2.4 x 2.4 cm nodule in the left lobe of the thyroid, previously biopsied at [facility]. This has been evaluated on previous imaging. (ref: J Am Coll Radiol. 2015 Feb;12(2): 143-50).   GENERAL OBSERVATION:          Forward head, rounded shoulders, increased kyphosis in sitting                               SENSATION:          Light touch: Appears intact on exam, subjective report of N/T C6 C7           PALPATION: TTP with * pain sub occipitals, palpable nodule mid Cx spine   Cervical ROM   ROM ROM  07/05/2022  Flexion 60  Extension 50  Right lateral flexion 45  Left lateral flexion 30  Right rotation 60*  Left rotation 40*  Flexion rotation (normal is 30 degrees) N  Flexion rotation (normal is 30 degrees) N    (Blank rows = not tested, N = WNL, * = concordant pain)   UPPER EXTREMITY MMT:   MMT Right 07/05/2022 Left 07/05/2022  Shoulder flexion      Shoulder abduction (C5)      Shoulder ER      Shoulder IR      Middle trapezius 3+ 3+  Lower trapezius 3 3  Shoulder extension      Grip strength 40 60  Cervical flexion (C1,C2)      Cervical S/B (C3)      Shoulder shrug (C4) c c  Elbow flexion (C6) c c  Elbow ext (C7) c c  Thumb ext (C8) c c   Finger abd (T1) c c  Grossly        (Blank rows = not tested, score listed is out of 5 possible points.  N = WNL, D = diminished, C = clear for gross weakness with myotome testing, * = concordant pain with testing)     JOINT MOBILITY TESTING:  WNL   PATIENT SURVEYS:  FOTO 61 -> 72              TODAY'S TREATMENT:  Therapeutic Exercise: UBE lvl 1.0 x 3 min while taking subjective  Row 2x10 17# Seated bilateral ER 2x10 RTB Seated horizontal abd 2x10 RTB Supine chin tuck 2x10 3" S/L open book x 10 each Manual Therapy: Skilled palpation to identify trigger points for TDN STM to all listed muscles following TDN  Suboccipital  release Positional release bilateral upper traps Trigger Point Dry-Needling:  Treatment instructions: Expect mild to moderate muscle soreness. S/S of pneumothorax if dry needled over a lung field, and to seek immediate medical attention should they occur. Patient verbalized understanding of these instructions and education.   Patient Consent Given: Yes Education handout provided: No Muscles treated: bil sub occipitals Electrical stimulation performed: No Parameters: N/A Treatment response/outcome: twitch, pain relief                  PATIENT EDUCATION:  POC, diagnosis, prognosis, HEP, and outcome measures.  Pt educated via explanation, demonstration, and handout (HEP).  Pt confirms understanding verbally.             HOME EXERCISE PROGRAM: Access Code: [email or web address] URL: [email or web address] Date: 07/09/2022 Prepared by: Octavio Manns  Exercises - Seated Passive Cervical Retraction  - 1 x daily - 7 x weekly - 3 sets - 10 reps - Shoulder External Rotation and Scapular Retraction with Resistance  - 1 x daily - 7 x weekly - 2 sets - 10 reps - red theraband hold - Standing Shoulder Horizontal Abduction with Resistance  - 1 x daily - 7 x weekly - 2 sets - 10 reps - red theraband hold - Standing Shoulder Row with Anchored Resistance  - 1 x daily  - 7 x weekly - 3 sets - 10 reps - green theraband hold   ASTERISK SIGNS     Asterisk Signs Eval (07/05/2022)            Headaches Constant up to 7/10            Mid trap 3+ bil            Low trap 3 bil            L rotation 40 degrees                               ASSESSMENT:   CLINICAL IMPRESSION: Pt able to complete all precribed exercises no adverse effect. Responded well to TPDN and manual therapy/ Exercises focused on improving periscapular and DNF strength in order to decrease pain. HEP updated, will continue per POC.    OBJECTIVE IMPAIRMENTS: Pain, cervical ROM   ACTIVITY LIMITATIONS: Reading, head turns, ADLs   PERSONAL FACTORS: See medical history and pertinent history     GOALS:     SHORT TERM GOALS: Target date: 08/02/2022   Elbia will be >75% HEP compliant to improve carryover between sessions and facilitate independent management of condition   Evaluation (07/05/2022): ongoing Goal status: INITIAL     LONG TERM GOALS: Target date: 08/30/2022   Trinity will improve FOTO score to 72 as a proxy for functional improvement   Evaluation/Baseline (07/05/2022): 61 Goal status: INITIAL     2.  Kylene will self report >/= 50% decrease in pain from evaluation    Evaluation/Baseline (07/05/2022): 7/10 max pain Goal status: INITIAL     3.  Jhana will improve the following MMTs to >/= 4/5 to show improvement in strength:  mid and lower traps    Evaluation/Baseline (07/05/2022): see chart in note Goal status: INITIAL     4.  Avielle will improve cervical L rotation to >/= 50 degrees   Evaluation/Baseline (07/05/2022): 40 degrees Goal status: INITIAL     5.  Parthenia will improve HA frequency by 75%   Evaluation/Baseline (07/05/2022): constant Goal status: INITIAL  PLAN: PT FREQUENCY: 1-2x/week   PT DURATION: 8 weeks (Ending 08/30/2022)   PLANNED INTERVENTIONS: Therapeutic exercises, Aquatic therapy, Therapeutic activity, Neuro Muscular re-education, Gait training,  Patient/Family education, Joint mobilization, Dry Needling, Electrical stimulation, Spinal mobilization and/or manipulation, Moist heat, Taping, Vasopneumatic device, Ionotophoresis '4mg'$ /ml Dexamethasone, and Manual therapy   PLAN FOR NEXT SESSION: cervical and periscapular strengthening, TDN   Ward Chatters, PT 07/09/2022, 1:45 PM

## 2022-07-11 ENCOUNTER — Ambulatory Visit: Payer: 59 | Admitting: Physical Therapy

## 2022-07-11 ENCOUNTER — Encounter: Payer: Self-pay | Admitting: Physical Therapy

## 2022-07-11 DIAGNOSIS — R279 Unspecified lack of coordination: Secondary | ICD-10-CM

## 2022-07-11 DIAGNOSIS — M6281 Muscle weakness (generalized): Secondary | ICD-10-CM

## 2022-07-11 DIAGNOSIS — M542 Cervicalgia: Secondary | ICD-10-CM

## 2022-07-11 NOTE — Therapy (Signed)
OUTPATIENT PHYSICAL THERAPY TREATMENT NOTE   Patient Name: Rebecca Holt MRN: [id number] DOB:[birth year], 37 y.o., female Today's Date: 07/11/2022  PCP: Karsten Ro, DO  REFERRING PROVIDER: Dawley, Theodoro Doing, DO   END OF SESSION:   PT End of Session - 07/11/22 1000     Visit Number 3    Number of Visits --   1-2x/week   Date for PT Re-Evaluation 08/30/22    Authorization Type Aet[employer]    PT Start Time 1000    PT Stop Time 1041    PT Time Calculation (min) 41 min             Past Medical History:  Diagnosis Date   Anxiety    Arthritis    si joints   Bipolar disorder ([place])    Chicken pox    as child   Depressive disorder 02/08/2019   History of COVID-19 09/09/2020   g i issues x 2-3 weeks all symptoms reolved   History of hypothyroidism    yrs ago per pt on 04-17-2021   Pneumonia    yrs ago per pt on 04-17-2021   Wears glasses    Past Surgical History:  Procedure Laterality Date   ANTERIOR AND POSTERIOR REPAIR N/A 04/23/2021   Procedure: POSTERIOR REPAIR (RECTOCELE);  Surgeon: Paula Compton, MD;  Location: [facility];  Service: Gynecology;  Laterality: N/A;   BIOPSY  07/25/2021   Procedure: BIOPSY;  Surgeon: [name], MD;  Location: [facility];  Service: Gastroenterology;;   COLONOSCOPY WITH PROPOFOL N/A 07/25/2021   Procedure: COLONOSCOPY WITH PROPOFOL;  Surgeon: [name], MD;  Location: [facility];  Service: Gastroenterology;  Laterality: N/A;   CYSTOSCOPY N/A 04/23/2021   Procedure: CYSTOSCOPY;  Surgeon: Paula Compton, MD;  Location: [facility];  Service: Gynecology;  Laterality: N/A;   ESOPHAGOGASTRODUODENOSCOPY (EGD) WITH PROPOFOL N/A 07/25/2021   Procedure: ESOPHAGOGASTRODUODENOSCOPY (EGD) WITH PROPOFOL;  Surgeon: [name], MD;  Location: [facility];  Service: Gastroenterology;  Laterality: N/A;   LAPAROSCOPIC VAGINAL HYSTERECTOMY WITH SALPINGECTOMY Bilateral 04/23/2021   Procedure: LAPAROSCOPIC ASSISTED  VAGINAL HYSTERECTOMY WITH BILATERAL  SALPINGECTOMY;  Surgeon: Paula Compton, MD;  Location: [place];  Service: Gynecology;  Laterality: Bilateral;   TONSILLECTOMY Bilateral 2020   TRANSFORAMINAL LUMBAR INTERBODY FUSION (TLIF) WITH PEDICLE SCREW FIXATION 1 LEVEL N/A 05/07/2019   Procedure: Lumbar Five- Sacral One Transforaminal lumbar interbody Fusion;  Surgeon: Erline Levine, MD;  Location: [place];  Service: Neurosurgery;  Laterality: N/A;  Lumbar 5 Sacral 1 Transforaminal lumbar interbody fusion   Patient Active Problem List   Diagnosis Date Noted   Iron deficiency anemia 10/17/2021   Lumbar post-laminectomy syndrome 10/17/2021   Lumbar spondylosis 10/17/2021   S/P laparoscopic assisted vaginal hysterectomy (LAVH) 04/23/2021   Dyspareunia in female 04/11/2021   Dysmenorrhea 04/11/2021   Exocrine pancreatic insufficiency 03/23/2021   Panic attacks 03/20/2020   Status post lumbar spinal fusion 03/20/2020   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   Lumbar foraminal stenosis 05/07/2019   GAD (generalized anxiety disorder) 03/10/2019   OCD (obsessive compulsive disorder) 03/10/2019   Major depression, recurrent, chronic ([place]) 02/08/2019    REFERRING DIAG: M54.2 (ICD-10-CM) - Cervicalgia   THERAPY DIAG:  Cervicalgia  Muscle weakness  Muscle weakness (generalized)  Unspecified lack of coordination  Rationale for Evaluation and Treatment Rehabilitation  PERTINENT HISTORY: Lumbar fusion, post-laminectomy syndrome     PRECAUTIONS: None  SUBJECTIVE:  SUBJECTIVE STATEMENT:  Pt reports that she her pain has returned to baseline over the last 2 days she rates her pain at 5/10.    PAIN:  Are you having pain? Yes Pain location: neck and headache NPRS scale:  1/10 to 7/10 Aggravating  factors: light Relieving factors: no relieving factors Pain description:  "really bad tension headache" Stage: Subacute Stability: staying the same 24 hour pattern: better in the morning, early afternoon it gets worse    OBJECTIVE: (objective measures completed at initial evaluation unless otherwise dated)  DIAGNOSTIC FINDINGS:  MRI Cx   IMPRESSION: 1. Moderate left foraminal stenosis at C3-4 secondary to asymmetric left-sided uncovertebral and facet hypertrophy. 2. Mild left foraminal narrowing at C4-5 secondary to uncovertebral and facet hypertrophy. 3. Mild uncovertebral spurring bilaterally at C5-6 and C6-7 without significant stenosis. 4. No focal osseous lesions are present. The spinous process of C7 is likely palpable. 5. 2.4 x 2.4 cm nodule in the left lobe of the thyroid, previously biopsied at [facility]. This has been evaluated on previous imaging. (ref: J Am Coll Radiol. 2015 Feb;12(2): 143-50).   GENERAL OBSERVATION:          Forward head, rounded shoulders, increased kyphosis in sitting                               SENSATION:          Light touch: Appears intact on exam, subjective report of N/T C6 C7           PALPATION: TTP with * pain sub occipitals, palpable nodule mid Cx spine   Cervical ROM   ROM ROM  07/05/2022  Flexion 60  Extension 50  Right lateral flexion 45  Left lateral flexion 30  Right rotation 60*  Left rotation 40*  Flexion rotation (normal is 30 degrees) N  Flexion rotation (normal is 30 degrees) N    (Blank rows = not tested, N = WNL, * = concordant pain)   UPPER EXTREMITY MMT:   MMT Right 07/05/2022 Left 07/05/2022  Shoulder flexion      Shoulder abduction (C5)      Shoulder ER      Shoulder IR      Middle trapezius 3+ 3+  Lower trapezius 3 3  Shoulder extension      Grip strength 40 60  Cervical flexion (C1,C2)      Cervical S/B (C3)      Shoulder shrug (C4) c c  Elbow flexion (C6) c c  Elbow ext (C7) c c  Thumb ext  (C8) c c  Finger abd (T1) c c  Grossly        (Blank rows = not tested, score listed is out of 5 possible points.  N = WNL, D = diminished, C = clear for gross weakness with myotome testing, * = concordant pain with testing)     JOINT MOBILITY TESTING:  WNL   PATIENT SURVEYS:  FOTO 61 -> 72              TODAY'S TREATMENT:   Therapeutic Exercise: UBE lvl 1.0 x 3 min while taking subjective  UT stretch Chin tuck Seated row - 10# 3x15  Manual Therapy: Skilled palpation to identify trigger points for TDN STM to all listed muscles following TDN  Suboccipital release Positional release bilateral upper traps  Trigger Point Dry-Needling:  Treatment instructions: Expect mild to moderate muscle soreness. S/S of  pneumothorax if dry needled over a lung field, and to seek immediate medical attention should they occur. Patient verbalized understanding of these instructions and education.   Patient Consent Given: Yes Education handout provided: No Muscles treated: bil sub occipitals, bil UT Electrical stimulation performed: YES Parameters: 15 min low frequency - milli amps - low intensity Treatment response/outcome: twitch, pain relief                  PATIENT EDUCATION:  POC, diagnosis, prognosis, HEP, and outcome measures.  Pt educated via explanation, demonstration, and handout (HEP).  Pt confirms understanding verbally.             HOME EXERCISE PROGRAM: Access Code: [email or web address] URL: [email or web address] Date: 07/09/2022 Prepared by: Octavio Manns  Exercises - Seated Passive Cervical Retraction  - 1 x daily - 7 x weekly - 3 sets - 10 reps - Shoulder External Rotation and Scapular Retraction with Resistance  - 1 x daily - 7 x weekly - 2 sets - 10 reps - red theraband hold - Standing Shoulder Horizontal Abduction with Resistance  - 1 x daily - 7 x weekly - 2 sets - 10 reps - red theraband hold - Standing Shoulder Row with Anchored Resistance  - 1 x daily - 7 x  weekly - 3 sets - 10 reps - green theraband hold   ASTERISK SIGNS     Asterisk Signs Eval (07/05/2022)            Headaches Constant up to 7/10            Mid trap 3+ bil            Low trap 3 bil            L rotation 40 degrees                               ASSESSMENT:   CLINICAL IMPRESSION: Brittnie tolerated session well with no adverse reaction.  Trialed TDN + estim for sub occipitals today and will monitor response.  Will integrate more strengthening as pain level reduces.  Pt tolerated TDN ane estim well with reported pain relief.   OBJECTIVE IMPAIRMENTS: Pain, cervical ROM   ACTIVITY LIMITATIONS: Reading, head turns, ADLs   PERSONAL FACTORS: See medical history and pertinent history     GOALS:     SHORT TERM GOALS: Target date: 08/02/2022   Nannie will be >75% HEP compliant to improve carryover between sessions and facilitate independent management of condition   Evaluation (07/05/2022): ongoing Goal status: INITIAL     LONG TERM GOALS: Target date: 08/30/2022   Jenasis will improve FOTO score to 72 as a proxy for functional improvement   Evaluation/Baseline (07/05/2022): 61 Goal status: INITIAL     2.  Vianna will self report >/= 50% decrease in pain from evaluation    Evaluation/Baseline (07/05/2022): 7/10 max pain Goal status: INITIAL     3.  Liyla will improve the following MMTs to >/= 4/5 to show improvement in strength:  mid and lower traps    Evaluation/Baseline (07/05/2022): see chart in note Goal status: INITIAL     4.  Kemani will improve cervical L rotation to >/= 50 degrees   Evaluation/Baseline (07/05/2022): 40 degrees Goal status: INITIAL     5.  Yamilex will improve HA frequency by 75%   Evaluation/Baseline (07/05/2022): constant Goal status: INITIAL     PLAN: PT FREQUENCY: 1-2x/week  PT DURATION: 8 weeks (Ending 08/30/2022)   PLANNED INTERVENTIONS: Therapeutic exercises, Aquatic therapy, Therapeutic activity, Neuro Muscular re-education, Gait  training, Patient/Family education, Joint mobilization, Dry Needling, Electrical stimulation, Spinal mobilization and/or manipulation, Moist heat, Taping, Vasopneumatic device, Ionotophoresis '4mg'$ /ml Dexamethasone, and Manual therapy   PLAN FOR NEXT SESSION: cervical and periscapular strengthening, TDN   Mathis Dad, PT 07/11/2022, 10:45 AM

## 2022-07-15 ENCOUNTER — Encounter: Payer: Self-pay | Admitting: Neurology

## 2022-07-15 ENCOUNTER — Ambulatory Visit: Payer: 59

## 2022-07-15 ENCOUNTER — Other Ambulatory Visit: Payer: Self-pay | Admitting: Neurology

## 2022-07-15 DIAGNOSIS — M6281 Muscle weakness (generalized): Secondary | ICD-10-CM

## 2022-07-15 DIAGNOSIS — M542 Cervicalgia: Secondary | ICD-10-CM | POA: Diagnosis not present

## 2022-07-15 NOTE — Therapy (Signed)
OUTPATIENT PHYSICAL THERAPY TREATMENT NOTE   Patient Name: Rebecca Holt MRN: [id number] DOB:1985-09-26, 37 y.o., female Today's Date: 07/15/2022  PCP: Karsten Ro, DO  REFERRING PROVIDER: Karsten Ro, DO   END OF SESSION:   PT End of Session - 07/15/22 0909     Visit Number 4    Number of Visits --   1-2x/week   Date for PT Re-Evaluation 08/30/22    Authorization Type Aetna    PT Start Time 7782    PT Stop Time 0955    PT Time Calculation (min) 43 min              Past Medical History:  Diagnosis Date   Anxiety    Arthritis    si joints   Bipolar disorder ([place])    Chicken pox    as child   Depressive disorder 02/08/2019   History of COVID-19 09/09/2020   g i issues x 2-3 weeks all symptoms reolved   History of hypothyroidism    yrs ago per pt on 04-17-2021   Pneumonia    yrs ago per pt on 04-17-2021   Wears glasses    Past Surgical History:  Procedure Laterality Date   ANTERIOR AND POSTERIOR REPAIR N/A 04/23/2021   Procedure: POSTERIOR REPAIR (RECTOCELE);  Surgeon: Paula Compton, MD;  Location: [facility];  Service: Gynecology;  Laterality: N/A;   BIOPSY  07/25/2021   Procedure: BIOPSY;  Surgeon: [name], MD;  [facility];  Service: Gastroenterology;;   COLONOSCOPY WITH PROPOFOL N/A 07/25/2021   Procedure: COLONOSCOPY WITH PROPOFOL;  Surgeon: [name], MD;  Location: [facility];  Service: Gastroenterology;  Laterality: N/A;   CYSTOSCOPY N/A 04/23/2021   Procedure: CYSTOSCOPY;  Surgeon: Paula Compton, MD;  Location: [facility];  Service: Gynecology;  Laterality: N/A;   ESOPHAGOGASTRODUODENOSCOPY (EGD) WITH PROPOFOL N/A 07/25/2021   Procedure: ESOPHAGOGASTRODUODENOSCOPY (EGD) WITH PROPOFOL;  Surgeon: [name], MD;  [facility];  Service: Gastroenterology;  Laterality: N/A;   LAPAROSCOPIC VAGINAL HYSTERECTOMY WITH SALPINGECTOMY Bilateral 04/23/2021   Procedure: LAPAROSCOPIC ASSISTED  VAGINAL HYSTERECTOMY WITH BILATERAL  SALPINGECTOMY;  Surgeon: Paula Compton, MD;  Location: [place];  Service: Gynecology;  Laterality: Bilateral;   TONSILLECTOMY Bilateral 2020   TRANSFORAMINAL LUMBAR INTERBODY FUSION (TLIF) WITH PEDICLE SCREW FIXATION 1 LEVEL N/A 05/07/2019   Procedure: Lumbar Five- Sacral One Transforaminal lumbar interbody Fusion;  Surgeon: Erline Levine, MD;  Location: [place];  Service: Neurosurgery;  Laterality: N/A;  Lumbar 5 Sacral 1 Transforaminal lumbar interbody fusion   Patient Active Problem List   Diagnosis Date Noted   Iron deficiency anemia 10/17/2021   Lumbar post-laminectomy syndrome 10/17/2021   Lumbar spondylosis 10/17/2021   S/P laparoscopic assisted vaginal hysterectomy (LAVH) 04/23/2021   Dyspareunia in female 04/11/2021   Dysmenorrhea 04/11/2021   Exocrine pancreatic insufficiency 03/23/2021   Panic attacks 03/20/2020   Status post lumbar spinal fusion 03/20/2020   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   Lumbar foraminal stenosis 05/07/2019   GAD (generalized anxiety disorder) 03/10/2019   OCD (obsessive compulsive disorder) 03/10/2019   Major depression, recurrent, chronic ([place]) 02/08/2019    REFERRING DIAG: M54.2 (ICD-10-CM) - Cervicalgia   THERAPY DIAG:  Cervicalgia  Muscle weakness  Muscle weakness (generalized)  Rationale for Evaluation and Treatment Rehabilitation  PERTINENT HISTORY: Lumbar fusion, post-laminectomy syndrome     PRECAUTIONS: None  SUBJECTIVE:  SUBJECTIVE STATEMENT:  Pt presents to PT with reports of continued slight neck discomfort and headache symptoms over the weekend. Notes headache distribution in rams horn pattern. Pt is ready to begin PT at this time.    PAIN:  Are you having pain? Yes Pain location:  neck and headache NPRS scale:  1/10 to 7/10 Aggravating factors: light Relieving factors: no relieving factors Pain description:  "really bad tension headache" Stage: Subacute Stability: staying the same 24 hour pattern: better in the morning, early afternoon it gets worse    OBJECTIVE: (objective measures completed at initial evaluation unless otherwise dated)  DIAGNOSTIC FINDINGS:  MRI Cx   IMPRESSION: 1. Moderate left foraminal stenosis at C3-4 secondary to asymmetric left-sided uncovertebral and facet hypertrophy. 2. Mild left foraminal narrowing at C4-5 secondary to uncovertebral and facet hypertrophy. 3. Mild uncovertebral spurring bilaterally at C5-6 and C6-7 without significant stenosis. 4. No focal osseous lesions are present. The spinous process of C7 is likely palpable. 5. 2.4 x 2.4 cm nodule in the left lobe of the thyroid, previously biopsied at [facility]. This has been evaluated on previous imaging. (ref: J Am Coll Radiol. 2015 Feb;12(2): 143-50).   GENERAL OBSERVATION:          Forward head, rounded shoulders, increased kyphosis in sitting                               SENSATION:          Light touch: Appears intact on exam, subjective report of N/T C6 C7           PALPATION: TTP with * pain sub occipitals, palpable nodule mid Cx spine   Cervical ROM   ROM ROM  07/05/2022  Flexion 60  Extension 50  Right lateral flexion 45  Left lateral flexion 30  Right rotation 60*  Left rotation 40*  Flexion rotation (normal is 30 degrees) N  Flexion rotation (normal is 30 degrees) N    (Blank rows = not tested, N = WNL, * = concordant pain)   UPPER EXTREMITY MMT:   MMT Right 07/05/2022 Left 07/05/2022  Shoulder flexion      Shoulder abduction (C5)      Shoulder ER      Shoulder IR      Middle trapezius 3+ 3+  Lower trapezius 3 3  Shoulder extension      Grip strength 40 60  Cervical flexion (C1,C2)      Cervical S/B (C3)      Shoulder shrug (C4) c c   Elbow flexion (C6) c c  Elbow ext (C7) c c  Thumb ext (C8) c c  Finger abd (T1) c c  Grossly        (Blank rows = not tested, score listed is out of 5 possible points.  N = WNL, D = diminished, C = clear for gross weakness with myotome testing, * = concordant pain with testing)     JOINT MOBILITY TESTING:  WNL   PATIENT SURVEYS:  FOTO 61 -> 72              TODAY'S TREATMENT:  Therapeutic Exercise: UBE lvl 1.0 x 4 min while taking subjective  Seated low row 2x10 15# Prone Y/T 2x10 each Chin tuck x 10 - 5" hold  Manual Therapy: Skilled palpation to identify trigger points for TDN STM to all listed muscles following TDN  Suboccipital release Positional release  bilateral upper traps  Trigger Point Dry-Needling:  Treatment instructions: Expect mild to moderate muscle soreness. S/S of pneumothorax if dry needled over a lung field, and to seek immediate medical attention should they occur. Patient verbalized understanding of these instructions and education.   Patient Consent Given: Yes Education handout provided: No Muscles treated: bil sub occipitals, bil UT Electrical stimulation performed: YES Parameters: N/a Treatment response/outcome: twitch, pain relief                  PATIENT EDUCATION:  POC, diagnosis, prognosis, HEP, and outcome measures.  Pt educated via explanation, demonstration, and handout (HEP).  Pt confirms understanding verbally.             HOME EXERCISE PROGRAM: Access Code: [email or web address] URL: [email or web address] Date: 07/15/2022 Prepared by: Octavio Manns  Exercises - Seated Passive Cervical Retraction  - 1 x daily - 7 x weekly - 3 sets - 10 reps - Shoulder External Rotation and Scapular Retraction with Resistance  - 1 x daily - 7 x weekly - 2 sets - 10 reps - red theraband hold - Standing Shoulder Horizontal Abduction with Resistance  - 1 x daily - 7 x weekly - 2 sets - 10 reps - red theraband hold - Standing Shoulder Row with Anchored  Resistance  - 1 x daily - 7 x weekly - 3 sets - 10 reps - green theraband hold - Prone Scapular Retraction Y  - 1 x daily - 7 x weekly - 2-3 sets - 10 reps - Prone T  - 1 x daily - 7 x weekly - 2-3 sets - 10 reps   ASTERISK SIGNS     Asterisk Signs Eval (07/05/2022)            Headaches Constant up to 7/10            Mid trap 3+ bil            Low trap 3 bil            L rotation 40 degrees                               ASSESSMENT:   CLINICAL IMPRESSION: Pt able to complete all precribed exercises no adverse effect. Responded well to TPDN and manual therapy/ Exercises focused on improving periscapular and DNF strength in order to decrease pain. HEP updated, will continue per POC.     OBJECTIVE IMPAIRMENTS: Pain, cervical ROM   ACTIVITY LIMITATIONS: Reading, head turns, ADLs   PERSONAL FACTORS: See medical history and pertinent history     GOALS:     SHORT TERM GOALS: Target date: 08/02/2022   Montoya will be >75% HEP compliant to improve carryover between sessions and facilitate independent management of condition   Evaluation (07/05/2022): ongoing Goal status: INITIAL     LONG TERM GOALS: Target date: 08/30/2022   Ashlley will improve FOTO score to 72 as a proxy for functional improvement   Evaluation/Baseline (07/05/2022): 61 Goal status: INITIAL     2.  Meleena will self report >/= 50% decrease in pain from evaluation    Evaluation/Baseline (07/05/2022): 7/10 max pain Goal status: INITIAL     3.  Maize will improve the following MMTs to >/= 4/5 to show improvement in strength:  mid and lower traps    Evaluation/Baseline (07/05/2022): see chart in note Goal status: INITIAL     4.  Kalie will improve cervical  L rotation to >/= 50 degrees   Evaluation/Baseline (07/05/2022): 40 degrees Goal status: INITIAL     5.  Elayne will improve HA frequency by 75%   Evaluation/Baseline (07/05/2022): constant Goal status: INITIAL     PLAN: PT FREQUENCY: 1-2x/week   PT  DURATION: 8 weeks (Ending 08/30/2022)   PLANNED INTERVENTIONS: Therapeutic exercises, Aquatic therapy, Therapeutic activity, Neuro Muscular re-education, Gait training, Patient/Family education, Joint mobilization, Dry Needling, Electrical stimulation, Spinal mobilization and/or manipulation, Moist heat, Taping, Vasopneumatic device, Ionotophoresis '4mg'$ /ml Dexamethasone, and Manual therapy   PLAN FOR NEXT SESSION: cervical and periscapular strengthening, TDN   Ward Chatters, PT 07/15/2022, 9:59 AM

## 2022-07-17 ENCOUNTER — Encounter: Payer: Self-pay | Admitting: Physical Therapy

## 2022-07-17 ENCOUNTER — Ambulatory Visit: Payer: 59 | Admitting: Physical Therapy

## 2022-07-17 DIAGNOSIS — M542 Cervicalgia: Secondary | ICD-10-CM

## 2022-07-17 DIAGNOSIS — R279 Unspecified lack of coordination: Secondary | ICD-10-CM

## 2022-07-17 DIAGNOSIS — M6281 Muscle weakness (generalized): Secondary | ICD-10-CM

## 2022-07-17 NOTE — Therapy (Signed)
OUTPATIENT PHYSICAL THERAPY TREATMENT NOTE   Patient Name: Rebecca Holt MRN: [id number] DOB:1985/09/29, 37 y.o., female Today's Date: 07/17/2022  PCP: Karsten Ro, DO  REFERRING PROVIDER: Karsten Ro, DO   END OF SESSION:   PT End of Session - 07/17/22 0955     Visit Number 5    Number of Visits --   1-2x/week   Date for PT Re-Evaluation 08/30/22    Authorization Type Aetna    PT Start Time 1000    PT Stop Time 1040    PT Time Calculation (min) 40 min              Past Medical History:  Diagnosis Date   Anxiety    Arthritis    si joints   Bipolar disorder ([place])    Chicken pox    as child   Depressive disorder 02/08/2019   History of COVID-19 09/09/2020   g i issues x 2-3 weeks all symptoms reolved   History of hypothyroidism    yrs ago per pt on 04-17-2021   Pneumonia    yrs ago per pt on 04-17-2021   Wears glasses    Past Surgical History:  Procedure Laterality Date   ANTERIOR AND POSTERIOR REPAIR N/A 04/23/2021   Procedure: POSTERIOR REPAIR (RECTOCELE);  Surgeon: Paula Compton, MD;  Location: [facility];  Service: Gynecology;  Laterality: N/A;   BIOPSY  07/25/2021   Procedure: BIOPSY;  Surgeon: [name], MD;  Location: [facility];  Service: Gastroenterology;;   COLONOSCOPY WITH PROPOFOL N/A 07/25/2021   Procedure: COLONOSCOPY WITH PROPOFOL;  Surgeon: [name], MD;  Location: [facility];  Service: Gastroenterology;  Laterality: N/A;   CYSTOSCOPY N/A 04/23/2021   Procedure: CYSTOSCOPY;  Surgeon: Paula Compton, MD;  Location: [facility];  Service: Gynecology;  Laterality: N/A;   ESOPHAGOGASTRODUODENOSCOPY (EGD) WITH PROPOFOL N/A 07/25/2021   Procedure: ESOPHAGOGASTRODUODENOSCOPY (EGD) WITH PROPOFOL;  Surgeon: [name], MD;  Location: [facility];  Service: Gastroenterology;  Laterality: N/A;   LAPAROSCOPIC VAGINAL HYSTERECTOMY WITH SALPINGECTOMY Bilateral 04/23/2021   Procedure: LAPAROSCOPIC ASSISTED  VAGINAL HYSTERECTOMY WITH BILATERAL  SALPINGECTOMY;  Surgeon: Paula Compton, MD;  Location: [place];  Service: Gynecology;  Laterality: Bilateral;   TONSILLECTOMY Bilateral 2020   TRANSFORAMINAL LUMBAR INTERBODY FUSION (TLIF) WITH PEDICLE SCREW FIXATION 1 LEVEL N/A 05/07/2019   Procedure: Lumbar Five- Sacral One Transforaminal lumbar interbody Fusion;  Surgeon: Erline Levine, MD;  Location: [place];  Service: Neurosurgery;  Laterality: N/A;  Lumbar 5 Sacral 1 Transforaminal lumbar interbody fusion   Patient Active Problem List   Diagnosis Date Noted   Iron deficiency anemia 10/17/2021   Lumbar post-laminectomy syndrome 10/17/2021   Lumbar spondylosis 10/17/2021   S/P laparoscopic assisted vaginal hysterectomy (LAVH) 04/23/2021   Dyspareunia in female 04/11/2021   Dysmenorrhea 04/11/2021   Exocrine pancreatic insufficiency 03/23/2021   Panic attacks 03/20/2020   Status post lumbar spinal fusion 03/20/2020   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   Lumbar foraminal stenosis 05/07/2019   GAD (generalized anxiety disorder) 03/10/2019   OCD (obsessive compulsive disorder) 03/10/2019   Major depression, recurrent, chronic ([place]) 02/08/2019    REFERRING DIAG: M54.2 (ICD-10-CM) - Cervicalgia   THERAPY DIAG:  Cervicalgia  Muscle weakness  Muscle weakness (generalized)  Unspecified lack of coordination  Rationale for Evaluation and Treatment Rehabilitation  PERTINENT HISTORY: Lumbar fusion, post-laminectomy syndrome     PRECAUTIONS: None  SUBJECTIVE:  SUBJECTIVE STATEMENT:  Pt states that her pain was significantly improved after last visit. Her current pain is a 2/10.   PAIN:  Are you having pain? Yes Pain location: neck and headache NPRS scale:  1/10 to 7/10 Aggravating  factors: light Relieving factors: no relieving factors Pain description:  "really bad tension headache" Stage: Subacute Stability: staying the same 24 hour pattern: better in the morning, early afternoon it gets worse    OBJECTIVE: (objective measures completed at initial evaluation unless otherwise dated)  DIAGNOSTIC FINDINGS:  MRI Cx   IMPRESSION: 1. Moderate left foraminal stenosis at C3-4 secondary to asymmetric left-sided uncovertebral and facet hypertrophy. 2. Mild left foraminal narrowing at C4-5 secondary to uncovertebral and facet hypertrophy. 3. Mild uncovertebral spurring bilaterally at C5-6 and C6-7 without significant stenosis. 4. No focal osseous lesions are present. The spinous process of C7 is likely palpable. 5. 2.4 x 2.4 cm nodule in the left lobe of the thyroid, previously biopsied at [facility]. This has been evaluated on previous imaging. (ref: J Am Coll Radiol. 2015 Feb;12(2): 143-50).   GENERAL OBSERVATION:          Forward head, rounded shoulders, increased kyphosis in sitting                               SENSATION:          Light touch: Appears intact on exam, subjective report of N/T C6 C7           PALPATION: TTP with * pain sub occipitals, palpable nodule mid Cx spine   Cervical ROM   ROM ROM  07/05/2022  Flexion 60  Extension 50  Right lateral flexion 45  Left lateral flexion 30  Right rotation 60*  Left rotation 40*  Flexion rotation (normal is 30 degrees) N  Flexion rotation (normal is 30 degrees) N    (Blank rows = not tested, N = WNL, * = concordant pain)   UPPER EXTREMITY MMT:   MMT Right 07/05/2022 Left 07/05/2022  Shoulder flexion      Shoulder abduction (C5)      Shoulder ER      Shoulder IR      Middle trapezius 3+ 3+  Lower trapezius 3 3  Shoulder extension      Grip strength 40 60  Cervical flexion (C1,C2)      Cervical S/B (C3)      Shoulder shrug (C4) c c  Elbow flexion (C6) c c  Elbow ext (C7) c c  Thumb ext  (C8) c c  Finger abd (T1) c c  Grossly        (Blank rows = not tested, score listed is out of 5 possible points.  N = WNL, D = diminished, C = clear for gross weakness with myotome testing, * = concordant pain with testing)     JOINT MOBILITY TESTING:  WNL   PATIENT SURVEYS:  FOTO 61 -> 72              TODAY'S TREATMENT:  Therapeutic Exercise: UBE lvl 2.0 x 5 min while taking subjective  Seated low row 3x10 20# Prone Y/T 2x12 each Chin tuck x 10 - 5" hold  Manual Therapy: Skilled palpation to identify trigger points for TDN STM to all listed muscles following TDN  Suboccipital release Positional release bilateral upper traps  Trigger Point Dry-Needling:  Treatment instructions: Expect mild to moderate muscle soreness. S/S  of pneumothorax if dry needled over a lung field, and to seek immediate medical attention should they occur. Patient verbalized understanding of these instructions and education.   Patient Consent Given: Yes Education handout provided: No Muscles treated: bil sub occipitals, bil UT Electrical stimulation performed: NO Parameters: N/a Treatment response/outcome: twitch, pain relief                  PATIENT EDUCATION:  POC, diagnosis, prognosis, HEP, and outcome measures.  Pt educated via explanation, demonstration, and handout (HEP).  Pt confirms understanding verbally.             HOME EXERCISE PROGRAM: Access Code: [email or web address] URL: [email or web address] Date: 07/15/2022 Prepared by: Octavio Manns  Exercises - Seated Passive Cervical Retraction  - 1 x daily - 7 x weekly - 3 sets - 10 reps - Shoulder External Rotation and Scapular Retraction with Resistance  - 1 x daily - 7 x weekly - 2 sets - 10 reps - red theraband hold - Standing Shoulder Horizontal Abduction with Resistance  - 1 x daily - 7 x weekly - 2 sets - 10 reps - red theraband hold - Standing Shoulder Row with Anchored Resistance  - 1 x daily - 7 x weekly - 3 sets - 10 reps -  green theraband hold - Prone Scapular Retraction Y  - 1 x daily - 7 x weekly - 2-3 sets - 10 reps - Prone T  - 1 x daily - 7 x weekly - 2-3 sets - 10 reps   ASTERISK SIGNS     Asterisk Signs Eval (07/05/2022)            Headaches Constant up to 7/10            Mid trap 3+ bil            Low trap 3 bil            L rotation 40 degrees                               ASSESSMENT:   CLINICAL IMPRESSION: Pt able to complete all precribed exercises no adverse effect. Responded well to TPDN and manual therapy.  Added intensity to row and volume to Y/T.  Pt reports significant reduction in HA sxs following therapy.   OBJECTIVE IMPAIRMENTS: Pain, cervical ROM   ACTIVITY LIMITATIONS: Reading, head turns, ADLs   PERSONAL FACTORS: See medical history and pertinent history     GOALS:     SHORT TERM GOALS: Target date: 08/02/2022   Zakiyyah will be >75% HEP compliant to improve carryover between sessions and facilitate independent management of condition   Evaluation (07/05/2022): ongoing Goal status: INITIAL     LONG TERM GOALS: Target date: 08/30/2022   Tamu will improve FOTO score to 72 as a proxy for functional improvement   Evaluation/Baseline (07/05/2022): 61 Goal status: INITIAL     2.  Laelia will self report >/= 50% decrease in pain from evaluation    Evaluation/Baseline (07/05/2022): 7/10 max pain Goal status: INITIAL     3.  Bricelyn will improve the following MMTs to >/= 4/5 to show improvement in strength:  mid and lower traps    Evaluation/Baseline (07/05/2022): see chart in note Goal status: INITIAL     4.  Janetta will improve cervical L rotation to >/= 50 degrees   Evaluation/Baseline (07/05/2022): 40 degrees Goal status: INITIAL  5.  Eliese will improve HA frequency by 75%   Evaluation/Baseline (07/05/2022): constant Goal status: INITIAL     PLAN: PT FREQUENCY: 1-2x/week   PT DURATION: 8 weeks (Ending 08/30/2022)   PLANNED INTERVENTIONS: Therapeutic exercises,  Aquatic therapy, Therapeutic activity, Neuro Muscular re-education, Gait training, Patient/Family education, Joint mobilization, Dry Needling, Electrical stimulation, Spinal mobilization and/or manipulation, Moist heat, Taping, Vasopneumatic device, Ionotophoresis '4mg'$ /ml Dexamethasone, and Manual therapy   PLAN FOR NEXT SESSION: cervical and periscapular strengthening, TDN   Mathis Dad, PT 07/17/2022, 10:42 AM

## 2022-07-22 ENCOUNTER — Ambulatory Visit: Payer: 59

## 2022-07-22 DIAGNOSIS — M6281 Muscle weakness (generalized): Secondary | ICD-10-CM

## 2022-07-22 DIAGNOSIS — M542 Cervicalgia: Secondary | ICD-10-CM

## 2022-07-22 NOTE — Therapy (Signed)
OUTPATIENT PHYSICAL THERAPY TREATMENT NOTE   Patient Name: Rebecca Holt MRN: [id number] DOB:Dec 04, 1985, 37 y.o., female Today's Date: 07/22/2022  PCP: Karsten Ro, DO  REFERRING PROVIDER: Dawley, Theodoro Doing, DO   END OF SESSION:   PT End of Session - 07/22/22 1128     Visit Number 6    Number of Visits --   1-2x/week   Date for PT Re-Evaluation 08/30/22    Authorization Type Aetna    PT Start Time 1130    PT Stop Time 1210    PT Time Calculation (min) 40 min    Activity Tolerance Patient tolerated treatment well    Behavior During Therapy [facility] for tasks assessed/performed               Past Medical History:  Diagnosis Date   Anxiety    Arthritis    si joints   Bipolar disorder ([place])    Chicken pox    as child   Depressive disorder 02/08/2019   History of COVID-19 09/09/2020   g i issues x 2-3 weeks all symptoms reolved   History of hypothyroidism    yrs ago per pt on 04-17-2021   Pneumonia    yrs ago per pt on 04-17-2021   Wears glasses    Past Surgical History:  Procedure Laterality Date   ANTERIOR AND POSTERIOR REPAIR N/A 04/23/2021   Procedure: POSTERIOR REPAIR (RECTOCELE);  Surgeon: Paula Compton, MD;  Location: [facility];  Service: Gynecology;  Laterality: N/A;   BIOPSY  07/25/2021   Procedure: BIOPSY;  Surgeon: [name], MD;  [facility];  Service: Gastroenterology;;   COLONOSCOPY WITH PROPOFOL N/A 07/25/2021   Procedure: COLONOSCOPY WITH PROPOFOL;  Surgeon: [name], MD;  Location: [facility];  Service: Gastroenterology;  Laterality: N/A;   CYSTOSCOPY N/A 04/23/2021   Procedure: CYSTOSCOPY;  Surgeon: Paula Compton, MD;  Location: [facility];  Service: Gynecology;  Laterality: N/A;   ESOPHAGOGASTRODUODENOSCOPY (EGD) WITH PROPOFOL N/A 07/25/2021   Procedure: ESOPHAGOGASTRODUODENOSCOPY (EGD) WITH PROPOFOL;  Surgeon: [name], MD;  Location: [facility];  Service: Gastroenterology;  Laterality:  N/A;   LAPAROSCOPIC VAGINAL HYSTERECTOMY WITH SALPINGECTOMY Bilateral 04/23/2021   Procedure: LAPAROSCOPIC ASSISTED VAGINAL HYSTERECTOMY WITH BILATERAL  SALPINGECTOMY;  Surgeon: Paula Compton, MD;  Location: [place];  Service: Gynecology;  Laterality: Bilateral;   TONSILLECTOMY Bilateral 2020   TRANSFORAMINAL LUMBAR INTERBODY FUSION (TLIF) WITH PEDICLE SCREW FIXATION 1 LEVEL N/A 05/07/2019   Procedure: Lumbar Five- Sacral One Transforaminal lumbar interbody Fusion;  Surgeon: Erline Levine, MD;  Location: [place];  Service: Neurosurgery;  Laterality: N/A;  Lumbar 5 Sacral 1 Transforaminal lumbar interbody fusion   Patient Active Problem List   Diagnosis Date Noted   Iron deficiency anemia 10/17/2021   Lumbar post-laminectomy syndrome 10/17/2021   Lumbar spondylosis 10/17/2021   S/P laparoscopic assisted vaginal hysterectomy (LAVH) 04/23/2021   Dyspareunia in female 04/11/2021   Dysmenorrhea 04/11/2021   Exocrine pancreatic insufficiency 03/23/2021   Panic attacks 03/20/2020   Status post lumbar spinal fusion 03/20/2020   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   Lumbar foraminal stenosis 05/07/2019   GAD (generalized anxiety disorder) 03/10/2019   OCD (obsessive compulsive disorder) 03/10/2019   Major depression, recurrent, chronic ([place]) 02/08/2019    REFERRING DIAG: M54.2 (ICD-10-CM) - Cervicalgia   THERAPY DIAG:  Cervicalgia  Muscle weakness  Rationale for Evaluation and Treatment Rehabilitation  PERTINENT HISTORY: Lumbar fusion, post-laminectomy syndrome     PRECAUTIONS: None  SUBJECTIVE:  SUBJECTIVE STATEMENT:  Pt presents to PT with continued improvement in symptoms. Has continued HEP compliance. Ready to begin PT at this time.    PAIN:  Are you having pain? Yes Pain  location: neck and headache NPRS scale:  1/10 to 7/10 Aggravating factors: light Relieving factors: no relieving factors Pain description:  "really bad tension headache" Stage: Subacute Stability: staying the same 24 hour pattern: better in the morning, early afternoon it gets worse    OBJECTIVE: (objective measures completed at initial evaluation unless otherwise dated)  DIAGNOSTIC FINDINGS:  MRI Cx   IMPRESSION: 1. Moderate left foraminal stenosis at C3-4 secondary to asymmetric left-sided uncovertebral and facet hypertrophy. 2. Mild left foraminal narrowing at C4-5 secondary to uncovertebral and facet hypertrophy. 3. Mild uncovertebral spurring bilaterally at C5-6 and C6-7 without significant stenosis. 4. No focal osseous lesions are present. The spinous process of C7 is likely palpable. 5. 2.4 x 2.4 cm nodule in the left lobe of the thyroid, previously biopsied at [facility]. This has been evaluated on previous imaging. (ref: J Am Coll Radiol. 2015 Feb;12(2): 143-50).   GENERAL OBSERVATION:          Forward head, rounded shoulders, increased kyphosis in sitting                               SENSATION:          Light touch: Appears intact on exam, subjective report of N/T C6 C7           PALPATION: TTP with * pain sub occipitals, palpable nodule mid Cx spine   Cervical ROM   ROM ROM  07/05/2022  Flexion 60  Extension 50  Right lateral flexion 45  Left lateral flexion 30  Right rotation 60*  Left rotation 40*  Flexion rotation (normal is 30 degrees) N  Flexion rotation (normal is 30 degrees) N    (Blank rows = not tested, N = WNL, * = concordant pain)   UPPER EXTREMITY MMT:   MMT Right 07/05/2022 Left 07/05/2022  Shoulder flexion      Shoulder abduction (C5)      Shoulder ER      Shoulder IR      Middle trapezius 3+ 3+  Lower trapezius 3 3  Shoulder extension      Grip strength 40 60  Cervical flexion (C1,C2)      Cervical S/B (C3)      Shoulder shrug  (C4) c c  Elbow flexion (C6) c c  Elbow ext (C7) c c  Thumb ext (C8) c c  Finger abd (T1) c c  Grossly        (Blank rows = not tested, score listed is out of 5 possible points.  N = WNL, D = diminished, C = clear for gross weakness with myotome testing, * = concordant pain with testing)     JOINT MOBILITY TESTING:  WNL   PATIENT SURVEYS:  FOTO 61 -> 72              TODAY'S TREATMENT:  Therapeutic Exercise: UBE lvl 2.0 x 3 min while taking subjective  Standing row 3x10 13# Seated low row 2x10 20# Lat pulldown 2x10 20# Prone Y/T 2x12 each Prone W with ext x 10  Chin tuck x 10 - 5" hold Manual Therapy: Skilled palpation to identify trigger points for TDN STM to all listed muscles following TDN  Suboccipital release Positional  release bilateral upper traps Trigger Point Dry-Needling:  Treatment instructions: Expect mild to moderate muscle soreness. S/S of pneumothorax if dry needled over a lung field, and to seek immediate medical attention should they occur. Patient verbalized understanding of these instructions and education.   Patient Consent Given: Yes Education handout provided: No Muscles treated: cervical paraspinals, bil UT Electrical stimulation performed: NO Parameters: N/a Treatment response/outcome: twitch, pain relief                 PATIENT EDUCATION:  POC, diagnosis, prognosis, HEP, and outcome measures.  Pt educated via explanation, demonstration, and handout (HEP).  Pt confirms understanding verbally.             HOME EXERCISE PROGRAM: Access Code: [email or web address] URL: [email or web address] Date: 07/15/2022 Prepared by: Octavio Manns  Exercises - Seated Passive Cervical Retraction  - 1 x daily - 7 x weekly - 3 sets - 10 reps - Shoulder External Rotation and Scapular Retraction with Resistance  - 1 x daily - 7 x weekly - 2 sets - 10 reps - red theraband hold - Standing Shoulder Horizontal Abduction with Resistance  - 1 x daily - 7 x weekly - 2  sets - 10 reps - red theraband hold - Standing Shoulder Row with Anchored Resistance  - 1 x daily - 7 x weekly - 3 sets - 10 reps - green theraband hold - Prone Scapular Retraction Y  - 1 x daily - 7 x weekly - 2-3 sets - 10 reps - Prone T  - 1 x daily - 7 x weekly - 2-3 sets - 10 reps   ASTERISK SIGNS     Asterisk Signs Eval (07/05/2022)            Headaches Constant up to 7/10            Mid trap 3+ bil            Low trap 3 bil            L rotation 40 degrees                               ASSESSMENT:   CLINICAL IMPRESSION: Pt able to complete all precribed exercises no adverse effect. Responded well to TPDN and manual therapy/ Exercises focused on improving periscapular and DNF strength in order to decrease pain. Will continue per POC.    OBJECTIVE IMPAIRMENTS: Pain, cervical ROM   ACTIVITY LIMITATIONS: Reading, head turns, ADLs   PERSONAL FACTORS: See medical history and pertinent history     GOALS:     SHORT TERM GOALS: Target date: 08/02/2022   Chaniya will be >75% HEP compliant to improve carryover between sessions and facilitate independent management of condition   Evaluation (07/05/2022): ongoing Goal status: INITIAL     LONG TERM GOALS: Target date: 08/30/2022   Brianna will improve FOTO score to 72 as a proxy for functional improvement   Evaluation/Baseline (07/05/2022): 61 Goal status: INITIAL     2.  Arijana will self report >/= 50% decrease in pain from evaluation    Evaluation/Baseline (07/05/2022): 7/10 max pain Goal status: INITIAL     3.  Sheray will improve the following MMTs to >/= 4/5 to show improvement in strength:  mid and lower traps    Evaluation/Baseline (07/05/2022): see chart in note Goal status: INITIAL     4.  Mali will improve cervical L rotation to >/= 50  degrees   Evaluation/Baseline (07/05/2022): 40 degrees Goal status: INITIAL     5.  Misk will improve HA frequency by 75%   Evaluation/Baseline (07/05/2022): constant Goal status:  INITIAL     PLAN: PT FREQUENCY: 1-2x/week   PT DURATION: 8 weeks (Ending 08/30/2022)   PLANNED INTERVENTIONS: Therapeutic exercises, Aquatic therapy, Therapeutic activity, Neuro Muscular re-education, Gait training, Patient/Family education, Joint mobilization, Dry Needling, Electrical stimulation, Spinal mobilization and/or manipulation, Moist heat, Taping, Vasopneumatic device, Ionotophoresis '4mg'$ /ml Dexamethasone, and Manual therapy   PLAN FOR NEXT SESSION: cervical and periscapular strengthening, TDN   Ward Chatters, PT 07/22/2022, 12:11 PM

## 2022-07-25 ENCOUNTER — Ambulatory Visit: Payer: 59 | Admitting: Physical Therapy

## 2022-07-25 ENCOUNTER — Encounter: Payer: Self-pay | Admitting: Physical Therapy

## 2022-07-25 DIAGNOSIS — R279 Unspecified lack of coordination: Secondary | ICD-10-CM

## 2022-07-25 DIAGNOSIS — M542 Cervicalgia: Secondary | ICD-10-CM | POA: Diagnosis not present

## 2022-07-25 DIAGNOSIS — M6281 Muscle weakness (generalized): Secondary | ICD-10-CM

## 2022-07-25 NOTE — Therapy (Signed)
OUTPATIENT PHYSICAL THERAPY TREATMENT NOTE   Patient Name: Rebecca Holt MRN: [id number] DOB:Dec 12, 1985, 37 y.o., female Today's Date: 07/25/2022  PCP: Karsten Ro, DO  REFERRING PROVIDER: Dawley, Theodoro Doing, DO   END OF SESSION:   PT End of Session - 07/25/22 0831     Visit Number 7    Number of Visits --   1-2x/week   Date for PT Re-Evaluation 08/30/22    Authorization Type Aet[employer]    PT Start Time 0830    PT Stop Time 0911    PT Time Calculation (min) 41 min    Activity Tolerance Patient tolerated treatment well    Behavior During Therapy [facility] for tasks assessed/performed               Past Medical History:  Diagnosis Date   Anxiety    Arthritis    si joints   Bipolar disorder ([place])    Chicken pox    as child   Depressive disorder 02/08/2019   History of COVID-19 09/09/2020   g i issues x 2-3 weeks all symptoms reolved   History of hypothyroidism    yrs ago per pt on 04-17-2021   Pneumonia    yrs ago per pt on 04-17-2021   Wears glasses    Past Surgical History:  Procedure Laterality Date   ANTERIOR AND POSTERIOR REPAIR N/A 04/23/2021   Procedure: POSTERIOR REPAIR (RECTOCELE);  Surgeon: Paula Compton, MD;  Location: [facility];  Service: Gynecology;  Laterality: N/A;   BIOPSY  07/25/2021   Procedure: BIOPSY;  Surgeon: [name], MD;  Location: [facility];  Service: Gastroenterology;;   COLONOSCOPY WITH PROPOFOL N/A 07/25/2021   Procedure: COLONOSCOPY WITH PROPOFOL;  Surgeon: [name], MD;  Location: [facility];  Service: Gastroenterology;  Laterality: N/A;   CYSTOSCOPY N/A 04/23/2021   Procedure: CYSTOSCOPY;  Surgeon: Paula Compton, MD;  Location: [facility];  Service: Gynecology;  Laterality: N/A;   ESOPHAGOGASTRODUODENOSCOPY (EGD) WITH PROPOFOL N/A 07/25/2021   Procedure: ESOPHAGOGASTRODUODENOSCOPY (EGD) WITH PROPOFOL;  Surgeon: [name], MD;  Location: [facility];  Service: Gastroenterology;  Laterality:  N/A;   LAPAROSCOPIC VAGINAL HYSTERECTOMY WITH SALPINGECTOMY Bilateral 04/23/2021   Procedure: LAPAROSCOPIC ASSISTED VAGINAL HYSTERECTOMY WITH BILATERAL  SALPINGECTOMY;  Surgeon: Paula Compton, MD;  Location: [place];  Service: Gynecology;  Laterality: Bilateral;   TONSILLECTOMY Bilateral 2020   TRANSFORAMINAL LUMBAR INTERBODY FUSION (TLIF) WITH PEDICLE SCREW FIXATION 1 LEVEL N/A 05/07/2019   Procedure: Lumbar Five- Sacral One Transforaminal lumbar interbody Fusion;  Surgeon: Erline Levine, MD;  Location: [place];  Service: Neurosurgery;  Laterality: N/A;  Lumbar 5 Sacral 1 Transforaminal lumbar interbody fusion   Patient Active Problem List   Diagnosis Date Noted   Iron deficiency anemia 10/17/2021   Lumbar post-laminectomy syndrome 10/17/2021   Lumbar spondylosis 10/17/2021   S/P laparoscopic assisted vaginal hysterectomy (LAVH) 04/23/2021   Dyspareunia in female 04/11/2021   Dysmenorrhea 04/11/2021   Exocrine pancreatic insufficiency 03/23/2021   Panic attacks 03/20/2020   Status post lumbar spinal fusion 03/20/2020   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   Lumbar foraminal stenosis 05/07/2019   GAD (generalized anxiety disorder) 03/10/2019   OCD (obsessive compulsive disorder) 03/10/2019   Major depression, recurrent, chronic ([place]) 02/08/2019    REFERRING DIAG: M54.2 (ICD-10-CM) - Cervicalgia   THERAPY DIAG:  Cervicalgia  Muscle weakness  Muscle weakness (generalized)  Unspecified lack of coordination  Rationale for Evaluation and Treatment Rehabilitation  PERTINENT HISTORY: Lumbar fusion, post-laminectomy syndrome  PRECAUTIONS: None  SUBJECTIVE:                                                                                                                                                                                      SUBJECTIVE STATEMENT:  Pt reports that she continues to see progress with TDN and exercise.   PAIN:  Are you having  pain? Yes Pain location: neck and headache NPRS scale:  1/10 to 7/10 Aggravating factors: light Relieving factors: no relieving factors Pain description:  "really bad tension headache" Stage: Subacute Stability: staying the same 24 hour pattern: better in the morning, early afternoon it gets worse    OBJECTIVE: (objective measures completed at initial evaluation unless otherwise dated)  DIAGNOSTIC FINDINGS:  MRI Cx   IMPRESSION: 1. Moderate left foraminal stenosis at C3-4 secondary to asymmetric left-sided uncovertebral and facet hypertrophy. 2. Mild left foraminal narrowing at C4-5 secondary to uncovertebral and facet hypertrophy. 3. Mild uncovertebral spurring bilaterally at C5-6 and C6-7 without significant stenosis. 4. No focal osseous lesions are present. The spinous process of C7 is likely palpable. 5. 2.4 x 2.4 cm nodule in the left lobe of the thyroid, previously biopsied at [facility]. This has been evaluated on previous imaging. (ref: J Am Coll Radiol. 2015 Feb;12(2): 143-50).   GENERAL OBSERVATION:          Forward head, rounded shoulders, increased kyphosis in sitting                               SENSATION:          Light touch: Appears intact on exam, subjective report of N/T C6 C7           PALPATION: TTP with * pain sub occipitals, palpable nodule mid Cx spine   Cervical ROM   ROM ROM  07/05/2022  Flexion 60  Extension 50  Right lateral flexion 45  Left lateral flexion 30  Right rotation 60*  Left rotation 40*  Flexion rotation (normal is 30 degrees) N  Flexion rotation (normal is 30 degrees) N    (Blank rows = not tested, N = WNL, * = concordant pain)   UPPER EXTREMITY MMT:   MMT Right 07/05/2022 Left 07/05/2022  Shoulder flexion      Shoulder abduction (C5)      Shoulder ER      Shoulder IR      Middle trapezius 3+ 3+  Lower trapezius 3 3  Shoulder extension      Grip strength 40 60  Cervical flexion (C1,C2)      Cervical S/B (C3)  Shoulder shrug (C4) c c  Elbow flexion (C6) c c  Elbow ext (C7) c c  Thumb ext (C8) c c  Finger abd (T1) c c  Grossly        (Blank rows = not tested, score listed is out of 5 possible points.  N = WNL, D = diminished, C = clear for gross weakness with myotome testing, * = concordant pain with testing)     JOINT MOBILITY TESTING:  WNL   PATIENT SURVEYS:  FOTO 61 -> 72              TODAY'S TREATMENT:  Therapeutic Exercise: UBE lvl 2.0 x 3 min while taking subjective  Standing alternating row 3x10 13# ea Seated high row 2x10 25# Lat pulldown 2x10 25# Prone Y/T/W 2x12 each Prone W with ext x 10  Chin tuck - ball on wall - with scaption - 2# 3x10  Consider: Push Oh press  Manual Therapy: Skilled palpation to identify trigger points for TDN STM to all listed muscles following TDN  Suboccipital release  Trigger Point Dry-Needling:  Treatment instructions: Expect mild to moderate muscle soreness. S/S of pneumothorax if dry needled over a lung field, and to seek immediate medical attention should they occur. Patient verbalized understanding of these instructions and education.   Patient Consent Given: Yes Education handout provided: No Muscles treated: cervical paraspinals, bil sub occipitals, bil UT Electrical stimulation performed: NO Parameters: N/a Treatment response/outcome: twitch, pain relief                 PATIENT EDUCATION:  POC, diagnosis, prognosis, HEP, and outcome measures.  Pt educated via explanation, demonstration, and handout (HEP).  Pt confirms understanding verbally.             HOME EXERCISE PROGRAM: Access Code: [email or web address] URL: [email or web address] Date: 07/15/2022 Prepared by: Octavio Manns  Exercises - Seated Passive Cervical Retraction  - 1 x daily - 7 x weekly - 3 sets - 10 reps - Shoulder External Rotation and Scapular Retraction with Resistance  - 1 x daily - 7 x weekly - 2 sets - 10 reps - red theraband hold - Standing  Shoulder Horizontal Abduction with Resistance  - 1 x daily - 7 x weekly - 2 sets - 10 reps - red theraband hold - Standing Shoulder Row with Anchored Resistance  - 1 x daily - 7 x weekly - 3 sets - 10 reps - green theraband hold - Prone Scapular Retraction Y  - 1 x daily - 7 x weekly - 2-3 sets - 10 reps - Prone T  - 1 x daily - 7 x weekly - 2-3 sets - 10 reps   ASTERISK SIGNS     Asterisk Signs Eval (07/05/2022)            Headaches Constant up to 7/10            Mid trap 3+ bil            Low trap 3 bil            L rotation 40 degrees                               ASSESSMENT:   CLINICAL IMPRESSION: Janyra tolerated session well with no adverse reaction.  Reports significant pain relief with TDN.  Exercises focused on improving periscapular and DNF strength in order to decrease pain.  Able to  progress most exercise intensity today. Will continue per POC.    OBJECTIVE IMPAIRMENTS: Pain, cervical ROM   ACTIVITY LIMITATIONS: Reading, head turns, ADLs   PERSONAL FACTORS: See medical history and pertinent history     GOALS:     SHORT TERM GOALS: Target date: 08/02/2022   Alany will be >75% HEP compliant to improve carryover between sessions and facilitate independent management of condition   Evaluation (07/05/2022): ongoing Goal status: INITIAL     LONG TERM GOALS: Target date: 08/30/2022   Samanatha will improve FOTO score to 72 as a proxy for functional improvement   Evaluation/Baseline (07/05/2022): 61 Goal status: INITIAL     2.  Taiana will self report >/= 50% decrease in pain from evaluation    Evaluation/Baseline (07/05/2022): 7/10 max pain Goal status: INITIAL     3.  Raylynn will improve the following MMTs to >/= 4/5 to show improvement in strength:  mid and lower traps    Evaluation/Baseline (07/05/2022): see chart in note Goal status: INITIAL     4.  Kassity will improve cervical L rotation to >/= 50 degrees   Evaluation/Baseline (07/05/2022): 40 degrees Goal  status: INITIAL     5.  Tokiko will improve HA frequency by 75%   Evaluation/Baseline (07/05/2022): constant Goal status: INITIAL     PLAN: PT FREQUENCY: 1-2x/week   PT DURATION: 8 weeks (Ending 08/30/2022)   PLANNED INTERVENTIONS: Therapeutic exercises, Aquatic therapy, Therapeutic activity, Neuro Muscular re-education, Gait training, Patient/Family education, Joint mobilization, Dry Needling, Electrical stimulation, Spinal mobilization and/or manipulation, Moist heat, Taping, Vasopneumatic device, Ionotophoresis '4mg'$ /ml Dexamethasone, and Manual therapy   PLAN FOR NEXT SESSION: cervical and periscapular strengthening, TDN   Mathis Dad, PT 07/25/2022, 9:12 AM

## 2022-07-29 ENCOUNTER — Ambulatory Visit: Payer: 59

## 2022-07-29 DIAGNOSIS — M542 Cervicalgia: Secondary | ICD-10-CM | POA: Diagnosis not present

## 2022-07-29 DIAGNOSIS — M6281 Muscle weakness (generalized): Secondary | ICD-10-CM

## 2022-07-29 NOTE — Therapy (Signed)
OUTPATIENT PHYSICAL THERAPY TREATMENT NOTE   Patient Name: Rebecca Holt MRN: [id number] DOB:[birth year], 37 y.o., female Today's Date: 07/29/2022  PCP: Karsten Ro, DO  REFERRING PROVIDER: Dawley, Theodoro Doing, DO   END OF SESSION:   PT End of Session - 07/29/22 0830     Visit Number 8    Number of Visits --   1-2x/week   Date for PT Re-Evaluation 08/30/22    Authorization Type Aet[employer]    PT Start Time 0830    PT Stop Time 0908    PT Time Calculation (min) 38 min    Activity Tolerance Patient tolerated treatment well    Behavior During Therapy [facility] for tasks assessed/performed                Past Medical History:  Diagnosis Date   Anxiety    Arthritis    si joints   Bipolar disorder ([place])    Chicken pox    as child   Depressive disorder 02/08/2019   History of COVID-19 09/09/2020   g i issues x 2-3 weeks all symptoms reolved   History of hypothyroidism    yrs ago per pt on 04-17-2021   Pneumonia    yrs ago per pt on 04-17-2021   Wears glasses    Past Surgical History:  Procedure Laterality Date   ANTERIOR AND POSTERIOR REPAIR N/A 04/23/2021   Procedure: POSTERIOR REPAIR (RECTOCELE);  Surgeon: Paula Compton, MD;  Location: [facility];  Service: Gynecology;  Laterality: N/A;   BIOPSY  07/25/2021   Procedure: BIOPSY;  Surgeon: [name], MD;  Location: [facility];  Service: Gastroenterology;;   COLONOSCOPY WITH PROPOFOL N/A 07/25/2021   Procedure: COLONOSCOPY WITH PROPOFOL;  Surgeon: [name], MD;  Location: [facility];  Service: Gastroenterology;  Laterality: N/A;   CYSTOSCOPY N/A 04/23/2021   Procedure: CYSTOSCOPY;  Surgeon: Paula Compton, MD;  [facility];  Service: Gynecology;  Laterality: N/A;   ESOPHAGOGASTRODUODENOSCOPY (EGD) WITH PROPOFOL N/A 07/25/2021   Procedure: ESOPHAGOGASTRODUODENOSCOPY (EGD) WITH PROPOFOL;  Surgeon: [name], MD;  Location: [facility];  Service: Gastroenterology;  Laterality:  N/A;   LAPAROSCOPIC VAGINAL HYSTERECTOMY WITH SALPINGECTOMY Bilateral 04/23/2021   Procedure: LAPAROSCOPIC ASSISTED VAGINAL HYSTERECTOMY WITH BILATERAL  SALPINGECTOMY;  Surgeon: Paula Compton, MD;  Location: [place];  Service: Gynecology;  Laterality: Bilateral;   TONSILLECTOMY Bilateral 2020   TRANSFORAMINAL LUMBAR INTERBODY FUSION (TLIF) WITH PEDICLE SCREW FIXATION 1 LEVEL N/A 05/07/2019   Procedure: Lumbar Five- Sacral One Transforaminal lumbar interbody Fusion;  Surgeon: Erline Levine, MD;  Location: [place];  Service: Neurosurgery;  Laterality: N/A;  Lumbar 5 Sacral 1 Transforaminal lumbar interbody fusion   Patient Active Problem List   Diagnosis Date Noted   Iron deficiency anemia 10/17/2021   Lumbar post-laminectomy syndrome 10/17/2021   Lumbar spondylosis 10/17/2021   S/P laparoscopic assisted vaginal hysterectomy (LAVH) 04/23/2021   Dyspareunia in female 04/11/2021   Dysmenorrhea 04/11/2021   Exocrine pancreatic insufficiency 03/23/2021   Panic attacks 03/20/2020   Status post lumbar spinal fusion 03/20/2020   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   Lumbar foraminal stenosis 05/07/2019   GAD (generalized anxiety disorder) 03/10/2019   OCD (obsessive compulsive disorder) 03/10/2019   Major depression, recurrent, chronic ([place]) 02/08/2019    REFERRING DIAG: M54.2 (ICD-10-CM) - Cervicalgia   THERAPY DIAG:  Cervicalgia  Muscle weakness  Rationale for Evaluation and Treatment Rehabilitation  PERTINENT HISTORY: Lumbar fusion, post-laminectomy syndrome     PRECAUTIONS: None  SUBJECTIVE:  SUBJECTIVE STATEMENT:  Pt presents to PT with reports of headache symptoms and neck pain. Has been compliant with HEP with no adverse effect. Pt is ready to begin PT at this time.    PAIN:   Are you having pain? Yes Pain location: neck and headache NPRS scale:  1/10 to 7/10 Aggravating factors: light Relieving factors: no relieving factors Pain description:  "really bad tension headache" Stage: Subacute Stability: staying the same 24 hour pattern: better in the morning, early afternoon it gets worse    OBJECTIVE: (objective measures completed at initial evaluation unless otherwise dated)  DIAGNOSTIC FINDINGS:  MRI Cx   IMPRESSION: 1. Moderate left foraminal stenosis at C3-4 secondary to asymmetric left-sided uncovertebral and facet hypertrophy. 2. Mild left foraminal narrowing at C4-5 secondary to uncovertebral and facet hypertrophy. 3. Mild uncovertebral spurring bilaterally at C5-6 and C6-7 without significant stenosis. 4. No focal osseous lesions are present. The spinous process of C7 is likely palpable. 5. 2.4 x 2.4 cm nodule in the left lobe of the thyroid, previously biopsied at [facility] This has been evaluated on previous imaging. (ref: J Am Coll Radiol. 2015 Feb;12(2): 143-50).   GENERAL OBSERVATION:          Forward head, rounded shoulders, increased kyphosis in sitting                               SENSATION:          Light touch: Appears intact on exam, subjective report of N/T C6 C7           PALPATION: TTP with * pain sub occipitals, palpable nodule mid Cx spine   Cervical ROM   ROM ROM  07/05/2022  Flexion 60  Extension 50  Right lateral flexion 45  Left lateral flexion 30  Right rotation 60*  Left rotation 40*  Flexion rotation (normal is 30 degrees) N  Flexion rotation (normal is 30 degrees) N    (Blank rows = not tested, N = WNL, * = concordant pain)   UPPER EXTREMITY MMT:   MMT Right 07/05/2022 Left 07/05/2022  Shoulder flexion      Shoulder abduction (C5)      Shoulder ER      Shoulder IR      Middle trapezius 3+ 3+  Lower trapezius 3 3  Shoulder extension      Grip strength 40 60  Cervical flexion (C1,C2)       Cervical S/B (C3)      Shoulder shrug (C4) c c  Elbow flexion (C6) c c  Elbow ext (C7) c c  Thumb ext (C8) c c  Finger abd (T1) c c  Grossly        (Blank rows = not tested, score listed is out of 5 possible points.  N = WNL, D = diminished, C = clear for gross weakness with myotome testing, * = concordant pain with testing)     JOINT MOBILITY TESTING:  WNL   PATIENT SURVEYS:  FOTO 61 -> 72              TODAY'S TREATMENT:  Therapeutic Exercise: UBE lvl 2.0 x 3 min while taking subjective  Standing alternating row 3x10 13# ea Seated high row 2x10 25# Lat pulldown 2x10 25# Prone Y/T/W 2x12 each Prone W with ext 2x10  Chin tuck - ball on wall - with scaption - 4# 2x10 Overhead press x 10 4#  DB Manual Therapy: Skilled palpation to identify trigger points for TDN STM to all listed muscles following TDN  Suboccipital release Trigger Point Dry-Needling:  Treatment instructions: Expect mild to moderate muscle soreness. S/S of pneumothorax if dry needled over a lung field, and to seek immediate medical attention should they occur. Patient verbalized understanding of these instructions and education.   Patient Consent Given: Yes Education handout provided: No Muscles treated: cervical paraspinals, bil sub occipitals, bil UT Electrical stimulation performed: NO Parameters: N/a Treatment response/outcome: twitch, pain relief                 PATIENT EDUCATION:  POC, diagnosis, prognosis, HEP, and outcome measures.  Pt educated via explanation, demonstration, and handout (HEP).  Pt confirms understanding verbally.             HOME EXERCISE PROGRAM: Access Code: [email or web address] URL: [email or web address] Date: 07/15/2022 Prepared by: Octavio Manns  Exercises - Seated Passive Cervical Retraction  - 1 x daily - 7 x weekly - 3 sets - 10 reps - Shoulder External Rotation and Scapular Retraction with Resistance  - 1 x daily - 7 x weekly - 2 sets - 10 reps - red theraband  hold - Standing Shoulder Horizontal Abduction with Resistance  - 1 x daily - 7 x weekly - 2 sets - 10 reps - red theraband hold - Standing Shoulder Row with Anchored Resistance  - 1 x daily - 7 x weekly - 3 sets - 10 reps - green theraband hold - Prone Scapular Retraction Y  - 1 x daily - 7 x weekly - 2-3 sets - 10 reps - Prone T  - 1 x daily - 7 x weekly - 2-3 sets - 10 reps   ASTERISK SIGNS     Asterisk Signs Eval (07/05/2022)            Headaches Constant up to 7/10            Mid trap 3+ bil            Low trap 3 bil            L rotation 40 degrees                               ASSESSMENT:   CLINICAL IMPRESSION: Pt able to complete all precribed exercises no adverse effect. Responded well to TPDN and manual therapy/ Exercises focused on improving periscapular and DNF strength in order to decrease pain. Will continue per POC.      OBJECTIVE IMPAIRMENTS: Pain, cervical ROM   ACTIVITY LIMITATIONS: Reading, head turns, ADLs   PERSONAL FACTORS: See medical history and pertinent history     GOALS:     SHORT TERM GOALS: Target date: 08/02/2022   Melisia will be >75% HEP compliant to improve carryover between sessions and facilitate independent management of condition   Evaluation (07/05/2022): ongoing Goal status: INITIAL     LONG TERM GOALS: Target date: 08/30/2022   Psalm will improve FOTO score to 72 as a proxy for functional improvement   Evaluation/Baseline (07/05/2022): 61 Goal status: INITIAL     2.  Timaya will self report >/= 50% decrease in pain from evaluation    Evaluation/Baseline (07/05/2022): 7/10 max pain Goal status: INITIAL     3.  Kinleigh will improve the following MMTs to >/= 4/5 to show improvement in strength:  mid and lower traps    Evaluation/Baseline (07/05/2022):  see chart in note Goal status: INITIAL     4.  Brigid will improve cervical L rotation to >/= 50 degrees   Evaluation/Baseline (07/05/2022): 40 degrees Goal status: INITIAL     5.   Margean will improve HA frequency by 75%   Evaluation/Baseline (07/05/2022): constant Goal status: INITIAL     PLAN: PT FREQUENCY: 1-2x/week   PT DURATION: 8 weeks (Ending 08/30/2022)   PLANNED INTERVENTIONS: Therapeutic exercises, Aquatic therapy, Therapeutic activity, Neuro Muscular re-education, Gait training, Patient/Family education, Joint mobilization, Dry Needling, Electrical stimulation, Spinal mobilization and/or manipulation, Moist heat, Taping, Vasopneumatic device, Ionotophoresis '4mg'$ /ml Dexamethasone, and Manual therapy   PLAN FOR NEXT SESSION: cervical and periscapular strengthening, TDN   Ward Chatters, PT 07/29/2022, 9:08 AM

## 2022-08-01 ENCOUNTER — Ambulatory Visit: Payer: 59 | Attending: Neurological Surgery

## 2022-08-01 DIAGNOSIS — M542 Cervicalgia: Secondary | ICD-10-CM | POA: Insufficient documentation

## 2022-08-01 DIAGNOSIS — R279 Unspecified lack of coordination: Secondary | ICD-10-CM | POA: Insufficient documentation

## 2022-08-01 DIAGNOSIS — M6281 Muscle weakness (generalized): Secondary | ICD-10-CM | POA: Insufficient documentation

## 2022-08-01 NOTE — Therapy (Signed)
OUTPATIENT PHYSICAL THERAPY TREATMENT NOTE   Patient Name: Rebecca Holt MRN: [id number] DOB:Sep 30, 1985, 37 y.o., female Today's Date: 08/01/2022  PCP: Karsten Ro, DO  REFERRING PROVIDER: Karsten Ro, DO   END OF SESSION:   PT End of Session - 08/01/22 0914     Visit Number 9    Number of Visits --   1-2x/week   Date for PT Re-Evaluation 08/30/22    Authorization Type Aet[employer]    PT Start Time 0915    PT Stop Time 0953    PT Time Calculation (min) 38 min    Activity Tolerance Patient tolerated treatment well    Behavior During Therapy [facility] for tasks assessed/performed                 Past Medical History:  Diagnosis Date   Anxiety    Arthritis    si joints   Bipolar disorder ([place])    Chicken pox    as child   Depressive disorder 02/08/2019   History of COVID-19 09/09/2020   g i issues x 2-3 weeks all symptoms reolved   History of hypothyroidism    yrs ago per pt on 04-17-2021   Pneumonia    yrs ago per pt on 04-17-2021   Wears glasses    Past Surgical History:  Procedure Laterality Date   ANTERIOR AND POSTERIOR REPAIR N/A 04/23/2021   Procedure: POSTERIOR REPAIR (RECTOCELE);  Surgeon: Paula Compton, MD;  Location: [facility];  Service: Gynecology;  Laterality: N/A;   BIOPSY  07/25/2021   Procedure: BIOPSY;  Surgeon: [name], MD;  Location: [facility];  Service: Gastroenterology;;   COLONOSCOPY WITH PROPOFOL N/A 07/25/2021   Procedure: COLONOSCOPY WITH PROPOFOL;  Surgeon: [name], MD;  Location: [facility];  Service: Gastroenterology;  Laterality: N/A;   CYSTOSCOPY N/A 04/23/2021   Procedure: CYSTOSCOPY;  Surgeon: Paula Compton, MD;  Location: [facility] [place];  Service: Gynecology;  Laterality: N/A;   ESOPHAGOGASTRODUODENOSCOPY (EGD) WITH PROPOFOL N/A 07/25/2021   Procedure: ESOPHAGOGASTRODUODENOSCOPY (EGD) WITH PROPOFOL;  Surgeon: [name], MD;  Location: [facility];  Service: Gastroenterology;  Laterality:  N/A;   LAPAROSCOPIC VAGINAL HYSTERECTOMY WITH SALPINGECTOMY Bilateral 04/23/2021   Procedure: LAPAROSCOPIC ASSISTED VAGINAL HYSTERECTOMY WITH BILATERAL  SALPINGECTOMY;  Surgeon: Paula Compton, MD;  Location: [place];  Service: Gynecology;  Laterality: Bilateral;   TONSILLECTOMY Bilateral 2020   TRANSFORAMINAL LUMBAR INTERBODY FUSION (TLIF) WITH PEDICLE SCREW FIXATION 1 LEVEL N/A 05/07/2019   Procedure: Lumbar Five- Sacral One Transforaminal lumbar interbody Fusion;  Surgeon: Erline Levine, MD;  Location: [place];  Service: Neurosurgery;  Laterality: N/A;  Lumbar 5 Sacral 1 Transforaminal lumbar interbody fusion   Patient Active Problem List   Diagnosis Date Noted   Iron deficiency anemia 10/17/2021   Lumbar post-laminectomy syndrome 10/17/2021   Lumbar spondylosis 10/17/2021   S/P laparoscopic assisted vaginal hysterectomy (LAVH) 04/23/2021   Dyspareunia in female 04/11/2021   Dysmenorrhea 04/11/2021   Exocrine pancreatic insufficiency 03/23/2021   Panic attacks 03/20/2020   Status post lumbar spinal fusion 03/20/2020   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   Lumbar foraminal stenosis 05/07/2019   GAD (generalized anxiety disorder) 03/10/2019   OCD (obsessive compulsive disorder) 03/10/2019   Major depression, recurrent, chronic ([place]) 02/08/2019    REFERRING DIAG: M54.2 (ICD-10-CM) - Cervicalgia   THERAPY DIAG:  Cervicalgia  Muscle weakness  Rationale for Evaluation and Treatment Rehabilitation  PERTINENT HISTORY: Lumbar fusion, post-laminectomy syndrome     PRECAUTIONS: None  SUBJECTIVE:                                                                                                                                                                                      SUBJECTIVE STATEMENT:  Pt presents to PT with reports of continued improvement in symptoms although she had a significant headache yesterday. Continues HEP compliance. Ready to begin PT at  this time.    PAIN:  Are you having pain? Yes Pain location: neck and headache NPRS scale:  1/10 to 7/10 Aggravating factors: light Relieving factors: no relieving factors Pain description:  "really bad tension headache" Stage: Subacute Stability: staying the same 24 hour pattern: better in the morning, early afternoon it gets worse    OBJECTIVE: (objective measures completed at initial evaluation unless otherwise dated)  DIAGNOSTIC FINDINGS:  MRI Cx   IMPRESSION: 1. Moderate left foraminal stenosis at C3-4 secondary to asymmetric left-sided uncovertebral and facet hypertrophy. 2. Mild left foraminal narrowing at C4-5 secondary to uncovertebral and facet hypertrophy. 3. Mild uncovertebral spurring bilaterally at C5-6 and C6-7 without significant stenosis. 4. No focal osseous lesions are present. The spinous process of C7 is likely palpable. 5. 2.4 x 2.4 cm nodule in the left lobe of the thyroid, previously biopsied at [facility]. This has been evaluated on previous imaging. (ref: J Am Coll Radiol. 2015 Feb;12(2): 143-50).   GENERAL OBSERVATION:          Forward head, rounded shoulders, increased kyphosis in sitting                               SENSATION:          Light touch: Appears intact on exam, subjective report of N/T C6 C7           PALPATION: TTP with * pain sub occipitals, palpable nodule mid Cx spine   Cervical ROM   ROM ROM  07/05/2022  Flexion 60  Extension 50  Right lateral flexion 45  Left lateral flexion 30  Right rotation 60*  Left rotation 40*  Flexion rotation (normal is 30 degrees) N  Flexion rotation (normal is 30 degrees) N    (Blank rows = not tested, N = WNL, * = concordant pain)   UPPER EXTREMITY MMT:   MMT Right 07/05/2022 Left 07/05/2022  Shoulder flexion      Shoulder abduction (C5)      Shoulder ER      Shoulder IR      Middle trapezius 3+ 3+  Lower trapezius 3 3  Shoulder extension      Grip strength 40  60  Cervical  flexion (C1,C2)      Cervical S/B (C3)      Shoulder shrug (C4) c c  Elbow flexion (C6) c c  Elbow ext (C7) c c  Thumb ext (C8) c c  Finger abd (T1) c c  Grossly        (Blank rows = not tested, score listed is out of 5 possible points.  N = WNL, D = diminished, C = clear for gross weakness with myotome testing, * = concordant pain with testing)     JOINT MOBILITY TESTING:  WNL   PATIENT SURVEYS:  FOTO 61 -> 72              TODAY'S TREATMENT:  Therapeutic Exercise: UBE lvl 1.5 x 3 min while taking subjective  Seated high row 3x10 30# Lat pulldown 2x10 30# Standing W at wall x 10 Standing chin tuck with ball 2x10  Prone Y/T/I 2x10 each 1# Seated bilateral ER 2x15 GTB Seated horizontal abd 2x15 GTB Manual Therapy: Skilled palpation to identify trigger points for TDN STM to all listed muscles following TDN  Suboccipital release Trigger Point Dry-Needling:  Treatment instructions: Expect mild to moderate muscle soreness. S/S of pneumothorax if dry needled over a lung field, and to seek immediate medical attention should they occur. Patient verbalized understanding of these instructions and education.   Patient Consent Given: Yes Education handout provided: No Muscles treated: cervical paraspinals, bil sub occipitals, bil UT Electrical stimulation performed: NO Parameters: N/a Treatment response/outcome: twitch, pain relief                 PATIENT EDUCATION:  POC, diagnosis, prognosis, HEP, and outcome measures.  Pt educated via explanation, demonstration, and handout (HEP).  Pt confirms understanding verbally.             HOME EXERCISE PROGRAM: Access Code: [email or web address] URL: [email or web address] Date: 07/15/2022 Prepared by: Octavio Manns  Exercises - Seated Passive Cervical Retraction  - 1 x daily - 7 x weekly - 3 sets - 10 reps - Shoulder External Rotation and Scapular Retraction with Resistance  - 1 x daily - 7 x weekly - 2 sets - 10 reps - red  theraband hold - Standing Shoulder Horizontal Abduction with Resistance  - 1 x daily - 7 x weekly - 2 sets - 10 reps - red theraband hold - Standing Shoulder Row with Anchored Resistance  - 1 x daily - 7 x weekly - 3 sets - 10 reps - green theraband hold - Prone Scapular Retraction Y  - 1 x daily - 7 x weekly - 2-3 sets - 10 reps - Prone T  - 1 x daily - 7 x weekly - 2-3 sets - 10 reps   ASTERISK SIGNS     Asterisk Signs Eval (07/05/2022)            Headaches Constant up to 7/10            Mid trap 3+ bil            Low trap 3 bil            L rotation 40 degrees                               ASSESSMENT:   CLINICAL IMPRESSION: Pt able to complete all precribed exercises no adverse effect. Responded well to TPDN and manual therapy/ Exercises focused on improving  periscapular and DNF strength in order to decrease pain. Will continue per POC.       OBJECTIVE IMPAIRMENTS: Pain, cervical ROM   ACTIVITY LIMITATIONS: Reading, head turns, ADLs   PERSONAL FACTORS: See medical history and pertinent history     GOALS:     SHORT TERM GOALS: Target date: 08/02/2022   Rebecca Holt will be >75% HEP compliant to improve carryover between sessions and facilitate independent management of condition   Evaluation (07/05/2022): ongoing Goal status: INITIAL     LONG TERM GOALS: Target date: 08/30/2022   Rebecca Holt will improve FOTO score to 72 as a proxy for functional improvement   Evaluation/Baseline (07/05/2022): 61 Goal status: INITIAL     2.  Rebecca Holt will self report >/= 50% decrease in pain from evaluation    Evaluation/Baseline (07/05/2022): 7/10 max pain Goal status: INITIAL     3.  Rebecca Holt will improve the following MMTs to >/= 4/5 to show improvement in strength:  mid and lower traps    Evaluation/Baseline (07/05/2022): see chart in note Goal status: INITIAL     4.  Rebecca Holt will improve cervical L rotation to >/= 50 degrees   Evaluation/Baseline (07/05/2022): 40 degrees Goal status: INITIAL      5.  Rebecca Holt will improve HA frequency by 75%   Evaluation/Baseline (07/05/2022): constant Goal status: INITIAL     PLAN: PT FREQUENCY: 1-2x/week   PT DURATION: 8 weeks (Ending 08/30/2022)   PLANNED INTERVENTIONS: Therapeutic exercises, Aquatic therapy, Therapeutic activity, Neuro Muscular re-education, Gait training, Patient/Family education, Joint mobilization, Dry Needling, Electrical stimulation, Spinal mobilization and/or manipulation, Moist heat, Taping, Vasopneumatic device, Ionotophoresis '4mg'$ /ml Dexamethasone, and Manual therapy   PLAN FOR NEXT SESSION: cervical and periscapular strengthening, TDN   Ward Chatters, PT 08/01/2022, 9:55 AM

## 2022-08-05 NOTE — Therapy (Signed)
OUTPATIENT PHYSICAL THERAPY TREATMENT NOTE   Patient Name: Rebecca Holt MRN: [id number] DOB:30-Sep-1985, 37 y.o., female Today's Date: 08/06/2022  PCP: Karsten Ro, DO  REFERRING PROVIDER: Karsten Ro, DO   END OF SESSION:   PT End of Session - 08/06/22 0907     Visit Number 10    Number of Visits --   1-2x/week   Date for PT Re-Evaluation 08/30/22    Authorization Type Aetna    PT Start Time 0915    PT Stop Time 0954    PT Time Calculation (min) 39 min    Activity Tolerance Patient tolerated treatment well    Behavior During Therapy [facility] for tasks assessed/performed                  Past Medical History:  Diagnosis Date   Anxiety    Arthritis    si joints   Bipolar disorder ([place])    Chicken pox    as child   Depressive disorder 02/08/2019   History of COVID-19 09/09/2020   g i issues x 2-3 weeks all symptoms reolved   History of hypothyroidism    yrs ago per pt on 04-17-2021   Pneumonia    yrs ago per pt on 04-17-2021   Wears glasses    Past Surgical History:  Procedure Laterality Date   ANTERIOR AND POSTERIOR REPAIR N/A 04/23/2021   Procedure: POSTERIOR REPAIR (RECTOCELE);  Surgeon: Paula Compton, MD;  Location: [facility];  Service: Gynecology;  Laterality: N/A;   BIOPSY  07/25/2021   Procedure: BIOPSY;  Surgeon: [name], MD;  Location: [facility];  Service: Gastroenterology;;   COLONOSCOPY WITH PROPOFOL N/A 07/25/2021   Procedure: COLONOSCOPY WITH PROPOFOL;  Surgeon: [name], MD;  Location: [facility];  Service: Gastroenterology;  Laterality: N/A;   CYSTOSCOPY N/A 04/23/2021   Procedure: CYSTOSCOPY;  Surgeon: Paula Compton, MD;  Location: [facility];  Service: Gynecology;  Laterality: N/A;   ESOPHAGOGASTRODUODENOSCOPY (EGD) WITH PROPOFOL N/A 07/25/2021   Procedure: ESOPHAGOGASTRODUODENOSCOPY (EGD) WITH PROPOFOL;  Surgeon: [name], MD;  Location[facility];  Service: Gastroenterology;   Laterality: N/A;   LAPAROSCOPIC VAGINAL HYSTERECTOMY WITH SALPINGECTOMY Bilateral 04/23/2021   Procedure: LAPAROSCOPIC ASSISTED VAGINAL HYSTERECTOMY WITH BILATERAL  SALPINGECTOMY;  Surgeon: Paula Compton, MD;  Location: [place];  Service: Gynecology;  Laterality: Bilateral;   TONSILLECTOMY Bilateral 2020   TRANSFORAMINAL LUMBAR INTERBODY FUSION (TLIF) WITH PEDICLE SCREW FIXATION 1 LEVEL N/A 05/07/2019   Procedure: Lumbar Five- Sacral One Transforaminal lumbar interbody Fusion;  Surgeon: Erline Levine, MD;  Location: [place];  Service: Neurosurgery;  Laterality: N/A;  Lumbar 5 Sacral 1 Transforaminal lumbar interbody fusion   Patient Active Problem List   Diagnosis Date Noted   Iron deficiency anemia 10/17/2021   Lumbar post-laminectomy syndrome 10/17/2021   Lumbar spondylosis 10/17/2021   S/P laparoscopic assisted vaginal hysterectomy (LAVH) 04/23/2021   Dyspareunia in female 04/11/2021   Dysmenorrhea 04/11/2021   Exocrine pancreatic insufficiency 03/23/2021   Panic attacks 03/20/2020   Status post lumbar spinal fusion 03/20/2020   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   Lumbar foraminal stenosis 05/07/2019   GAD (generalized anxiety disorder) 03/10/2019   OCD (obsessive compulsive disorder) 03/10/2019   Major depression, recurrent, chronic ([place]) 02/08/2019    REFERRING DIAG: M54.2 (ICD-10-CM) - Cervicalgia   THERAPY DIAG:  Cervicalgia  Muscle weakness  Rationale for Evaluation and Treatment Rehabilitation  PERTINENT HISTORY: Lumbar fusion, post-laminectomy syndrome     PRECAUTIONS: None  SUBJECTIVE:                                                                                                                                                                                      SUBJECTIVE STATEMENT:  Pt presents to PT with reports of improving symptoms. Notes that she did not have a headache this past weekend for first time in months.     PAIN:  Are  you having pain? Yes Pain location: neck and headache NPRS scale:  1/10 to 7/10 Aggravating factors: light Relieving factors: no relieving factors Pain description:  "really bad tension headache" Stage: Subacute Stability: staying the same 24 hour pattern: better in the morning, early afternoon it gets worse    OBJECTIVE: (objective measures completed at initial evaluation unless otherwise dated)  DIAGNOSTIC FINDINGS:  MRI Cx   IMPRESSION: 1. Moderate left foraminal stenosis at C3-4 secondary to asymmetric left-sided uncovertebral and facet hypertrophy. 2. Mild left foraminal narrowing at C4-5 secondary to uncovertebral and facet hypertrophy. 3. Mild uncovertebral spurring bilaterally at C5-6 and C6-7 without significant stenosis. 4. No focal osseous lesions are present. The spinous process of C7 is likely palpable. 5. 2.4 x 2.4 cm nodule in the left lobe of the thyroid, previously biopsied at [facility]. This has been evaluated on previous imaging. (ref: J Am Coll Radiol. 2015 Feb;12(2): 143-50).   GENERAL OBSERVATION:          Forward head, rounded shoulders, increased kyphosis in sitting                               SENSATION:          Light touch: Appears intact on exam, subjective report of N/T C6 C7           PALPATION: TTP with * pain sub occipitals, palpable nodule mid Cx spine   Cervical ROM   ROM ROM  07/05/2022  Flexion 60  Extension 50  Right lateral flexion 45  Left lateral flexion 30  Right rotation 60*  Left rotation 40*  Flexion rotation (normal is 30 degrees) N  Flexion rotation (normal is 30 degrees) N    (Blank rows = not tested, N = WNL, * = concordant pain)   UPPER EXTREMITY MMT:   MMT Right 07/05/2022 Left 07/05/2022  Shoulder flexion      Shoulder abduction (C5)      Shoulder ER      Shoulder IR      Middle trapezius 3+ 3+  Lower trapezius 3 3  Shoulder extension      Grip strength 40 60  Cervical flexion (C1,C2)      Cervical S/B  (C3)      Shoulder shrug (C4) c c  Elbow flexion (C6) c c  Elbow ext (C7) c c  Thumb ext (C8) c c  Finger abd (T1) c c  Grossly        (Blank rows = not tested, score listed is out of 5 possible points.  N = WNL, D = diminished, C = clear for gross weakness with myotome testing, * = concordant pain with testing)     JOINT MOBILITY TESTING:  WNL   PATIENT SURVEYS:  FOTO 61 -> 72              TODAY'S TREATMENT:  Therapeutic Exercise: UBE lvl 2.0 x 4 min while taking subjective  Seated high row 3x10 30# Lat pulldown 2x10 30# Supine horizontal abd 2x15 BTB Standing chin tuck with ball 2x10  Prone Y/T/I 2x10 each 1# Manual Therapy: Skilled palpation to identify trigger points for TDN STM to all listed muscles following TDN  Suboccipital release Positional release to bilateral UT Trigger Point Dry-Needling:  Treatment instructions: Expect mild to moderate muscle soreness. S/S of pneumothorax if dry needled over a lung field, and to seek immediate medical attention should they occur. Patient verbalized understanding of these instructions and education.   Patient Consent Given: Yes Education handout provided: No Muscles treated: cervical paraspinals, bil sub occipitals, bil UT Electrical stimulation performed: NO Parameters: N/a Treatment response/outcome: twitch, pain relief                 PATIENT EDUCATION:  POC, diagnosis, prognosis, HEP, and outcome measures.  Pt educated via explanation, demonstration, and handout (HEP).  Pt confirms understanding verbally.             HOME EXERCISE PROGRAM: Access Code: [email or web address] URL: [email or web address] Date: 07/15/2022 Prepared by: Octavio Manns  Exercises - Seated Passive Cervical Retraction  - 1 x daily - 7 x weekly - 3 sets - 10 reps - Shoulder External Rotation and Scapular Retraction with Resistance  - 1 x daily - 7 x weekly - 2 sets - 10 reps - red theraband hold - Standing Shoulder Horizontal Abduction with  Resistance  - 1 x daily - 7 x weekly - 2 sets - 10 reps - red theraband hold - Standing Shoulder Row with Anchored Resistance  - 1 x daily - 7 x weekly - 3 sets - 10 reps - green theraband hold - Prone Scapular Retraction Y  - 1 x daily - 7 x weekly - 2-3 sets - 10 reps - Prone T  - 1 x daily - 7 x weekly - 2-3 sets - 10 reps   ASTERISK SIGNS     Asterisk Signs Eval (07/05/2022)            Headaches Constant up to 7/10            Mid trap 3+ bil            Low trap 3 bil            L rotation 40 degrees                               ASSESSMENT:   CLINICAL IMPRESSION: Pt able to complete all precribed exercises no adverse effect. Responded well to TPDN and manual therapy/ Exercises focused on improving periscapular and DNF strength in order to decrease  pain. Will continue per POC.        OBJECTIVE IMPAIRMENTS: Pain, cervical ROM   ACTIVITY LIMITATIONS: Reading, head turns, ADLs   PERSONAL FACTORS: See medical history and pertinent history     GOALS:     SHORT TERM GOALS: Target date: 08/02/2022   Kayse will be >75% HEP compliant to improve carryover between sessions and facilitate independent management of condition   Evaluation (07/05/2022): ongoing Goal status: INITIAL     LONG TERM GOALS: Target date: 08/30/2022   Kyree will improve FOTO score to 72 as a proxy for functional improvement   Evaluation/Baseline (07/05/2022): 61 Goal status: INITIAL     2.  Airiana will self report >/= 50% decrease in pain from evaluation    Evaluation/Baseline (07/05/2022): 7/10 max pain Goal status: INITIAL     3.  Maesyn will improve the following MMTs to >/= 4/5 to show improvement in strength:  mid and lower traps    Evaluation/Baseline (07/05/2022): see chart in note Goal status: INITIAL     4.  Mitzy will improve cervical L rotation to >/= 50 degrees   Evaluation/Baseline (07/05/2022): 40 degrees Goal status: INITIAL     5.  Iniya will improve HA frequency by 75%    Evaluation/Baseline (07/05/2022): constant Goal status: INITIAL     PLAN: PT FREQUENCY: 1-2x/week   PT DURATION: 8 weeks (Ending 08/30/2022)   PLANNED INTERVENTIONS: Therapeutic exercises, Aquatic therapy, Therapeutic activity, Neuro Muscular re-education, Gait training, Patient/Family education, Joint mobilization, Dry Needling, Electrical stimulation, Spinal mobilization and/or manipulation, Moist heat, Taping, Vasopneumatic device, Ionotophoresis '4mg'$ /ml Dexamethasone, and Manual therapy   PLAN FOR NEXT SESSION: cervical and periscapular strengthening, TDN   Ward Chatters, PT 08/06/2022, 9:55 AM

## 2022-08-06 ENCOUNTER — Ambulatory Visit: Payer: 59

## 2022-08-06 DIAGNOSIS — M6281 Muscle weakness (generalized): Secondary | ICD-10-CM

## 2022-08-06 DIAGNOSIS — M542 Cervicalgia: Secondary | ICD-10-CM | POA: Diagnosis not present

## 2022-08-08 ENCOUNTER — Ambulatory Visit: Payer: 59 | Admitting: Physical Therapy

## 2022-08-08 ENCOUNTER — Encounter: Payer: Self-pay | Admitting: Physical Therapy

## 2022-08-08 DIAGNOSIS — M6281 Muscle weakness (generalized): Secondary | ICD-10-CM

## 2022-08-08 DIAGNOSIS — M542 Cervicalgia: Secondary | ICD-10-CM

## 2022-08-08 DIAGNOSIS — R279 Unspecified lack of coordination: Secondary | ICD-10-CM

## 2022-08-08 NOTE — Therapy (Signed)
OUTPATIENT PHYSICAL THERAPY TREATMENT NOTE   Patient Name: Denina Rieger MRN: [id number] DOB:October 17, 1985, 37 y.o., female Today's Date: 08/08/2022  PCP: Karsten Ro, DO  REFERRING PROVIDER: Karsten Ro, DO   END OF SESSION:   PT End of Session - 08/08/22 0914     Visit Number 11    Number of Visits --   1-2x/week   Date for PT Re-Evaluation 08/30/22    Authorization Type [employer]et[employer]    PT Start Time 0915    PT Stop Time 0956    PT Time Calculation (min) 41 min    Activity Tolerance Patient tolerated treatment well    Behavior During Therapy [facility] for tasks assessed/performed                  Past Medical History:  Diagnosis Date   Anxiety    Arthritis    si joints   Bipolar disorder ([place])    Chicken pox    as child   Depressive disorder 02/08/2019   History of COVID-19 09/09/2020   g i issues x 2-3 weeks all symptoms reolved   History of hypothyroidism    yrs ago per pt on 04-17-2021   Pneumonia    yrs ago per pt on 04-17-2021   Wears glasses    Past Surgical History:  Procedure Laterality Date   ANTERIOR AND POSTERIOR REPAIR N/A 04/23/2021   Procedure: POSTERIOR REPAIR (RECTOCELE);  Surgeon: Paula Compton, MD;  Location: [facility];  Service: Gynecology;  Laterality: N/A;   BIOPSY  07/25/2021   Procedure: BIOPSY;  Surgeon: [name], MD;  Location: [facility];  Service: Gastroenterology;;   COLONOSCOPY WITH PROPOFOL N/A 07/25/2021   Procedure: COLONOSCOPY WITH PROPOFOL;  Surgeon: [name], MD;  Location: [facility];  Service: Gastroenterology;  Laterality: N/A;   CYSTOSCOPY N/A 04/23/2021   Procedure: CYSTOSCOPY;  Surgeon: Paula Compton, MD;  Location: [facility];  Service: Gynecology;  Laterality: N/A;   ESOPHAGOGASTRODUODENOSCOPY (EGD) WITH PROPOFOL N/A 07/25/2021   Procedure: ESOPHAGOGASTRODUODENOSCOPY (EGD) WITH PROPOFOL;  Surgeon: [name], MD;  Location: [facility];  Service: Gastroenterology;   Laterality: N/A;   LAPAROSCOPIC VAGINAL HYSTERECTOMY WITH SALPINGECTOMY Bilateral 04/23/2021   Procedure: LAPAROSCOPIC ASSISTED VAGINAL HYSTERECTOMY WITH BILATERAL  SALPINGECTOMY;  Surgeon: Paula Compton, MD;  Location: [place];  Service: Gynecology;  Laterality: Bilateral;   TONSILLECTOMY Bilateral 2020   TRANSFORAMINAL LUMBAR INTERBODY FUSION (TLIF) WITH PEDICLE SCREW FIXATION 1 LEVEL N/A 05/07/2019   Procedure: Lumbar Five- Sacral One Transforaminal lumbar interbody Fusion;  Surgeon: Erline Levine, MD;  Location: [place];  Service: Neurosurgery;  Laterality: N/A;  Lumbar 5 Sacral 1 Transforaminal lumbar interbody fusion   Patient Active Problem List   Diagnosis Date Noted   Iron deficiency anemia 10/17/2021   Lumbar post-laminectomy syndrome 10/17/2021   Lumbar spondylosis 10/17/2021   S/P laparoscopic assisted vaginal hysterectomy (LAVH) 04/23/2021   Dyspareunia in female 04/11/2021   Dysmenorrhea 04/11/2021   Exocrine pancreatic insufficiency 03/23/2021   Panic attacks 03/20/2020   Status post lumbar spinal fusion 03/20/2020   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   Lumbar foraminal stenosis 05/07/2019   GAD (generalized anxiety disorder) 03/10/2019   OCD (obsessive compulsive disorder) 03/10/2019   Major depression, recurrent, chronic ([place]) 02/08/2019    REFERRING DIAG: M54.2 (ICD-10-CM) - Cervicalgia   THERAPY DIAG:  Cervicalgia  Muscle weakness  Muscle weakness (generalized)  Unspecified lack of coordination  Rationale for Evaluation and Treatment Rehabilitation  PERTINENT HISTORY: Lumbar  fusion, post-laminectomy syndrome     PRECAUTIONS: None  SUBJECTIVE:                                                                                                                                                                                      SUBJECTIVE STATEMENT:  Pt reports continued improvement from TDN and exercise.   PAIN:  Are you having  pain? Yes Pain location: neck and headache NPRS scale:  0/10 to 7/10 Aggravating factors: light Relieving factors: no relieving factors Pain description:  "really bad tension headache" Stage: Subacute Stability: staying the same 24 hour pattern: better in the morning, early afternoon it gets worse    OBJECTIVE: (objective measures completed at initial evaluation unless otherwise dated)  DIAGNOSTIC FINDINGS:  MRI Cx   IMPRESSION: 1. Moderate left foraminal stenosis at C3-4 secondary to asymmetric left-sided uncovertebral and facet hypertrophy. 2. Mild left foraminal narrowing at C4-5 secondary to uncovertebral and facet hypertrophy. 3. Mild uncovertebral spurring bilaterally at C5-6 and C6-7 without significant stenosis. 4. No focal osseous lesions are present. The spinous process of C7 is likely palpable. 5. 2.4 x 2.4 cm nodule in the left lobe of the thyroid, previously biopsied at [facility]. This has been evaluated on previous imaging. (ref: J Am Coll Radiol. 2015 Feb;12(2): 143-50).   GENERAL OBSERVATION:          Forward head, rounded shoulders, increased kyphosis in sitting                               SENSATION:          Light touch: Appears intact on exam, subjective report of N/T C6 C7           PALPATION: TTP with * pain sub occipitals, palpable nodule mid Cx spine   Cervical ROM   ROM ROM  07/05/2022  Flexion 60  Extension 50  Right lateral flexion 45  Left lateral flexion 30  Right rotation 60*  Left rotation 40*  Flexion rotation (normal is 30 degrees) N  Flexion rotation (normal is 30 degrees) N    (Blank rows = not tested, N = WNL, * = concordant pain)   UPPER EXTREMITY MMT:   MMT Right 07/05/2022 Left 07/05/2022  Shoulder flexion      Shoulder abduction (C5)      Shoulder ER      Shoulder IR      Middle trapezius 3+ 3+  Lower trapezius 3 3  Shoulder extension      Grip strength 40 60  Cervical flexion (C1,C2)      Cervical  S/B (C3)       Shoulder shrug (C4) c c  Elbow flexion (C6) c c  Elbow ext (C7) c c  Thumb ext (C8) c c  Finger abd (T1) c c  Grossly        (Blank rows = not tested, score listed is out of 5 possible points.  N = WNL, D = diminished, C = clear for gross weakness with myotome testing, * = concordant pain with testing)     JOINT MOBILITY TESTING:  WNL   PATIENT SURVEYS:  FOTO 61 -> 72              TODAY'S TREATMENT:  Therapeutic Exercise: UBE lvl 2.0 x 4 min while taking subjective  Seated high row 3x10 30# Lat pulldown 2x10 30# Supine horizontal abd 2x15 BTB Standing chin tuck with ball 2x10  Prone Y/T/I 2x10 each 1# Manual Therapy: Skilled palpation to identify trigger points for TDN STM to all listed muscles following TDN  Suboccipital release Positional release to bilateral UT Trigger Point Dry-Needling:  Treatment instructions: Expect mild to moderate muscle soreness. S/S of pneumothorax if dry needled over a lung field, and to seek immediate medical attention should they occur. Patient verbalized understanding of these instructions and education.   Patient Consent Given: Yes Education handout provided: No Muscles treated: cervical paraspinals, bil sub occipitals, bil UT Electrical stimulation performed: NO Parameters: N/a Treatment response/outcome: twitch, pain relief                 PATIENT EDUCATION:  POC, diagnosis, prognosis, HEP, and outcome measures.  Pt educated via explanation, demonstration, and handout (HEP).  Pt confirms understanding verbally.             HOME EXERCISE PROGRAM: Access Code: [email or web address] URL: [email or web address] Date: 07/15/2022 Prepared by: Octavio Manns  Exercises - Seated Passive Cervical Retraction  - 1 x daily - 7 x weekly - 3 sets - 10 reps - Shoulder External Rotation and Scapular Retraction with Resistance  - 1 x daily - 7 x weekly - 2 sets - 10 reps - red theraband hold - Standing Shoulder Horizontal Abduction with  Resistance  - 1 x daily - 7 x weekly - 2 sets - 10 reps - red theraband hold - Standing Shoulder Row with Anchored Resistance  - 1 x daily - 7 x weekly - 3 sets - 10 reps - green theraband hold - Prone Scapular Retraction Y  - 1 x daily - 7 x weekly - 2-3 sets - 10 reps - Prone T  - 1 x daily - 7 x weekly - 2-3 sets - 10 reps   ASTERISK SIGNS     Asterisk Signs Eval (07/05/2022) 2/8           Headaches Constant up to 7/10 No HA in last 7 days           Mid trap 3+ bil            Low trap 3 bil            L rotation 40 degrees                               ASSESSMENT:   CLINICAL IMPRESSION: Merleen tolerated session well with no adverse reaction.  She continues to respond well to TDN + manual + exercise with no HA reported for last 7 days which she reports is her  longest HA free period since November.   OBJECTIVE IMPAIRMENTS: Pain, cervical ROM   ACTIVITY LIMITATIONS: Reading, head turns, ADLs   PERSONAL FACTORS: See medical history and pertinent history     GOALS:     SHORT TERM GOALS: Target date: 08/02/2022   Elina will be >75% HEP compliant to improve carryover between sessions and facilitate independent management of condition   Evaluation (07/05/2022): ongoing Goal status: INITIAL     LONG TERM GOALS: Target date: 08/30/2022   Sussan will improve FOTO score to 72 as a proxy for functional improvement   Evaluation/Baseline (07/05/2022): 61 Goal status: INITIAL     2.  Xianna will self report >/= 50% decrease in pain from evaluation    Evaluation/Baseline (07/05/2022): 7/10 max pain Goal status: INITIAL     3.  Chaya will improve the following MMTs to >/= 4/5 to show improvement in strength:  mid and lower traps    Evaluation/Baseline (07/05/2022): see chart in note Goal status: INITIAL     4.  Galadriel will improve cervical L rotation to >/= 50 degrees   Evaluation/Baseline (07/05/2022): 40 degrees Goal status: INITIAL     5.  Kailea will improve HA frequency by  75%   Evaluation/Baseline (07/05/2022): constant Goal status: INITIAL     PLAN: PT FREQUENCY: 1-2x/week   PT DURATION: 8 weeks (Ending 08/30/2022)   PLANNED INTERVENTIONS: Therapeutic exercises, Aquatic therapy, Therapeutic activity, Neuro Muscular re-education, Gait training, Patient/Family education, Joint mobilization, Dry Needling, Electrical stimulation, Spinal mobilization and/or manipulation, Moist heat, Taping, Vasopneumatic device, Ionotophoresis '4mg'$ /ml Dexamethasone, and Manual therapy   PLAN FOR NEXT SESSION: cervical and periscapular strengthening, TDN   Kevan Ny Mohid Furuya, PT 08/08/2022, 10:00 AM

## 2022-08-12 ENCOUNTER — Encounter: Payer: Self-pay | Admitting: Physical Therapy

## 2022-08-12 ENCOUNTER — Ambulatory Visit: Payer: 59 | Admitting: Physical Therapy

## 2022-08-12 DIAGNOSIS — M542 Cervicalgia: Secondary | ICD-10-CM | POA: Diagnosis not present

## 2022-08-12 DIAGNOSIS — R279 Unspecified lack of coordination: Secondary | ICD-10-CM

## 2022-08-12 DIAGNOSIS — M6281 Muscle weakness (generalized): Secondary | ICD-10-CM

## 2022-08-12 NOTE — Therapy (Signed)
OUTPATIENT PHYSICAL THERAPY TREATMENT NOTE   Patient Name: Rebecca Holt MRN: [id number] DOB:[birth year], 37 y.o., female Today's Date: 08/12/2022  PCP: Karsten Ro, DO  REFERRING PROVIDER: Dawley, Theodoro Doing, DO   END OF SESSION:   PT End of Session - 08/12/22 0842     Visit Number 12    Number of Visits --   1-2x/week   Date for PT Re-Evaluation 08/30/22    Authorization Type Aetna    PT Start Time 0845    PT Stop Time 0930    PT Time Calculation (min) 45 min    Activity Tolerance Patient tolerated treatment well    Behavior During Therapy [facility] for tasks assessed/performed              Past Medical History:  Diagnosis Date   Anxiety    Arthritis    si joints   Bipolar disorder ([place])    Chicken pox    as child   Depressive disorder 02/08/2019   History of COVID-19 09/09/2020   g i issues x 2-3 weeks all symptoms reolved   History of hypothyroidism    yrs ago per pt on 04-17-2021   Pneumonia    yrs ago per pt on 04-17-2021   Wears glasses    Past Surgical History:  Procedure Laterality Date   ANTERIOR AND POSTERIOR REPAIR N/A 04/23/2021   Procedure: POSTERIOR REPAIR (RECTOCELE);  Surgeon: Paula Compton, MD;  Location: [facility];  Service: Gynecology;  Laterality: N/A;   BIOPSY  07/25/2021   Procedure: BIOPSY;  Surgeon: [name], MD;  [facility];  Service: Gastroenterology;;   COLONOSCOPY WITH PROPOFOL N/A 07/25/2021   Procedure: COLONOSCOPY WITH PROPOFOL;  Surgeon: [name], MD;  Location[facility];  Service: Gastroenterology;  Laterality: N/A;   CYSTOSCOPY N/A 04/23/2021   Procedure: CYSTOSCOPY;  Surgeon: Paula Compton, MD;  Location: [facility];  Service: Gynecology;  Laterality: N/A;   ESOPHAGOGASTRODUODENOSCOPY (EGD) WITH PROPOFOL N/A 07/25/2021   Procedure: ESOPHAGOGASTRODUODENOSCOPY (EGD) WITH PROPOFOL;  Surgeon: [name], MD;  [facility];  Service: Gastroenterology;  Laterality: N/A;    LAPAROSCOPIC VAGINAL HYSTERECTOMY WITH SALPINGECTOMY Bilateral 04/23/2021   Procedure: LAPAROSCOPIC ASSISTED VAGINAL HYSTERECTOMY WITH BILATERAL  SALPINGECTOMY;  Surgeon: Paula Compton, MD;  Location: [place];  Service: Gynecology;  Laterality: Bilateral;   TONSILLECTOMY Bilateral 2020   TRANSFORAMINAL LUMBAR INTERBODY FUSION (TLIF) WITH PEDICLE SCREW FIXATION 1 LEVEL N/A 05/07/2019   Procedure: Lumbar Five- Sacral One Transforaminal lumbar interbody Fusion;  Surgeon: Erline Levine, MD;  Location: [place];  Service: Neurosurgery;  Laterality: N/A;  Lumbar 5 Sacral 1 Transforaminal lumbar interbody fusion   Patient Active Problem List   Diagnosis Date Noted   Iron deficiency anemia 10/17/2021   Lumbar post-laminectomy syndrome 10/17/2021   Lumbar spondylosis 10/17/2021   S/P laparoscopic assisted vaginal hysterectomy (LAVH) 04/23/2021   Dyspareunia in female 04/11/2021   Dysmenorrhea 04/11/2021   Exocrine pancreatic insufficiency 03/23/2021   Panic attacks 03/20/2020   Status post lumbar spinal fusion 03/20/2020   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   Lumbar foraminal stenosis 05/07/2019   GAD (generalized anxiety disorder) 03/10/2019   OCD (obsessive compulsive disorder) 03/10/2019   Major depression, recurrent, chronic ([place]) 02/08/2019    REFERRING DIAG: M54.2 (ICD-10-CM) - Cervicalgia   THERAPY DIAG:  Cervicalgia  Muscle weakness  Muscle weakness (generalized)  Unspecified lack of coordination  Rationale for Evaluation and Treatment Rehabilitation  PERTINENT HISTORY: Lumbar fusion, post-laminectomy syndrome  PRECAUTIONS: None  SUBJECTIVE:                                                                                                                                                                                      SUBJECTIVE STATEMENT:  Pt states things have continued to go well with TDN and exercises. Pt notes mild headache last night  coming into this morning.   PAIN:  Are you having pain? Yes Pain location: neck and headache NPRS scale:  0/10 to 7/10 Aggravating factors: light Relieving factors: no relieving factors Pain description:  "really bad tension headache" Stage: Subacute Stability: staying the same 24 hour pattern: better in the morning, early afternoon it gets worse    OBJECTIVE: (objective measures completed at initial evaluation unless otherwise dated)  DIAGNOSTIC FINDINGS:  MRI Cx   IMPRESSION: 1. Moderate left foraminal stenosis at C3-4 secondary to asymmetric left-sided uncovertebral and facet hypertrophy. 2. Mild left foraminal narrowing at C4-5 secondary to uncovertebral and facet hypertrophy. 3. Mild uncovertebral spurring bilaterally at C5-6 and C6-7 without significant stenosis. 4. No focal osseous lesions are present. The spinous process of C7 is likely palpable. 5. 2.4 x 2.4 cm nodule in the left lobe of the thyroid, previously biopsied at [facility]. This has been evaluated on previous imaging. (ref: J Am Coll Radiol. 2015 Feb;12(2): 143-50).   GENERAL OBSERVATION:          Forward head, rounded shoulders, increased kyphosis in sitting                               SENSATION:          Light touch: Appears intact on exam, subjective report of N/T C6 C7           PALPATION: TTP with * pain sub occipitals, palpable nodule mid Cx spine   Cervical ROM   ROM ROM  07/05/2022  Flexion 60  Extension 50  Right lateral flexion 45  Left lateral flexion 30  Right rotation 60*  Left rotation 40*  Flexion rotation (normal is 30 degrees) N  Flexion rotation (normal is 30 degrees) N    (Blank rows = not tested, N = WNL, * = concordant pain)   UPPER EXTREMITY MMT:   MMT Right 07/05/2022 Left 07/05/2022  Shoulder flexion      Shoulder abduction (C5)      Shoulder ER      Shoulder IR      Middle trapezius 3+ 3+  Lower trapezius 3 3  Shoulder extension      Grip strength 40 60   Cervical  flexion (C1,C2)      Cervical S/B (C3)      Shoulder shrug (C4) c c  Elbow flexion (C6) c c  Elbow ext (C7) c c  Thumb ext (C8) c c  Finger abd (T1) c c  Grossly        (Blank rows = not tested, score listed is out of 5 possible points.  N = WNL, D = diminished, C = clear for gross weakness with myotome testing, * = concordant pain with testing)     JOINT MOBILITY TESTING:  WNL   PATIENT SURVEYS:  FOTO 61 -> 72              TODAY'S TREATMENT 08/12/2022:  Therapeutic Exercise: UBE lvl 2.0, 2 min fwd, 2 min bwd Prone I/W/T/Y 2x10 with 1# Prone chin tuck 10x5 sec Plank forearm/feet scapular protraction/retraction 2x10 Standing high row 2x10 10# L&R Bow and arrow standing 10x5 sec 10# Manual Therapy: Skilled palpation to identify trigger points for TDN STM to all listed muscles following TDN  Trigger Point Dry-Needling:  Treatment instructions: Expect mild to moderate muscle soreness. S/S of pneumothorax if dry needled over a lung field, and to seek immediate medical attention should they occur. Patient verbalized understanding of these instructions and education.   Patient Consent Given: Yes Education handout provided: No Muscles treated: cervical paraspinals, bil sub occipitals, bil UT Electrical stimulation performed: NO Parameters: N/a Treatment response/outcome: twitch, pain relief Self Care: Theracane and tennis ball for self massage/myofacial release                PATIENT EDUCATION:  POC, diagnosis, prognosis, HEP, and outcome measures.  Pt educated via explanation, demonstration, and handout (HEP).  Pt confirms understanding verbally.             HOME EXERCISE PROGRAM: Access Code: [email or web address] URL: [email or web address] Date: 07/15/2022 Prepared by: Octavio Manns  Exercises - Seated Passive Cervical Retraction  - 1 x daily - 7 x weekly - 3 sets - 10 reps - Shoulder External Rotation and Scapular Retraction with Resistance  - 1 x daily - 7  x weekly - 2 sets - 10 reps - red theraband hold - Standing Shoulder Horizontal Abduction with Resistance  - 1 x daily - 7 x weekly - 2 sets - 10 reps - red theraband hold - Standing Shoulder Row with Anchored Resistance  - 1 x daily - 7 x weekly - 3 sets - 10 reps - green theraband hold - Prone Scapular Retraction Y  - 1 x daily - 7 x weekly - 2-3 sets - 10 reps - Prone T  - 1 x daily - 7 x weekly - 2-3 sets - 10 reps   ASTERISK SIGNS     Asterisk Signs Eval (07/05/2022) 2/8           Headaches Constant up to 7/10 No HA in last 7 days           Mid trap 3+ bil            Low trap 3 bil            L rotation 40 degrees                               ASSESSMENT:   CLINICAL IMPRESSION: Zerelda continues to do well with current regimen. Progressed to include more core activation with standing midback exercises. Continued TPDN to decrease  headache tension.    OBJECTIVE IMPAIRMENTS: Pain, cervical ROM   ACTIVITY LIMITATIONS: Reading, head turns, ADLs   PERSONAL FACTORS: See medical history and pertinent history     GOALS:     SHORT TERM GOALS: Target date: 08/02/2022   Lakeithia will be >75% HEP compliant to improve carryover between sessions and facilitate independent management of condition   Evaluation (07/05/2022): ongoing Goal status: INITIAL     LONG TERM GOALS: Target date: 08/30/2022   Rosalea will improve FOTO score to 72 as a proxy for functional improvement   Evaluation/Baseline (07/05/2022): 61 Goal status: INITIAL     2.  Leani will self report >/= 50% decrease in pain from evaluation    Evaluation/Baseline (07/05/2022): 7/10 max pain Goal status: INITIAL     3.  Lanijah will improve the following MMTs to >/= 4/5 to show improvement in strength:  mid and lower traps    Evaluation/Baseline (07/05/2022): see chart in note Goal status: INITIAL     4.  Azzie will improve cervical L rotation to >/= 50 degrees   Evaluation/Baseline (07/05/2022): 40 degrees Goal status:  INITIAL     5.  Addi will improve HA frequency by 75%   Evaluation/Baseline (07/05/2022): constant Goal status: INITIAL     PLAN: PT FREQUENCY: 1-2x/week   PT DURATION: 8 weeks (Ending 08/30/2022)   PLANNED INTERVENTIONS: Therapeutic exercises, Aquatic therapy, Therapeutic activity, Neuro Muscular re-education, Gait training, Patient/Family education, Joint mobilization, Dry Needling, Electrical stimulation, Spinal mobilization and/or manipulation, Moist heat, Taping, Vasopneumatic device, Ionotophoresis 2m/ml Dexamethasone, and Manual therapy   PLAN FOR NEXT SESSION: cervical and periscapular strengthening, TDN   Liandra Mendia April Ma L Torian Quintero, PT 08/12/2022, 8:42 AM

## 2022-08-14 ENCOUNTER — Other Ambulatory Visit: Payer: Self-pay | Admitting: Psychiatry

## 2022-08-14 ENCOUNTER — Ambulatory Visit: Payer: 59 | Admitting: Family Medicine

## 2022-08-14 DIAGNOSIS — F39 Unspecified mood [affective] disorder: Secondary | ICD-10-CM

## 2022-08-14 DIAGNOSIS — J029 Acute pharyngitis, unspecified: Secondary | ICD-10-CM

## 2022-08-14 LAB — POCT RAPID STREP A (OFFICE): Rapid Strep A Screen: NEGATIVE

## 2022-08-14 NOTE — Progress Notes (Signed)
Established Patient Office Visit  Subjective   Patient ID: Rebecca Holt, female    DOB: [birth year]  Age: 37 y.o. MRN: [id number]  Chief Complaint  Patient presents with   Sore Throat    Patient complains of sore throat, x4 days, Tried Ibuprofen, Tylenol and Aleve with little relief    Irregular Heart Beat    HPI   Rebecca Holt is seen with sore throat for the past few days.  Her main concern was ruling out strep.  She has not had any fever.  No nasal congestion or cough.  She has past history of reflux but no active reflux symptoms.  She does have history of recurrent aphthous ulcers but has not noted any in the past couple of days.  She continues to battle profound fatigue.  She has had extensive workup with GI, rheumatology, and neurology with no clear etiology found.  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  Past Medical History:  Diagnosis Date   Anxiety    Arthritis    si joints   Bipolar disorder ([place])    Chicken pox    as child   Depressive disorder 02/08/2019   History of COVID-19 09/09/2020   g i issues x 2-3 weeks all symptoms reolved   History of hypothyroidism    yrs ago per pt on 04-17-2021   Pneumonia    yrs ago per pt on 04-17-2021   Wears glasses    Past Surgical History:  Procedure Laterality Date   ANTERIOR AND POSTERIOR REPAIR N/A 04/23/2021   Procedure: POSTERIOR REPAIR (RECTOCELE);  Surgeon: Paula Compton, MD;  Location: [facility];  Service: Gynecology;  Laterality: N/A;   BIOPSY  07/25/2021   Procedure: BIOPSY;  Surgeon: [name], MD;  Location: [facility];  Service: Gastroenterology;;   COLONOSCOPY WITH PROPOFOL N/A 07/25/2021   Procedure: COLONOSCOPY WITH PROPOFOL;  Surgeon: [name], MD;  Location: [facility];  Service: Gastroenterology;  Laterality: N/A;   CYSTOSCOPY N/A 04/23/2021   Procedure: CYSTOSCOPY;  Surgeon: Paula Compton,  MD;  Location: [facility];  Service: Gynecology;  Laterality: N/A;   ESOPHAGOGASTRODUODENOSCOPY (EGD) WITH PROPOFOL N/A 07/25/2021   Procedure: ESOPHAGOGASTRODUODENOSCOPY (EGD) WITH PROPOFOL;  Surgeon: [name], MD;  Location: [facility];  Service: Gastroenterology;  Laterality: N/A;   LAPAROSCOPIC VAGINAL HYSTERECTOMY WITH SALPINGECTOMY Bilateral 04/23/2021   Procedure: LAPAROSCOPIC ASSISTED VAGINAL HYSTERECTOMY WITH BILATERAL  SALPINGECTOMY;  Surgeon: Paula Compton, MD;  Location: [place];  Service: Gynecology;  Laterality: Bilateral;   TONSILLECTOMY Bilateral 2020   TRANSFORAMINAL LUMBAR INTERBODY FUSION (TLIF) WITH PEDICLE SCREW FIXATION 1 LEVEL N/A 05/07/2019   Procedure: Lumbar Five- Sacral One Transforaminal lumbar interbody Fusion;  Surgeon: Erline Levine, MD;  Location: [place];  Service: Neurosurgery;  Laterality: N/A;  Lumbar 5 Sacral 1 Transforaminal lumbar interbody fusion    reports that she has never smoked. She has never used smokeless tobacco. She reports that she does not drink alcohol and does not use drugs. family history includes Bipolar disorder in her mother and son; Cancer in her maternal grandfather and paternal grandfather; Diabetes in her father and paternal grandfather; Diabetic kidney disease in her father; Hyperlipidemia in her father; Mental illness in her mother; Thyroid cancer in her maternal grandmother. Allergies  Allergen Reactions   Tramadol Itching   Latex Other (See Comments)    Causes irritation.    Review of Systems  Constitutional:  Positive for malaise/fatigue. Negative for chills and fever.  HENT:  Positive for sore throat.   Respiratory:  Negative for cough.   Cardiovascular:  Negative for chest pain.      Objective:     LMP 04/23/2021 (Exact Date)  BP Readings from Last 3 Encounters:  05/29/22 120/70  05/07/22 114/76  03/30/22 106/82   Wt Readings from Last 3 Encounters:  05/29/22 132 lb (59.9  kg)  05/07/22 129 lb 1.6 oz (58.6 kg)  04/12/22 124 lb (56.2 kg)      Physical Exam Vitals reviewed.  Constitutional:      General: She is not in acute distress.    Appearance: She is not ill-appearing.  HENT:     Right Ear: Tympanic membrane normal.     Left Ear: Tympanic membrane normal.     Mouth/Throat:     Mouth: Mucous membranes are moist.     Pharynx: Oropharynx is clear. No oropharyngeal exudate or posterior oropharyngeal erythema.  Cardiovascular:     Rate and Rhythm: Normal rate and regular rhythm.  Pulmonary:     Effort: Pulmonary effort is normal.     Breath sounds: Normal breath sounds.  Musculoskeletal:     Cervical back: Neck supple.  Lymphadenopathy:     Cervical: No cervical adenopathy.  Neurological:     Mental Status: She is alert.      Results for orders placed or performed in visit on 08/14/22  POC Rapid Strep A  Result Value Ref Range   Rapid Strep A Screen Negative Negative      The ASCVD Risk score (Arnett DK, et al., 2019) failed to calculate for the following reasons:   The 2019 ASCVD risk score is only valid for ages 39 to 95    Assessment & Plan:   Problem List Items Addressed This Visit   None Visit Diagnoses     Sore throat    -  Primary   Relevant Orders   POC Rapid Strep A (Completed)     Rapid strep negative.  Etiology unclear.  Oropharynx exam is unremarkable.  No erythema.  Recommend observation for now.  Continue over-the-counter analgesics as needed.  Follow-up for any persistent symptoms.  No follow-ups on file.    Carolann Littler, MD

## 2022-08-14 NOTE — Therapy (Signed)
OUTPATIENT PHYSICAL THERAPY TREATMENT NOTE   Patient Name: Rebecca Holt MRN: [id number] DOB:1986-02-15, 37 y.o., female Today's Date: 08/15/2022  PCP: Karsten Ro, DO  REFERRING PROVIDER: Karsten Ro, DO   END OF SESSION:   PT End of Session - 08/15/22 0908     Visit Number 13    Number of Visits --   1-2x/week   Date for PT Re-Evaluation 08/30/22    Authorization Type [employer]    PT Start Time 0915    PT Stop Time 0955    PT Time Calculation (min) 40 min    Activity Tolerance Patient tolerated treatment well    Behavior During Therapy [facility] for tasks assessed/performed               Past Medical History:  Diagnosis Date   Anxiety    Arthritis    si joints   Bipolar disorder ([place])    Chicken pox    as child   Depressive disorder 02/08/2019   History of COVID-19 09/09/2020   g i issues x 2-3 weeks all symptoms reolved   History of hypothyroidism    yrs ago per pt on 04-17-2021   Pneumonia    yrs ago per pt on 04-17-2021   Wears glasses    Past Surgical History:  Procedure Laterality Date   ANTERIOR AND POSTERIOR REPAIR N/A 04/23/2021   Procedure: POSTERIOR REPAIR (RECTOCELE);  Surgeon: Paula Compton, MD;  Location: [facility];  Service: Gynecology;  Laterality: N/A;   BIOPSY  07/25/2021   Procedure: BIOPSY;  Surgeon: [name], MD;  Location: [facility];  Service: Gastroenterology;;   COLONOSCOPY WITH PROPOFOL N/A 07/25/2021   Procedure: COLONOSCOPY WITH PROPOFOL;  Surgeon: [name], MD;  Location: [facility];  Service: Gastroenterology;  Laterality: N/A;   CYSTOSCOPY N/A 04/23/2021   Procedure: CYSTOSCOPY;  Surgeon: Paula Compton, MD;  Location: [facility];  Service: Gynecology;  Laterality: N/A;   ESOPHAGOGASTRODUODENOSCOPY (EGD) WITH PROPOFOL N/A 07/25/2021   Procedure: ESOPHAGOGASTRODUODENOSCOPY (EGD) WITH PROPOFOL;  Surgeon: [name], MD;  Location: [facility];  Service: Gastroenterology;  Laterality:  N/A;   LAPAROSCOPIC VAGINAL HYSTERECTOMY WITH SALPINGECTOMY Bilateral 04/23/2021   Procedure: LAPAROSCOPIC ASSISTED VAGINAL HYSTERECTOMY WITH BILATERAL  SALPINGECTOMY;  Surgeon: Paula Compton, MD;  Location: [place];  Service: Gynecology;  Laterality: Bilateral;   TONSILLECTOMY Bilateral 2020   TRANSFORAMINAL LUMBAR INTERBODY FUSION (TLIF) WITH PEDICLE SCREW FIXATION 1 LEVEL N/A 05/07/2019   Procedure: Lumbar Five- Sacral One Transforaminal lumbar interbody Fusion;  Surgeon: Erline Levine, MD;  Location: [place];  Service: Neurosurgery;  Laterality: N/A;  Lumbar 5 Sacral 1 Transforaminal lumbar interbody fusion   Patient Active Problem List   Diagnosis Date Noted   Iron deficiency anemia 10/17/2021   Lumbar post-laminectomy syndrome 10/17/2021   Lumbar spondylosis 10/17/2021   S/P laparoscopic assisted vaginal hysterectomy (LAVH) 04/23/2021   Dyspareunia in female 04/11/2021   Dysmenorrhea 04/11/2021   Exocrine pancreatic insufficiency 03/23/2021   Panic attacks 03/20/2020   Status post lumbar spinal fusion 03/20/2020   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 Has continued HEP compliance.    PAIN:  Are you having pain? Yes Pain location: neck and  headache NPRS scale: 3/10 Aggravating factors: light Relieving factors: no relieving factors Pain description:  "really bad tension headache" Stage: Subacute Stability: staying the same 24 hour pattern: better in the morning, early afternoon it gets worse    OBJECTIVE: (objective measures completed at initial evaluation unless otherwise dated)  DIAGNOSTIC FINDINGS:  MRI Cx   IMPRESSION: 1. Moderate left foraminal stenosis at C3-4 secondary to asymmetric left-sided uncovertebral and facet hypertrophy. 2. Mild left foraminal narrowing at C4-5 secondary to uncovertebral and facet hypertrophy. 3. Mild uncovertebral spurring bilaterally at C5-6 and C6-7 without significant stenosis. 4. No focal osseous lesions are present. The spinous process of C7 is likely palpable. 5. 2.4 x 2.4 cm nodule in the left lobe of the thyroid, previously biopsied at [facility]. This has been evaluated on previous imaging. (ref: J Am Coll Radiol. 2015 Feb;12(2): 143-50).   GENERAL OBSERVATION:          Forward head, rounded shoulders, increased kyphosis in sitting                               SENSATION:          Light touch: Appears intact on exam, subjective report of N/T C6 C7           PALPATION: TTP with * pain sub occipitals, palpable nodule mid Cx spine   Cervical ROM   ROM ROM  07/05/2022  Flexion 60  Extension 50  Right lateral flexion 45  Left lateral flexion 30  Right rotation 60*  Left rotation 40*  Flexion rotation (normal is 30 degrees) N  Flexion rotation (normal is 30 degrees) N    (Blank rows = not tested, N = WNL, * = concordant pain)   UPPER EXTREMITY MMT:   MMT Right 07/05/2022 Left 07/05/2022  Shoulder flexion      Shoulder abduction (C5)      Shoulder ER      Shoulder IR      Middle trapezius 3+ 3+  Lower trapezius 3 3  Shoulder extension      Grip strength 40 60  Cervical flexion (C1,C2)      Cervical S/B (C3)      Shoulder shrug (C4) c c  Elbow flexion (C6)  c c  Elbow ext (C7) c c  Thumb ext (C8) c c  Finger abd (T1) c c  Grossly        (Blank rows = not tested, score listed is out of 5 possible points.  N = WNL, D = diminished, C = clear for gross weakness with myotome testing, * = concordant pain with testing)     JOINT MOBILITY TESTING:  WNL   PATIENT SURVEYS:  FOTO 61 -> 72              TODAY'S TREATMENT 08/15/2022: Therapeutic Exercise: UBE lvl 2.0, 2 min fwd, 2 min bwd Prone I/T/Y 2x10 with 1# Prone on elbows chin tuck 10x5 sec Plank from knees scapular push up 2x10 Horizontal abd cross with cables 3# x 10 Standing high row 2x10 17# Standing W at wall 2x10 Standing chin tuck with ball x 10 Manual Therapy: Skilled palpation to identify trigger points for TDN STM to all listed muscles following  TDN Positional release to bilateral upper traps Suboccipital release  Trigger Point Dry-Needling:  Treatment instructions: Expect mild to moderate muscle soreness. S/S of pneumothorax if dry needled over a lung field, and to seek immediate medical attention should they occur. Patient verbalized understanding of these instructions and education.   Patient Consent Given: Yes Education handout provided: No Muscles treated: cervical paraspinals, bil sub occipitals, bil UT Electrical stimulation performed: NO Parameters: N/a Treatment response/outcome: twitch, pain relief               PATIENT EDUCATION:  POC, diagnosis, prognosis, HEP, and outcome measures.  Pt educated via explanation, demonstration, and handout (HEP).  Pt confirms understanding verbally.             HOME EXERCISE PROGRAM: Access Code: [email or web address] URL: [email or web address] Date: 07/15/2022 Prepared by: Octavio Manns  Exercises - Seated Passive Cervical Retraction  - 1 x daily - 7 x weekly - 3 sets - 10 reps - Shoulder External Rotation and Scapular Retraction with Resistance  - 1 x daily - 7 x weekly - 2 sets - 10 reps - red theraband hold -  Standing Shoulder Horizontal Abduction with Resistance  - 1 x daily - 7 x weekly - 2 sets - 10 reps - red theraband hold - Standing Shoulder Row with Anchored Resistance  - 1 x daily - 7 x weekly - 3 sets - 10 reps - green theraband hold - Prone Scapular Retraction Y  - 1 x daily - 7 x weekly - 2-3 sets - 10 reps - Prone T  - 1 x daily - 7 x weekly - 2-3 sets - 10 reps   ASTERISK SIGNS     Asterisk Signs Eval (07/05/2022) 2/8           Headaches Constant up to 7/10 No HA in last 7 days           Mid trap 3+ bil            Low trap 3 bil            L rotation 40 degrees                               ASSESSMENT:   CLINICAL IMPRESSION: Pt able to complete all precribed exercises no adverse effect. Responded well to TPDN and manual therapy. Exercises focused on improving periscapular and DNF strength in order to decrease pain. Will continue per POC, assess goals next session.    OBJECTIVE IMPAIRMENTS: Pain, cervical ROM   ACTIVITY LIMITATIONS: Reading, head turns, ADLs   PERSONAL FACTORS: See medical history and pertinent history     GOALS:     SHORT TERM GOALS: Target date: 08/02/2022   Shakiela will be >75% HEP compliant to improve carryover between sessions and facilitate independent management of condition   Evaluation (07/05/2022): ongoing Goal status: INITIAL     LONG TERM GOALS: Target date: 08/30/2022   Demarie will improve FOTO score to 72 as a proxy for functional improvement   Evaluation/Baseline (07/05/2022): 61 Goal status: INITIAL     2.  Akyla will self report >/= 50% decrease in pain from evaluation    Evaluation/Baseline (07/05/2022): 7/10 max pain Goal status: INITIAL     3.  [place] will improve the following MMTs to >/= 4/5 to show improvement in strength:  mid and lower traps    Evaluation/Baseline (07/05/2022): see chart in note Goal status:  INITIAL     4.  Myriam will improve cervical L rotation to >/= 50 degrees   Evaluation/Baseline (07/05/2022): 40  degrees Goal status: INITIAL     5.  Ivanna will improve HA frequency by 75%   Evaluation/Baseline (07/05/2022): constant Goal status: INITIAL     PLAN: PT FREQUENCY: 1-2x/week   PT DURATION: 8 weeks (Ending 08/30/2022)   PLANNED INTERVENTIONS: Therapeutic exercises, Aquatic therapy, Therapeutic activity, Neuro Muscular re-education, Gait training, Patient/Family education, Joint mobilization, Dry Needling, Electrical stimulation, Spinal mobilization and/or manipulation, Moist heat, Taping, Vasopneumatic device, Ionotophoresis 90m/ml Dexamethasone, and Manual therapy   PLAN FOR NEXT SESSION: cervical and periscapular strengthening, TDN   DWard Chatters PT 08/15/2022, 9:56 AM

## 2022-08-15 ENCOUNTER — Ambulatory Visit: Payer: 59

## 2022-08-15 DIAGNOSIS — M542 Cervicalgia: Secondary | ICD-10-CM

## 2022-08-15 DIAGNOSIS — M6281 Muscle weakness (generalized): Secondary | ICD-10-CM

## 2022-08-17 ENCOUNTER — Other Ambulatory Visit: Payer: Self-pay | Admitting: Neurology

## 2022-08-17 ENCOUNTER — Other Ambulatory Visit: Payer: Self-pay | Admitting: Psychiatry

## 2022-08-19 ENCOUNTER — Ambulatory Visit: Payer: 59

## 2022-08-19 DIAGNOSIS — M6281 Muscle weakness (generalized): Secondary | ICD-10-CM

## 2022-08-19 DIAGNOSIS — M542 Cervicalgia: Secondary | ICD-10-CM

## 2022-08-19 NOTE — Therapy (Signed)
OUTPATIENT PHYSICAL THERAPY TREATMENT NOTE/RE-CERT   Patient Name: Rebecca Holt MRN: [id number] DOB:[birth year], 37 y.o., female Today's Date: 08/19/2022  PCP: Karsten Ro, DO  REFERRING PROVIDER: Karsten Ro, DO   END OF SESSION:   PT End of Session - 08/19/22 0911     Visit Number 14    Number of Visits --   1-2x/week   Date for PT Re-Evaluation 09/09/22    Authorization Type Aet[employer]    PT Start Time 0915    PT Stop Time 0955    PT Time Calculation (min) 40 min    Activity Tolerance Patient tolerated treatment well    Behavior During Therapy [facility] for tasks assessed/performed                Past Medical History:  Diagnosis Date   Anxiety    Arthritis    si joints   Bipolar disorder ([place])    Chicken pox    as child   Depressive disorder 02/08/2019   History of COVID-19 09/09/2020   g i issues x 2-3 weeks all symptoms reolved   History of hypothyroidism    yrs ago per pt on 04-17-2021   Pneumonia    yrs ago per pt on 04-17-2021   Wears glasses    Past Surgical History:  Procedure Laterality Date   ANTERIOR AND POSTERIOR REPAIR N/A 04/23/2021   Procedure: POSTERIOR REPAIR (RECTOCELE);  Surgeon: Paula Compton, MD;  Location: [facility];  Service: Gynecology;  Laterality: N/A;   BIOPSY  07/25/2021   Procedure: BIOPSY;  Surgeon: [name], MD;  Location: [facility];  Service: Gastroenterology;;   COLONOSCOPY WITH PROPOFOL N/A 07/25/2021   Procedure: COLONOSCOPY WITH PROPOFOL;  Surgeon: [name], MD;  Location: [facility];  Service: Gastroenterology;  Laterality: N/A;   CYSTOSCOPY N/A 04/23/2021   Procedure: CYSTOSCOPY;  Surgeon: Paula Compton, MD;  Location: [facility];  Service: Gynecology;  Laterality: N/A;   ESOPHAGOGASTRODUODENOSCOPY (EGD) WITH PROPOFOL N/A 07/25/2021   Procedure: ESOPHAGOGASTRODUODENOSCOPY (EGD) WITH PROPOFOL;  Surgeon: [name], MD;  Location: [facility];  Service: Gastroenterology;   Laterality: N/A;   LAPAROSCOPIC VAGINAL HYSTERECTOMY WITH SALPINGECTOMY Bilateral 04/23/2021   Procedure: LAPAROSCOPIC ASSISTED VAGINAL HYSTERECTOMY WITH BILATERAL  SALPINGECTOMY;  Surgeon: Paula Compton, MD;  Location: [place];  Service: Gynecology;  Laterality: Bilateral;   TONSILLECTOMY Bilateral 2020   TRANSFORAMINAL LUMBAR INTERBODY FUSION (TLIF) WITH PEDICLE SCREW FIXATION 1 LEVEL N/A 05/07/2019   Procedure: Lumbar Five- Sacral One Transforaminal lumbar interbody Fusion;  Surgeon: Erline Levine, MD;  Location: [place];  Service: Neurosurgery;  Laterality: N/A;  Lumbar 5 Sacral 1 Transforaminal lumbar interbody fusion   Patient Active Problem List   Diagnosis Date Noted   Iron deficiency anemia 10/17/2021   Lumbar post-laminectomy syndrome 10/17/2021   Lumbar spondylosis 10/17/2021   S/P laparoscopic assisted vaginal hysterectomy (LAVH) 04/23/2021   Dyspareunia in female 04/11/2021   Dysmenorrhea 04/11/2021   Exocrine pancreatic insufficiency 03/23/2021   Panic attacks 03/20/2020   Status post lumbar spinal fusion 03/20/2020   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 Has continued HEP compliance and states  she is continuing to do well.    PAIN:  Are you having pain? Yes Pain location: neck and headache NPRS scale: 4/10 Aggravating factors: light Relieving factors: no relieving factors Pain description:  "really bad tension headache" Stage: Subacute Stability: staying the same 24 hour pattern: better in the morning, early afternoon it gets worse    OBJECTIVE: (objective measures completed at initial evaluation unless otherwise dated)  DIAGNOSTIC FINDINGS:  MRI Cx   IMPRESSION: 1. Moderate left foraminal stenosis at C3-4 secondary to asymmetric left-sided uncovertebral and facet hypertrophy. 2. Mild left foraminal narrowing at C4-5 secondary to uncovertebral and facet hypertrophy. 3. Mild uncovertebral spurring bilaterally at C5-6 and C6-7 without significant stenosis. 4. No focal osseous lesions are present. The spinous process of C7 is likely palpable. 5. 2.4 x 2.4 cm nodule in the left lobe of the thyroid, previously biopsied at [facility]. This has been evaluated on previous imaging. (ref: J Am Coll Radiol. 2015 Feb;12(2): 143-50).   GENERAL OBSERVATION:          Forward head, rounded shoulders, increased kyphosis in sitting                               SENSATION:          Light touch: Appears intact on exam, subjective report of N/T C6 C7           PALPATION: TTP with * pain sub occipitals, palpable nodule mid Cx spine   Cervical ROM   ROM ROM  07/05/2022  Flexion 60  Extension 50  Right lateral flexion 45  Left lateral flexion 30  Right rotation 60*  Left rotation 40*  Flexion rotation (normal is 30 degrees) N  Flexion rotation (normal is 30 degrees) N    (Blank rows = not tested, N = WNL, * = concordant pain)   UPPER EXTREMITY MMT:   MMT Right 07/05/2022 Left 07/05/2022  Shoulder flexion      Shoulder abduction (C5)      Shoulder ER      Shoulder IR      Middle trapezius 3+ 3+  Lower trapezius 3 3  Shoulder extension      Grip strength 40 60   Cervical flexion (C1,C2)      Cervical S/B (C3)      Shoulder shrug (C4) c c  Elbow flexion (C6) c c  Elbow ext (C7) c c  Thumb ext (C8) c c  Finger abd (T1) c c  Grossly        (Blank rows = not tested, score listed is out of 5 possible points.  N = WNL, D = diminished, C = clear for gross weakness with myotome testing, * = concordant pain with testing)     JOINT MOBILITY TESTING:  WNL   PATIENT SURVEYS:  FOTO 61 -> 72               TODAY'S TREATMENT 08/19/2022: Therapeutic Activity: Assessment of tests/measure, goals, and outcomes for recert Manual Therapy: Skilled palpation to identify trigger points for TDN STM to all listed muscles following TDN Positional release to bilateral upper traps Suboccipital release  Trigger Point Dry-Needling:  Treatment instructions: Expect mild to moderate muscle soreness. S/S of pneumothorax if dry  needled over a lung field, and to seek immediate medical attention should they occur. Patient verbalized understanding of these instructions and education.   Patient Consent Given: Yes Education handout provided: No Muscles treated: cervical paraspinals, bil sub occipitals, bil UT, L cervical paraspinal Electrical stimulation performed: NO Parameters: N/a Treatment response/outcome: twitch, pain relief               PATIENT EDUCATION:  POC, diagnosis, prognosis, HEP, and outcome measures.  Pt educated via explanation, demonstration, and handout (HEP).  Pt confirms understanding verbally.             HOME EXERCISE PROGRAM: Access Code: [email or web address] URL: [email or web address] Date: 07/15/2022 Prepared by: Octavio Manns  Exercises - Seated Passive Cervical Retraction  - 1 x daily - 7 x weekly - 3 sets - 10 reps - Shoulder External Rotation and Scapular Retraction with Resistance  - 1 x daily - 7 x weekly - 2 sets - 10 reps - red theraband hold - Standing Shoulder Horizontal Abduction with Resistance  - 1 x daily - 7 x weekly - 2  sets - 10 reps - red theraband hold - Standing Shoulder Row with Anchored Resistance  - 1 x daily - 7 x weekly - 3 sets - 10 reps - green theraband hold - Prone Scapular Retraction Y  - 1 x daily - 7 x weekly - 2-3 sets - 10 reps - Prone T  - 1 x daily - 7 x weekly - 2-3 sets - 10 reps   ASTERISK SIGNS     Asterisk Signs Eval (07/05/2022) 2/8  2/19         Headaches Constant up to 7/10 No HA in last 7 days   2 days ago; 6/10        Mid trap 3+ bil    4/5        Low trap 3 bil    4/5        L rotation 40    46        FOTO:       56%            ASSESSMENT:   CLINICAL IMPRESSION: Pt responded well to manual therapy interventions, reporting decrease pain and improved cervical ROM to 55 post. Over the course of PT she is continuing to progress well, noting decrease pain and HA frequency. Will continue to benefit from skilled therapy and manual interventions working towards discharge with HEP and occasional TPDN at [facility] locally.    OBJECTIVE IMPAIRMENTS: Pain, cervical ROM   ACTIVITY LIMITATIONS: Reading, head turns, ADLs   PERSONAL FACTORS: See medical history and pertinent history     GOALS:     SHORT TERM GOALS: Target date: 08/02/2022   Rebecca Holt will be >75% HEP compliant to improve carryover between sessions and facilitate independent management of condition   Evaluation (07/05/2022): ongoing Goal status: MET     LONG TERM GOALS: Target date: 09/09/2022   Rebecca Holt will improve FOTO score to 72 as a proxy for functional improvement   Evaluation/Baseline (07/05/2022): 61 08/19/22: 56% Goal status: ONGOING     2.  Rebecca Holt will self report >/= 50% decrease in pain from evaluation    Evaluation/Baseline (07/05/2022): 7/10 max pain 08/09/22: 6/10 max Goal status: MET     3.  Rebecca Holt will improve the following MMTs to >/= 4/5 to show improvement in strength:  mid and lower traps    Evaluation/Baseline (07/05/2022): see chart in note 08/19/22:  4/5 Goal status: MET     4.   Rebecca Holt will improve cervical L rotation to >/= 50 degrees   Evaluation/Baseline (07/05/2022): 40 degrees 08/19/22: 46 degrees Goal status: ONGOING     5.  Rebecca Holt will improve HA frequency by 75%   Evaluation/Baseline (07/05/2022): constant 08/19/22: 50%  Goal status: ONGOING     PLAN: PT FREQUENCY: 1x/week   PT DURATION: 3 weeks (Ending 09/09/2022)   PLANNED INTERVENTIONS: Therapeutic exercises, Aquatic therapy, Therapeutic activity, Neuro Muscular re-education, Gait training, Patient/Family education, Joint mobilization, Dry Needling, Electrical stimulation, Spinal mobilization and/or manipulation, Moist heat, Taping, Vasopneumatic device, Ionotophoresis 79m/ml Dexamethasone, and Manual therapy   PLAN FOR NEXT SESSION: cervical and periscapular strengthening, TDN   DWard Chatters PT 08/19/2022, 10:32 AM

## 2022-08-26 NOTE — Therapy (Signed)
OUTPATIENT PHYSICAL THERAPY TREATMENT NOTE   Patient Name: Rebecca Holt MRN: [id number] DOB:[birth year], 37 y.o., female Today's Date: 08/27/2022  PCP: Karsten Ro, DO  REFERRING PROVIDER: Karsten Ro, DO   END OF SESSION:   PT End of Session - 08/27/22 0910     Visit Number 15    Number of Visits --   1-2x/week   Date for PT Re-Evaluation 09/09/22    Authorization Type Aetna    PT Start Time 0915    PT Stop Time 0954    PT Time Calculation (min) 39 min    Activity Tolerance Patient tolerated treatment well    Behavior During Therapy [facility] for tasks assessed/performed                 Past Medical History:  Diagnosis Date   Anxiety    Arthritis    si joints   Bipolar disorder ([place])    Chicken pox    as child   Depressive disorder 02/08/2019   History of COVID-19 09/09/2020   g i issues x 2-3 weeks all symptoms reolved   History of hypothyroidism    yrs ago per pt on 04-17-2021   Pneumonia    yrs ago per pt on 04-17-2021   Wears glasses    Past Surgical History:  Procedure Laterality Date   ANTERIOR AND POSTERIOR REPAIR N/A 04/23/2021   Procedure: POSTERIOR REPAIR (RECTOCELE);  Surgeon: Paula Compton, MD;  Location: [facility];  Service: Gynecology;  Laterality: N/A;   BIOPSY  07/25/2021   Procedure: BIOPSY;  Surgeon: [name], MD;  Location: [facility];  Service: Gastroenterology;;   COLONOSCOPY WITH PROPOFOL N/A 07/25/2021   Procedure: COLONOSCOPY WITH PROPOFOL;  Surgeon: [name], MD;  Location: [facility];  Service: Gastroenterology;  Laterality: N/A;   CYSTOSCOPY N/A 04/23/2021   Procedure: CYSTOSCOPY;  Surgeon: Paula Compton, MD;  Location: [facility];  Service: Gynecology;  Laterality: N/A;   ESOPHAGOGASTRODUODENOSCOPY (EGD) WITH PROPOFOL N/A 07/25/2021   Procedure: ESOPHAGOGASTRODUODENOSCOPY (EGD) WITH PROPOFOL;  Surgeon: [name], MD;  Location: [facility];  Service: Gastroenterology;   Laterality: N/A;   LAPAROSCOPIC VAGINAL HYSTERECTOMY WITH SALPINGECTOMY Bilateral 04/23/2021   Procedure: LAPAROSCOPIC ASSISTED VAGINAL HYSTERECTOMY WITH BILATERAL  SALPINGECTOMY;  Surgeon: Paula Compton, MD;  Location: [place];  Service: Gynecology;  Laterality: Bilateral;   TONSILLECTOMY Bilateral 2020   TRANSFORAMINAL LUMBAR INTERBODY FUSION (TLIF) WITH PEDICLE SCREW FIXATION 1 LEVEL N/A 05/07/2019   Procedure: Lumbar Five- Sacral One Transforaminal lumbar interbody Fusion;  Surgeon: Erline Levine, MD;  Location: [place];  Service: Neurosurgery;  Laterality: N/A;  Lumbar 5 Sacral 1 Transforaminal lumbar interbody fusion   Patient Active Problem List   Diagnosis Date Noted   Iron deficiency anemia 10/17/2021   Lumbar post-laminectomy syndrome 10/17/2021   Lumbar spondylosis 10/17/2021   S/P laparoscopic assisted vaginal hysterectomy (LAVH) 04/23/2021   Dyspareunia in female 04/11/2021   Dysmenorrhea 04/11/2021   Exocrine pancreatic insufficiency 03/23/2021   Panic attacks 03/20/2020   Status post lumbar spinal fusion 03/20/2020   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   Lumbar foraminal stenosis 05/07/2019   GAD (generalized anxiety disorder) 03/10/2019   OCD (obsessive compulsive disorder) 03/10/2019   Major depression, recurrent, chronic ([place]) 02/08/2019    REFERRING DIAG: M54.2 (ICD-10-CM) - Cervicalgia   THERAPY DIAG:  Cervicalgia  Muscle weakness  Rationale for Evaluation and Treatment Rehabilitation  PERTINENT HISTORY: Lumbar fusion, post-laminectomy syndrome     PRECAUTIONS: None  SUBJECTIVE:                                                                                                                                                                                      SUBJECTIVE STATEMENT:  Pt presents to PT with reports of decreased HA symptoms. Has been compliant with HEP.    PAIN:  Are you having pain? Yes Pain location: neck and  headache NPRS scale: 2/10 Aggravating factors: light Relieving factors: no relieving factors Pain description:  "really bad tension headache" Stage: Subacute Stability: staying the same 24 hour pattern: better in the morning, early afternoon it gets worse    OBJECTIVE: (objective measures completed at initial evaluation unless otherwise dated)  DIAGNOSTIC FINDINGS:  MRI Cx   IMPRESSION: 1. Moderate left foraminal stenosis at C3-4 secondary to asymmetric left-sided uncovertebral and facet hypertrophy. 2. Mild left foraminal narrowing at C4-5 secondary to uncovertebral and facet hypertrophy. 3. Mild uncovertebral spurring bilaterally at C5-6 and C6-7 without significant stenosis. 4. No focal osseous lesions are present. The spinous process of C7 is likely palpable. 5. 2.4 x 2.4 cm nodule in the left lobe of the thyroid, previously biopsied at [facility]. This has been evaluated on previous imaging. (ref: J Am Coll Radiol. 2015 Feb;12(2): 143-50).   GENERAL OBSERVATION:          Forward head, rounded shoulders, increased kyphosis in sitting                               SENSATION:          Light touch: Appears intact on exam, subjective report of N/T C6 C7           PALPATION: TTP with * pain sub occipitals, palpable nodule mid Cx spine   Cervical ROM   ROM ROM  07/05/2022  Flexion 60  Extension 50  Right lateral flexion 45  Left lateral flexion 30  Right rotation 60*  Left rotation 40*  Flexion rotation (normal is 30 degrees) N  Flexion rotation (normal is 30 degrees) N    (Blank rows = not tested, N = WNL, * = concordant pain)   UPPER EXTREMITY MMT:   MMT Right 07/05/2022 Left 07/05/2022  Shoulder flexion      Shoulder abduction (C5)      Shoulder ER      Shoulder IR      Middle trapezius 3+ 3+  Lower trapezius 3 3  Shoulder extension      Grip strength 40 60  Cervical flexion (C1,C2)      Cervical S/B (C3)  Shoulder shrug (C4) c c  Elbow flexion (C6)  c c  Elbow ext (C7) c c  Thumb ext (C8) c c  Finger abd (T1) c c  Grossly        (Blank rows = not tested, score listed is out of 5 possible points.  N = WNL, D = diminished, C = clear for gross weakness with myotome testing, * = concordant pain with testing)     JOINT MOBILITY TESTING:  WNL   PATIENT SURVEYS:  FOTO 61 -> 72               TODAY'S TREATMENT 08/27/2022: Therapeutic Exercise: UBE lvl 2.0, 3 min fwd Seated low row 2x10 30# Lat pulldown 2x10 30# Prone I/T/Y 2x10 with 2# Prone on elbows chin tuck x 10 - 5 sec Plank into scapular push up 2x10 Serratus wall slide with foam YTB x 15 Manual Therapy: Skilled palpation to identify trigger points for TDN STM to all listed muscles following TDN Positional release to bilateral upper traps Suboccipital release  Trigger Point Dry-Needling:  Treatment instructions: Expect mild to moderate muscle soreness. S/S of pneumothorax if dry needled over a lung field, and to seek immediate medical attention should they occur. Patient verbalized understanding of these instructions and education.   Patient Consent Given: Yes Education handout provided: No Muscles treated: cervical paraspinals, bil sub occipitals, bil UT, L cervical paraspinal Electrical stimulation performed: NO Parameters: N/a Treatment response/outcome: twitch, pain relief               PATIENT EDUCATION:  POC, diagnosis, prognosis, HEP, and outcome measures.  Pt educated via explanation, demonstration, and handout (HEP).  Pt confirms understanding verbally.             HOME EXERCISE PROGRAM: Access Code: [email or web address] URL: [email or web address] Date: 07/15/2022 Prepared by: Octavio Manns  Exercises - Seated Passive Cervical Retraction  - 1 x daily - 7 x weekly - 3 sets - 10 reps - Shoulder External Rotation and Scapular Retraction with Resistance  - 1 x daily - 7 x weekly - 2 sets - 10 reps - red theraband hold - Standing Shoulder Horizontal Abduction  with Resistance  - 1 x daily - 7 x weekly - 2 sets - 10 reps - red theraband hold - Standing Shoulder Row with Anchored Resistance  - 1 x daily - 7 x weekly - 3 sets - 10 reps - green theraband hold - Prone Scapular Retraction Y  - 1 x daily - 7 x weekly - 2-3 sets - 10 reps - Prone T  - 1 x daily - 7 x weekly - 2-3 sets - 10 reps   ASTERISK SIGNS     Asterisk Signs Eval (07/05/2022) 2/8  2/19         Headaches Constant up to 7/10 No HA in last 7 days   2 days ago; 6/10        Mid trap 3+ bil    4/5        Low trap 3 bil    4/5        L rotation 40    46        FOTO:       56%            ASSESSMENT:   CLINICAL IMPRESSION: Pt able to complete all precribed exercises no adverse effect. Responded well to TPDN and manual therapy. Exercises focused on improving periscapular and DNF strength in order  to decrease pain. Will continue per POC, assess goals next session.     OBJECTIVE IMPAIRMENTS: Pain, cervical ROM   ACTIVITY LIMITATIONS: Reading, head turns, ADLs   PERSONAL FACTORS: See medical history and pertinent history     GOALS:     SHORT TERM GOALS: Target date: 08/02/2022   Taletha will be >75% HEP compliant to improve carryover between sessions and facilitate independent management of condition   Evaluation (07/05/2022): ongoing Goal status: MET     LONG TERM GOALS: Target date: 09/09/2022   Bettie will improve FOTO score to 72 as a proxy for functional improvement   Evaluation/Baseline (07/05/2022): 61 08/19/22: 56% Goal status: ONGOING     2.  Rhina will self report >/= 50% decrease in pain from evaluation    Evaluation/Baseline (07/05/2022): 7/10 max pain 08/09/22: 6/10 max Goal status: MET     3.  Eliyanah will improve the following MMTs to >/= 4/5 to show improvement in strength:  mid and lower traps    Evaluation/Baseline (07/05/2022): see chart in note 08/19/22: 4/5 Goal status: MET     4.  Jaleiyah will improve cervical L rotation to >/= 50 degrees    Evaluation/Baseline (07/05/2022): 40 degrees 08/19/22: 46 degrees Goal status: ONGOING     5.  Evian will improve HA frequency by 75%   Evaluation/Baseline (07/05/2022): constant 08/19/22: 50%  Goal status: ONGOING     PLAN: PT FREQUENCY: 1x/week   PT DURATION: 3 weeks (Ending 09/09/2022)   PLANNED INTERVENTIONS: Therapeutic exercises, Aquatic therapy, Therapeutic activity, Neuro Muscular re-education, Gait training, Patient/Family education, Joint mobilization, Dry Needling, Electrical stimulation, Spinal mobilization and/or manipulation, Moist heat, Taping, Vasopneumatic device, Ionotophoresis '4mg'$ /ml Dexamethasone, and Manual therapy   PLAN FOR NEXT SESSION: cervical and periscapular strengthening, TDN   Ward Chatters, PT 08/27/2022, 9:54 AM

## 2022-08-27 ENCOUNTER — Ambulatory Visit: Payer: 59

## 2022-08-27 DIAGNOSIS — M542 Cervicalgia: Secondary | ICD-10-CM | POA: Diagnosis not present

## 2022-08-27 DIAGNOSIS — M6281 Muscle weakness (generalized): Secondary | ICD-10-CM

## 2022-08-31 ENCOUNTER — Other Ambulatory Visit: Payer: Self-pay | Admitting: Psychiatry

## 2022-08-31 DIAGNOSIS — G4700 Insomnia, unspecified: Secondary | ICD-10-CM

## 2022-08-31 DIAGNOSIS — F410 Panic disorder [episodic paroxysmal anxiety] without agoraphobia: Secondary | ICD-10-CM

## 2022-09-02 ENCOUNTER — Ambulatory Visit: Payer: 59 | Attending: Neurological Surgery

## 2022-09-02 DIAGNOSIS — M6281 Muscle weakness (generalized): Secondary | ICD-10-CM | POA: Diagnosis present

## 2022-09-02 DIAGNOSIS — M542 Cervicalgia: Secondary | ICD-10-CM | POA: Insufficient documentation

## 2022-09-02 NOTE — Therapy (Signed)
OUTPATIENT PHYSICAL THERAPY TREATMENT NOTE   Patient Name: Rebecca Holt MRN: [id number] DOB:Mar 14, 1986, 37 y.o., female 67 Date: 09/02/2022  PCP: Karsten Ro, DO  REFERRING PROVIDER: Karsten Ro, DO   END OF SESSION:   PT End of Session - 09/02/22 0912     Visit Number 16    Number of Visits --   1-2x/week   Date for PT Re-Evaluation 09/09/22    Authorization Type Aetna    PT Start Time 0915    PT Stop Time 0955    PT Time Calculation (min) 40 min    Activity Tolerance Patient tolerated treatment well    Behavior During Therapy [facility] for tasks assessed/performed                  Past Medical History:  Diagnosis Date   Anxiety    Arthritis    si joints   Bipolar disorder ([place])    Chicken pox    as child   Depressive disorder 02/08/2019   History of COVID-19 09/09/2020   g i issues x 2-3 weeks all symptoms reolved   History of hypothyroidism    yrs ago per pt on 04-17-2021   Pneumonia    yrs ago per pt on 04-17-2021   Wears glasses    Past Surgical History:  Procedure Laterality Date   ANTERIOR AND POSTERIOR REPAIR N/A 04/23/2021   Procedure: POSTERIOR REPAIR (RECTOCELE);  Surgeon: Paula Compton, MD;  Location: [facility];  Service: Gynecology;  Laterality: N/A;   BIOPSY  07/25/2021   Procedure: BIOPSY;  Surgeon: [name], MD;  Location: [facility];  Service: Gastroenterology;;   COLONOSCOPY WITH PROPOFOL N/A 07/25/2021   Procedure: COLONOSCOPY WITH PROPOFOL;  Surgeon: [name], MD;  Location: [facility];  Service: Gastroenterology;  Laterality: N/A;   CYSTOSCOPY N/A 04/23/2021   Procedure: CYSTOSCOPY;  Surgeon: Paula Compton, MD;  Location: [facility];  Service: Gynecology;  Laterality: N/A;   ESOPHAGOGASTRODUODENOSCOPY (EGD) WITH PROPOFOL N/A 07/25/2021   Procedure: ESOPHAGOGASTRODUODENOSCOPY (EGD) WITH PROPOFOL;  Surgeon: [name], MD;  [facility];  Service: Gastroenterology;   Laterality: N/A;   LAPAROSCOPIC VAGINAL HYSTERECTOMY WITH SALPINGECTOMY Bilateral 04/23/2021   Procedure: LAPAROSCOPIC ASSISTED VAGINAL HYSTERECTOMY WITH BILATERAL  SALPINGECTOMY;  Surgeon: Paula Compton, MD;  Location: [place];  Service: Gynecology;  Laterality: Bilateral;   TONSILLECTOMY Bilateral 2020   TRANSFORAMINAL LUMBAR INTERBODY FUSION (TLIF) WITH PEDICLE SCREW FIXATION 1 LEVEL N/A 05/07/2019   Procedure: Lumbar Five- Sacral One Transforaminal lumbar interbody Fusion;  Surgeon: Erline Levine, MD;  Location: [place];  Service: Neurosurgery;  Laterality: N/A;  Lumbar 5 Sacral 1 Transforaminal lumbar interbody fusion   Patient Active Problem List   Diagnosis Date Noted   Iron deficiency anemia 10/17/2021   Lumbar post-laminectomy syndrome 10/17/2021   Lumbar spondylosis 10/17/2021   S/P laparoscopic assisted vaginal hysterectomy (LAVH) 04/23/2021   Dyspareunia in female 04/11/2021   Dysmenorrhea 04/11/2021   Exocrine pancreatic insufficiency 03/23/2021   Panic attacks 03/20/2020   Status post lumbar spinal fusion 03/20/2020   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   Lumbar foraminal stenosis 05/07/2019   GAD (generalized anxiety disorder) 03/10/2019   OCD (obsessive compulsive disorder) 03/10/2019   Major depression, recurrent, chronic ([place]) 02/08/2019    REFERRING DIAG: M54.2 (ICD-10-CM) - Cervicalgia   THERAPY DIAG:  Cervicalgia  Muscle weakness  Muscle weakness (generalized)  Rationale for Evaluation and Treatment Rehabilitation  PERTINENT HISTORY: Lumbar fusion, post-laminectomy syndrome  PRECAUTIONS: None  SUBJECTIVE:                                                                                                                                                                                      SUBJECTIVE STATEMENT:  Pt presents to PT with reports of increased neck pain and HA. Has been compliant with HEP.   PAIN:  Are you having  pain? Yes Pain location: neck and headache NPRS scale: 2/10 Aggravating factors: light Relieving factors: no relieving factors Pain description:  "really bad tension headache" Stage: Subacute Stability: staying the same 24 hour pattern: better in the morning, early afternoon it gets worse    OBJECTIVE: (objective measures completed at initial evaluation unless otherwise dated)  DIAGNOSTIC FINDINGS:  MRI Cx   IMPRESSION: 1. Moderate left foraminal stenosis at C3-4 secondary to asymmetric left-sided uncovertebral and facet hypertrophy. 2. Mild left foraminal narrowing at C4-5 secondary to uncovertebral and facet hypertrophy. 3. Mild uncovertebral spurring bilaterally at C5-6 and C6-7 without significant stenosis. 4. No focal osseous lesions are present. The spinous process of C7 is likely palpable. 5. 2.4 x 2.4 cm nodule in the left lobe of the thyroid, previously biopsied at [facility]. This has been evaluated on previous imaging. (ref: J Am Coll Radiol. 2015 Feb;12(2): 143-50).   GENERAL OBSERVATION:          Forward head, rounded shoulders, increased kyphosis in sitting                               SENSATION:          Light touch: Appears intact on exam, subjective report of N/T C6 C7           PALPATION: TTP with * pain sub occipitals, palpable nodule mid Cx spine   Cervical ROM   ROM ROM  07/05/2022  Flexion 60  Extension 50  Right lateral flexion 45  Left lateral flexion 30  Right rotation 60*  Left rotation 40*  Flexion rotation (normal is 30 degrees) N  Flexion rotation (normal is 30 degrees) N    (Blank rows = not tested, N = WNL, * = concordant pain)   UPPER EXTREMITY MMT:   MMT Right 07/05/2022 Left 07/05/2022  Shoulder flexion      Shoulder abduction (C5)      Shoulder ER      Shoulder IR      Middle trapezius 3+ 3+  Lower trapezius 3 3  Shoulder extension      Grip strength 40 60  Cervical flexion (C1,C2)      Cervical S/B (  C3)      Shoulder  shrug (C4) c c  Elbow flexion (C6) c c  Elbow ext (C7) c c  Thumb ext (C8) c c  Finger abd (T1) c c  Grossly        (Blank rows = not tested, score listed is out of 5 possible points.  N = WNL, D = diminished, C = clear for gross weakness with myotome testing, * = concordant pain with testing)     JOINT MOBILITY TESTING:  WNL   PATIENT SURVEYS:  FOTO 61 -> 72               TODAY'S TREATMENT 09/02/2022: Therapeutic Exercise: Prone ITY 2x10 2# POE chin tuck x 15 Plank w/ scapular push up 2x10 Median nerve glide 2x10 R Seated low row 2x10 30# Seated high row 2x10 30# Lat pulldown 2x10 30# Manual Therapy: Skilled palpation to identify trigger points for TDN STM to all listed muscles following TDN Positional release to bilateral upper traps Suboccipital release  Trigger Point Dry-Needling:  Treatment instructions: Expect mild to moderate muscle soreness. S/S of pneumothorax if dry needled over a lung field, and to seek immediate medical attention should they occur. Patient verbalized understanding of these instructions and education.   Patient Consent Given: Yes Education handout provided: No Muscles treated: cervical paraspinals, bil sub occipitals, bil UT, L cervical paraspinal Electrical stimulation performed: NO Parameters: N/a Treatment response/outcome: twitch, pain relief               PATIENT EDUCATION:  POC, diagnosis, prognosis, HEP, and outcome measures.  Pt educated via explanation, demonstration, and handout (HEP).  Pt confirms understanding verbally.             HOME EXERCISE PROGRAM: Access Code: [email or web address] URL: [email or web address] Date: 09/02/2022 Prepared by: Octavio Manns  Exercises - Seated Passive Cervical Retraction  - 1 x daily - 7 x weekly - 3 sets - 10 reps - Shoulder External Rotation and Scapular Retraction with Resistance  - 1 x daily - 7 x weekly - 2 sets - 10 reps - red theraband hold - Standing Shoulder Horizontal Abduction with  Resistance  - 1 x daily - 7 x weekly - 2 sets - 10 reps - red theraband hold - Standing Shoulder Row with Anchored Resistance  - 1 x daily - 7 x weekly - 3 sets - 10 reps - green theraband hold - Prone Scapular Retraction Y  - 1 x daily - 7 x weekly - 2-3 sets - 10 reps - Prone T  - 1 x daily - 7 x weekly - 2-3 sets - 10 reps - Median Nerve Tensioner  - 1 x daily - 7 x weekly - 2 sets - 10 reps   ASTERISK SIGNS     Asterisk Signs Eval (07/05/2022) 2/8  2/19         Headaches Constant up to 7/10 No HA in last 7 days   2 days ago; 6/10        Mid trap 3+ bil    4/5        Low trap 3 bil    4/5        L rotation 40    46        FOTO:       56%            ASSESSMENT:   CLINICAL IMPRESSION: Pt able to complete all precribed exercises no adverse effect. Responded well  to TPDN and manual therapy. Exercises continued focused on improving periscapular and DNF strength in order to decrease pain. HEP updated for median nerve glide bias on R UE due to c/o R hand clumsiness. Will continue per POC, assess goals next session.     OBJECTIVE IMPAIRMENTS: Pain, cervical ROM   ACTIVITY LIMITATIONS: Reading, head turns, ADLs   PERSONAL FACTORS: See medical history and pertinent history     GOALS:     SHORT TERM GOALS: Target date: 08/02/2022   Azure will be >75% HEP compliant to improve carryover between sessions and facilitate independent management of condition   Evaluation (07/05/2022): ongoing Goal status: MET     LONG TERM GOALS: Target date: 09/09/2022   Shamiracle will improve FOTO score to 72 as a proxy for functional improvement   Evaluation/Baseline (07/05/2022): 61 08/19/22: 56% Goal status: ONGOING     2.  Wretha will self report >/= 50% decrease in pain from evaluation    Evaluation/Baseline (07/05/2022): 7/10 max pain 08/09/22: 6/10 max Goal status: MET     3.  Renezmae will improve the following MMTs to >/= 4/5 to show improvement in strength:  mid and lower traps     Evaluation/Baseline (07/05/2022): see chart in note 08/19/22: 4/5 Goal status: MET     4.  Caitlin will improve cervical L rotation to >/= 50 degrees   Evaluation/Baseline (07/05/2022): 40 degrees 08/19/22: 46 degrees Goal status: ONGOING     5.  Pascha will improve HA frequency by 75%   Evaluation/Baseline (07/05/2022): constant 08/19/22: 50%  Goal status: ONGOING     PLAN: PT FREQUENCY: 1x/week   PT DURATION: 3 weeks (Ending 09/09/2022)   PLANNED INTERVENTIONS: Therapeutic exercises, Aquatic therapy, Therapeutic activity, Neuro Muscular re-education, Gait training, Patient/Family education, Joint mobilization, Dry Needling, Electrical stimulation, Spinal mobilization and/or manipulation, Moist heat, Taping, Vasopneumatic device, Ionotophoresis '4mg'$ /ml Dexamethasone, and Manual therapy   PLAN FOR NEXT SESSION: cervical and periscapular strengthening, TDN   Ward Chatters, PT 09/02/2022, 9:57 AM

## 2022-09-09 ENCOUNTER — Ambulatory Visit: Payer: 59

## 2022-09-09 DIAGNOSIS — M542 Cervicalgia: Secondary | ICD-10-CM | POA: Diagnosis not present

## 2022-09-09 DIAGNOSIS — M6281 Muscle weakness (generalized): Secondary | ICD-10-CM

## 2022-09-09 NOTE — Therapy (Signed)
OUTPATIENT PHYSICAL THERAPY TREATMENT NOTE/DISCHARGE  PHYSICAL THERAPY DISCHARGE SUMMARY  Visits from Start of Care: 17  Current functional level related to goals / functional outcomes: See goals and objective   Remaining deficits: See goals and objective   Education / Equipment: HEP   Patient agrees to discharge. Patient goals were  mostly met . Patient is being discharged due to meeting the stated rehab goals.   Patient Name: Rebecca Holt MRN: [id number] DOB:04-Oct-1985, 37 y.o., female Today's Date: 09/09/2022  PCP: Karsten Ro, DO  REFERRING PROVIDER: Karsten Ro, DO   END OF SESSION:   PT End of Session - 09/09/22 0913     Visit Number 17    Number of Visits --   1-2x/week   Date for PT Re-Evaluation 09/09/22    Authorization Type Aetna    PT Start Time 0915    PT Stop Time 1000    PT Time Calculation (min) 45 min    Activity Tolerance Patient tolerated treatment well    Behavior During Therapy [facility] for tasks assessed/performed                   Past Medical History:  Diagnosis Date   Anxiety    Arthritis    si joints   Bipolar disorder ([place])    Chicken pox    as child   Depressive disorder 02/08/2019   History of COVID-19 09/09/2020   g i issues x 2-3 weeks all symptoms reolved   History of hypothyroidism    yrs ago per pt on 04-17-2021   Pneumonia    yrs ago per pt on 04-17-2021   Wears glasses    Past Surgical History:  Procedure Laterality Date   ANTERIOR AND POSTERIOR REPAIR N/A 04/23/2021   Procedure: POSTERIOR REPAIR (RECTOCELE);  Surgeon: Paula Compton, MD;  Location: [facility];  Service: Gynecology;  Laterality: N/A;   BIOPSY  07/25/2021   Procedure: BIOPSY;  Surgeon: [name], MD;  [facility];  Service: Gastroenterology;;   COLONOSCOPY WITH PROPOFOL N/A 07/25/2021   Procedure: COLONOSCOPY WITH PROPOFOL;  Surgeon: [name], MD;  Location[facility];  Service: Gastroenterology;   Laterality: N/A;   CYSTOSCOPY N/A 04/23/2021   Procedure: CYSTOSCOPY;  Surgeon: Paula Compton, MD;  Location: [facility];  Service: Gynecology;  Laterality: N/A;   ESOPHAGOGASTRODUODENOSCOPY (EGD) WITH PROPOFOL N/A 07/25/2021   Procedure: ESOPHAGOGASTRODUODENOSCOPY (EGD) WITH PROPOFOL;  Surgeon: [name], MD;  [facility];  Service: Gastroenterology;  Laterality: N/A;   LAPAROSCOPIC VAGINAL HYSTERECTOMY WITH SALPINGECTOMY Bilateral 04/23/2021   Procedure: LAPAROSCOPIC ASSISTED VAGINAL HYSTERECTOMY WITH BILATERAL  SALPINGECTOMY;  Surgeon: Paula Compton, MD;  Location: [place];  Service: Gynecology;  Laterality: Bilateral;   TONSILLECTOMY Bilateral 2020   TRANSFORAMINAL LUMBAR INTERBODY FUSION (TLIF) WITH PEDICLE SCREW FIXATION 1 LEVEL N/A 05/07/2019   Procedure: Lumbar Five- Sacral One Transforaminal lumbar interbody Fusion;  Surgeon: Erline Levine, MD;  Location: [place];  Service: Neurosurgery;  Laterality: N/A;  Lumbar 5 Sacral 1 Transforaminal lumbar interbody fusion   Patient Active Problem List   Diagnosis Date Noted   Iron deficiency anemia 10/17/2021   Lumbar post-laminectomy syndrome 10/17/2021   Lumbar spondylosis 10/17/2021   S/P laparoscopic assisted vaginal hysterectomy (LAVH) 04/23/2021   Dyspareunia in female 04/11/2021   Dysmenorrhea 04/11/2021   Exocrine pancreatic insufficiency 03/23/2021   Panic attacks 03/20/2020   Status post lumbar spinal fusion 03/20/2020   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  Lumbar foraminal stenosis 05/07/2019   GAD (generalized anxiety disorder) 03/10/2019   OCD (obsessive compulsive disorder) 03/10/2019   Major depression, recurrent, chronic ([place]) 02/08/2019    REFERRING DIAG: M54.2 (ICD-10-CM) - Cervicalgia   THERAPY DIAG:  Cervicalgia  Muscle weakness  Rationale for Evaluation and Treatment Rehabilitation  PERTINENT HISTORY: Lumbar fusion, post-laminectomy syndrome      PRECAUTIONS: None  SUBJECTIVE:                                                                                                                                                                                      SUBJECTIVE STATEMENT:  pt presents to PT with continued neck tightness and HA symptoms the previous day. Has continued HEP compliance and notes no adverse effect.    PAIN:  Are you having pain? Yes Pain location: neck and headache NPRS scale: 2/10 Aggravating factors: light Relieving factors: no relieving factors Pain description:  "really bad tension headache" Stage: Subacute Stability: staying the same 24 hour pattern: better in the morning, early afternoon it gets worse    OBJECTIVE: (objective measures completed at initial evaluation unless otherwise dated)  DIAGNOSTIC FINDINGS:  MRI Cx   IMPRESSION: 1. Moderate left foraminal stenosis at C3-4 secondary to asymmetric left-sided uncovertebral and facet hypertrophy. 2. Mild left foraminal narrowing at C4-5 secondary to uncovertebral and facet hypertrophy. 3. Mild uncovertebral spurring bilaterally at C5-6 and C6-7 without significant stenosis. 4. No focal osseous lesions are present. The spinous process of C7 is likely palpable. 5. 2.4 x 2.4 cm nodule in the left lobe of the thyroid, previously biopsied at [facility]. This has been evaluated on previous imaging. (ref: J Am Coll Radiol. 2015 Feb;12(2): 143-50).   GENERAL OBSERVATION:          Forward head, rounded shoulders, increased kyphosis in sitting                               SENSATION:          Light touch: Appears intact on exam, subjective report of N/T C6 C7           PALPATION: TTP with * pain sub occipitals, palpable nodule mid Cx spine   Cervical ROM   ROM ROM  07/05/2022  Flexion 60  Extension 50  Right lateral flexion 45  Left lateral flexion 30  Right rotation 60*  Left rotation 40*  Flexion rotation (normal is 30 degrees) N   Flexion rotation (normal is 30 degrees) N    (Blank rows = not tested, N = WNL, * = concordant pain)   UPPER EXTREMITY  MMT:   MMT Right 07/05/2022 Left 07/05/2022  Shoulder flexion      Shoulder abduction (C5)      Shoulder ER      Shoulder IR      Middle trapezius 3+ 3+  Lower trapezius 3 3  Shoulder extension      Grip strength 40 60  Cervical flexion (C1,C2)      Cervical S/B (C3)      Shoulder shrug (C4) c c  Elbow flexion (C6) c c  Elbow ext (C7) c c  Thumb ext (C8) c c  Finger abd (T1) c c  Grossly        (Blank rows = not tested, score listed is out of 5 possible points.  N = WNL, D = diminished, C = clear for gross weakness with myotome testing, * = concordant pain with testing)     JOINT MOBILITY TESTING:  WNL   PATIENT SURVEYS:  FOTO 61 -> 72               TODAY'S TREATMENT 09/09/2022: Therapeutic Exercise: Prone ITY x 10 2# POE chin tuck x 10 Chin tuck with ball standing x 10 Seated bilateral ER x 15 GTB Seated horizontal abd x 15 GTB Row x 10 black TB Manual Therapy: Skilled palpation to identify trigger points for TDN STM to all listed muscles following TDN Positional release to bilateral upper traps Suboccipital release  Trigger Point Dry-Needling:  Treatment instructions: Expect mild to moderate muscle soreness. S/S of pneumothorax if dry needled over a lung field, and to seek immediate medical attention should they occur. Patient verbalized understanding of these instructions and education.   Patient Consent Given: Yes Education handout provided: No Muscles treated: cervical paraspinals, bil sub occipitals, bil UT, L cervical paraspinal Electrical stimulation performed: NO Parameters: N/a Treatment response/outcome: twitch, pain relief               PATIENT EDUCATION:  POC, diagnosis, prognosis, HEP, and outcome measures.  Pt educated via explanation, demonstration, and handout (HEP).  Pt confirms understanding verbally.             HOME  EXERCISE PROGRAM: Access Code: [email or web address] URL: [email or web address] Date: 09/09/2022 Prepared by: Octavio Manns  Exercises - Standing Isometric Cervical Retraction with Young Berry and Ball at [employer]  - 3-4 x weekly - 2 sets - 10 reps - 5 sec hold - Cervical Retraction Prone on Elbows  - 3-4 x weekly - 2 sets - 10 reps - 5 sec hold - Prone T  - 3-4 x weekly - 2-3 sets - 10 reps - Prone Scapular Retraction Y  - 3-4 x weekly - 2-3 sets - 10 reps - Prone I  - 3-4 x weekly - 2-3 sets - 10 reps - Shoulder External Rotation and Scapular Retraction with Resistance  - 3-4 x weekly - 2 sets - 15 reps - green theraband hold - Standing Shoulder Horizontal Abduction with Resistance  - 3-4 x weekly - 2 sets - 15 reps - greentheraband hold - Standing Shoulder Row with Anchored Resistance  - 3-4 x weekly - 3 sets - 10 reps - blacktheraband hold - Median Nerve Tensioner  - 3-4 x weekly - 2 sets - 10 reps   ASTERISK SIGNS     Asterisk Signs Eval (07/05/2022) 2/8  2/19   3/11      Headaches Constant up to 7/10 No HA in last 7 days   2 days ago; 6/10  1  days; 5/10      Mid trap 3+ bil    4/5  4/5      Low trap 3 bil    4/5  4/5      L rotation 40    46  50      FOTO:       56%  56%          ASSESSMENT:   CLINICAL IMPRESSION: Pt was able to complete all prescribed exercises and demonstrated knowledge of HEP with no adverse effect. Over the course of PT patient has progressed well, noting decrease in neck pain and HA symptoms and slight improvement in cervical rotation. Her FOTO  and pain scale has hit recent plateau but she should continue to improve with HEP compliance and PRN TPDN. Pt in agreement with current plan and is ready to discharge at this time.    OBJECTIVE IMPAIRMENTS: Pain, cervical ROM   ACTIVITY LIMITATIONS: Reading, head turns, ADLs   PERSONAL FACTORS: See medical history and pertinent history     GOALS:     SHORT TERM GOALS: Target date: 08/02/2022   Makella will be  >75% HEP compliant to improve carryover between sessions and facilitate independent management of condition   Evaluation (07/05/2022): ongoing Goal status: MET     LONG TERM GOALS: Target date: 09/09/2022   Bithiah will improve FOTO score to 72 as a proxy for functional improvement   Evaluation/Baseline (07/05/2022): 61 08/19/22: 56% 09/09/22: 56% Goal status: NOT MET     2.  Zeola will self report >/= 50% decrease in pain from evaluation    Evaluation/Baseline (07/05/2022): 7/10 max pain 08/09/22: 6/10 max Goal status: MET     3.  Tayden will improve the following MMTs to >/= 4/5 to show improvement in strength:  mid and lower traps    Evaluation/Baseline (07/05/2022): see chart in note 08/19/22: 4/5 Goal status: MET     4.  Salley will improve cervical L rotation to >/= 50 degrees   Evaluation/Baseline (07/05/2022): 40 degrees 08/19/22: 46 degrees 09/09/22: 50 degrees Goal status: MET     5.  Easton will improve HA frequency by 75%   Evaluation/Baseline (07/05/2022): constant 08/19/22: 50%  09/09/22: 75% Goal status: MET     PLAN: PT FREQUENCY: 1x/week   PT DURATION: 3 weeks (Ending 09/09/2022)   PLANNED INTERVENTIONS: Therapeutic exercises, Aquatic therapy, Therapeutic activity, Neuro Muscular re-education, Gait training, Patient/Family education, Joint mobilization, Dry Needling, Electrical stimulation, Spinal mobilization and/or manipulation, Moist heat, Taping, Vasopneumatic device, Ionotophoresis '4mg'$ /ml Dexamethasone, and Manual therapy   PLAN FOR NEXT SESSION: cervical and periscapular strengthening, TDN   Ward Chatters, PT 09/09/2022, 10:15 AM

## 2022-09-16 ENCOUNTER — Other Ambulatory Visit: Payer: Self-pay | Admitting: Neurology

## 2022-09-16 ENCOUNTER — Other Ambulatory Visit: Payer: Self-pay

## 2022-09-16 DIAGNOSIS — R29898 Other symptoms and signs involving the musculoskeletal system: Secondary | ICD-10-CM

## 2022-09-16 NOTE — Progress Notes (Signed)
Per DR.Jaffe,  I would like to order bilateral upper extremity EMG for this patient (bilateral arm weakness).   EMG order added Bilateral Upper Extremities

## 2022-09-23 ENCOUNTER — Ambulatory Visit: Payer: 59 | Admitting: Neurology

## 2022-09-23 DIAGNOSIS — R29898 Other symptoms and signs involving the musculoskeletal system: Secondary | ICD-10-CM | POA: Diagnosis not present

## 2022-09-23 DIAGNOSIS — G5603 Carpal tunnel syndrome, bilateral upper limbs: Secondary | ICD-10-CM

## 2022-09-23 NOTE — Procedures (Signed)
[facility] Neurology  [place], [address]  [place], [place] [address] Tel: [phone] Fax: [phone] Test Date:  09/23/2022  Patient: Rebecca Holt DOB: [birth year] Physician: Kai Levins, MD  Sex: Female Height: 5\' 7"  Ref Phys: Metta Clines, DO  ID#: [id number]   Technician:    History: This is a 37 year old female with arm weakness.  NCV & EMG Findings: Extensive electrodiagnostic evaluation of bilateral upper limbs shows: Bilateral median sensory responses show prolonged distal peak latency (L3.7, R3.7 ms). Bilateral ulnar and radial sensory responses are within normal limits. Bilateral median (APB) and ulnar (ADM) motor responses are within normal limits. There is no evidence of active or chronic motor axon loss changes affecting any of the tested muscles on needle examination. Motor unit configuration and recruitment pattern is within normal limits.  Impression: This is an abnormal study. The findings are most consistent with the following: Bilateral median mononeuropathy at or distal to the wrist, consistent with carpal tunnel syndrome, mild in degree. No electrodiagnostic evidence of a right or left cervical (C5-T1) motor radiculopathy. Screening studies for bilateral ulnar or radial mononeuropathies are normal.    ___________________________ Kai Levins, MD    Nerve Conduction Studies Motor Nerve Results    Latency Amplitude F-Lat Segment Distance CV Comment  Site (ms) Norm (mV) Norm (ms)  (cm) (m/s) Norm   Left Median (APB) Motor  Wrist 3.0  < 3.9 8.4  > 6.0        Elbow 7.9 - 8.0 -  Elbow-Wrist 27 55  > 50   Right Median (APB) Motor  Wrist 3.7  < 3.9 9.3  > 6.0        Elbow 8.7 - 8.7 -  Elbow-Wrist 27.5 55  > 50   Left Ulnar (ADM) Motor  Wrist 2.8  < 3.1 8.8  > 7.0        Bel elbow 6.7 - 8.1 -  Bel elbow-Wrist 21 54  > 50   Ab elbow 8.7 - 7.9 -  Ab elbow-Bel elbow 10 50 -   Right Ulnar (ADM) Motor  Wrist 2.2  < 3.1 13.3  > 7.0        Bel elbow 6.1  - 12.8 -  Bel elbow-Wrist 21 54  > 50   Ab elbow 8.0 - 12.6 -  Ab elbow-Bel elbow 10 53 -    Sensory Sites    Neg Peak Lat Amplitude (O-P) Segment Distance Velocity Comment  Site (ms) Norm (V) Norm  (cm) (ms)   Left Median Sensory  Wrist-Dig II *3.7  < 3.4 34  > 20 Wrist-Dig II 13    Right Median Sensory  Wrist-Dig II *3.7  < 3.4 38  > 20 Wrist-Dig II 13    Left Radial Sensory  Forearm-Wrist 2.2  < 2.7 50  > 18 Forearm-Wrist 10    Right Radial Sensory  Forearm-Wrist 2.4  < 2.7 34  > 18 Forearm-Wrist 10    Left Ulnar Sensory  Wrist-Dig V 3.0  < 3.1 17  > 12 Wrist-Dig V 11    Right Ulnar Sensory  Wrist-Dig V 2.8  < 3.1 29  > 12 Wrist-Dig V 11     Electromyography   Side Muscle Ins.Act Fibs Fasc Recrt Amp Dur Poly Activation Comment  Left FDI Nml Nml Nml Nml Nml Nml Nml Nml N/A  Left EIP Nml Nml Nml Nml Nml Nml Nml Nml N/A  Left FPL Nml Nml Nml Nml Nml Nml  Nml Nml N/A  Left APB Nml Nml Nml Nml Nml Nml Nml Nml N/A  Left Pronator teres Nml Nml Nml Nml Nml Nml Nml Nml N/A  Left Biceps Nml Nml Nml Nml Nml Nml Nml Nml N/A  Left Triceps Nml Nml Nml Nml Nml Nml Nml Nml N/A  Left Deltoid Nml Nml Nml Nml Nml Nml Nml Nml N/A  Right FDI Nml Nml Nml Nml Nml Nml Nml Nml N/A  Right EIP Nml Nml Nml Nml Nml Nml Nml Nml N/A  Right Pronator teres Nml Nml Nml Nml Nml Nml Nml Nml N/A  Right Triceps Nml Nml Nml Nml Nml Nml Nml Nml N/A  Right Biceps Nml Nml Nml Nml Nml Nml Nml Nml N/A  Right APB Nml Nml Nml Nml Nml Nml Nml Nml N/A  Right Deltoid Nml Nml Nml Nml Nml Nml Nml Nml N/A      Waveforms:  Motor           Sensory
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
## 2022-09-30 ENCOUNTER — Encounter: Payer: Self-pay | Admitting: Neurology

## 2022-09-30 ENCOUNTER — Ambulatory Visit: Payer: 59 | Admitting: Neurology

## 2022-09-30 VITALS — BP 119/75 | HR 93 | Ht 67.0 in | Wt 135.6 lb

## 2022-09-30 DIAGNOSIS — I671 Cerebral aneurysm, nonruptured: Secondary | ICD-10-CM

## 2022-09-30 DIAGNOSIS — G5603 Carpal tunnel syndrome, bilateral upper limbs: Secondary | ICD-10-CM

## 2022-09-30 DIAGNOSIS — G43009 Migraine without aura, not intractable, without status migrainosus: Secondary | ICD-10-CM | POA: Diagnosis not present

## 2022-09-30 DIAGNOSIS — G44229 Chronic tension-type headache, not intractable: Secondary | ICD-10-CM

## 2022-09-30 MED ORDER — RIZATRIPTAN BENZOATE 10 MG PO TBDP: ORAL_TABLET | ORAL | 5 refills | Status: DC

## 2022-09-30 MED ORDER — TOPIRAMATE 25 MG PO TABS: 25.0000 mg | ORAL_TABLET | Freq: Every day | ORAL | 5 refills | Status: DC

## 2022-09-30 NOTE — Patient Instructions (Addendum)
Start topiramate 25mg  at bedtime.  If no improvement in 4 weeks, contact me and we can increase dose Rizatriptan as needed.  Limit use of pain relievers to no more than 2 days out of week to prevent risk of rebound or medication-overuse headache. Wrist splints MRA of head in June Follow up 6 months.

## 2022-10-06 ENCOUNTER — Other Ambulatory Visit: Payer: Self-pay | Admitting: Psychiatry

## 2022-10-08 ENCOUNTER — Telehealth: Payer: Self-pay | Admitting: Neurology

## 2022-10-08 MED ORDER — PREDNISONE 10 MG (21) PO TBPK: ORAL_TABLET | ORAL | 0 refills | Status: DC

## 2022-10-08 NOTE — Telephone Encounter (Signed)
Pt called in stating she has had a headache since Thursday and it won't go away. She has been taking her Topamax. She is wondering if she could try a prednisone taper?

## 2022-10-08 NOTE — Telephone Encounter (Signed)
Sent Rx in and patient advised, thanked me for calling

## 2022-10-08 NOTE — Telephone Encounter (Signed)
Ok to send prednisone taper pack, thanks

## 2022-10-08 NOTE — Telephone Encounter (Signed)
How frequent or the headaches (on average, how many days a week/month are they occurring)? Continued headaches since 10/03/2022 How long do the headaches last? All day since 10/03/2022 Verify what preventative medication and dose you are taking (e.g. topiramate, propranolol, amitriptyline, Emgality, etc) topamax 25mg  one tab po qd Verify which rescue medication you are taking (triptan, Advil, Excedrin, Aleve, Ubrelvy, etc) rizaptan, note has taken it 2 hours after no more than 2 in 24 hours How often are you taking pain relievers/analgesics/rescue mediction? Taken Advil and Aleve prn.Wants to try a prednisone taper pack in. Please advise

## 2022-10-14 ENCOUNTER — Encounter: Payer: Self-pay | Admitting: Neurology

## 2022-10-28 ENCOUNTER — Ambulatory Visit: Payer: 59 | Admitting: Neurology

## 2022-10-28 ENCOUNTER — Telehealth: Payer: 59 | Admitting: Psychiatry

## 2022-11-05 ENCOUNTER — Telehealth: Payer: Self-pay | Admitting: Psychiatry

## 2022-11-05 ENCOUNTER — Encounter: Payer: Self-pay | Admitting: Psychiatry

## 2022-11-05 ENCOUNTER — Ambulatory Visit (INDEPENDENT_AMBULATORY_CARE_PROVIDER_SITE_OTHER): Payer: 59 | Admitting: Psychiatry

## 2022-11-05 DIAGNOSIS — F410 Panic disorder [episodic paroxysmal anxiety] without agoraphobia: Secondary | ICD-10-CM | POA: Diagnosis not present

## 2022-11-05 DIAGNOSIS — G4700 Insomnia, unspecified: Secondary | ICD-10-CM

## 2022-11-05 DIAGNOSIS — R5382 Chronic fatigue, unspecified: Secondary | ICD-10-CM | POA: Diagnosis not present

## 2022-11-05 DIAGNOSIS — F39 Unspecified mood [affective] disorder: Secondary | ICD-10-CM

## 2022-11-05 MED ORDER — AUVELITY 45-105 MG PO TBCR: 1.0000 | EXTENDED_RELEASE_TABLET | Freq: Two times a day (BID) | ORAL | 2 refills | Status: DC

## 2022-11-05 MED ORDER — ALPRAZOLAM 0.5 MG PO TABS: ORAL_TABLET | ORAL | 2 refills | Status: DC

## 2022-11-05 MED ORDER — MODAFINIL 200 MG PO TABS
200.0000 mg | ORAL_TABLET | Freq: Every day | ORAL | 5 refills | Status: DC
Start: 2022-11-05 — End: 2023-05-23

## 2022-11-05 MED ORDER — LAMOTRIGINE 25 MG PO TABS: 75.0000 mg | ORAL_TABLET | Freq: Every day | ORAL | 5 refills | Status: DC

## 2022-11-05 MED ORDER — AUVELITY 45-105 MG PO TBCR: EXTENDED_RELEASE_TABLET | ORAL | 0 refills | Status: DC

## 2022-11-05 NOTE — Telephone Encounter (Signed)
Pharm sent PA request for Auvelity 45-105mg 

## 2022-11-05 NOTE — Progress Notes (Signed)
Rebecca Holt [id number] 05/26/1986 37 y.o.  Subjective:   Patient ID:  Rebecca Holt is a 37 y.o. (DOB Jan 22, 1986) female.  Chief Complaint:  Chief Complaint  Patient presents with   Depression   Anxiety    HPI Rebecca Holt presents to the office today for follow-up of depression, anxiety, and insomnia. Rebecca Holt reports that Rebecca Holt anxiety has been "awful" in response to several family stressors to include son's heath issues and husband having manic s/s after going off medications for treatment of bipolar disorder.   Rebecca Holt reports that Rebecca Holt has been having persistent depression. Rebecca Holt reports having some irritability. Rebecca Holt reports, "borderline panic attack" most of the time. Rebecca Holt reports frequent worry. Rebecca Holt reports poor sleep with multiple middle of the night awakenings. Rebecca Holt reports needing to take Xanax to help with sleep initiation. Estimates sleeping 4-6 hours a night. Rebecca Holt reports that Rebecca Holt has fatigue.Rebecca Holt reports that Rebecca Holt appetite has been ok.  Rebecca Holt reports, "I feel like I am only doing what I need to do for the kids... I'm still doing all I need to do." Rebecca Holt reports difficulty with concentration and focus. Denies impulsive or risky behavior. Denies suicidal intent- "I'm not ever going to act on anything because I have kids."   Son has been having protein in his urine. Son has had some health Issues that have recently improved.   Rebecca Holt was doing some counseling through their church.  Alprazolam last filled 10/08/22 x2. Modafinil last filled 10/07/22.  Past Psychiatric Medication Trials: Ativan- partially effective Tranxene          Wellbutrin XL- Increased activation/agitation with 450 mg. Reports that Wellbutrin seemed to initially be effective.   Zoloft Lexapro Celexa- Had severe discontinuation Pristiq Abilify Rexulti- Cost prohibitive  Olanzapine Risperidone- Involuntary orofacial movements Trileptal Lamictal- Had increased irritability and insomnia with 100 mg dose.  Ambien Modafinil- Helpful  for mood.     AIMS    Flowsheet Row Office Visit from 06/05/2020 in [facility]  AIMS Total Score 0      PHQ2-9    Flowsheet Row Office Visit from 05/07/2022 in [facility] [place] Office Visit from 01/04/2022 in [facility] [place]  PHQ-2 Total Score 2 2  PHQ-9 Total Score 13 14      Flowsheet Row ED from 03/30/2022 in [facility] Admission (Discharged) from 07/25/2021 in [facility] ENDOSCOPY Admission (Discharged) from 04/23/2021 in [facility]  C-SSRS RISK CATEGORY No Risk No Risk No Risk        Review of Systems:  Review of Systems  Musculoskeletal:  Positive for neck pain. Negative for gait problem.  Neurological:  Positive for headaches.       Reports burning sensation  Psychiatric/Behavioral:         Please refer to HPI    Medications: I have reviewed the patient's current medications.  Current Outpatient Medications  Medication Sig Dispense Refill   B Complex-C (B-COMPLEX WITH VITAMIN C) tablet Take 1 tablet by mouth daily.     Dextromethorphan-buPROPion ER (AUVELITY) 45-105 MG TBCR Take 1 tablet daily for 3 days, then increase to 1 tablet twice daily 60 tablet 0   Dextromethorphan-buPROPion ER (AUVELITY) 45-105 MG TBCR Take 1 tablet by mouth 2 (two) times daily. 60 tablet 2   ondansetron (ZOFRAN-ODT) 4 MG disintegrating tablet Take 4 mg by mouth every 8 (eight) hours as needed for vomiting or nausea.  rizatriptan (MAXALT-MLT) 10 MG disintegrating tablet TAKE ONE TABLET BY MOUTH AT ONSET OF HEADACHE; MAY REPEAT ONE TABLET IN 2 HOURS IF NEEDED. MAX OF 2 TABLETS IN 24 HOURS 9 tablet 5   topiramate (TOPAMAX) 25 MG tablet Take 1 tablet (25 mg total) by mouth at bedtime. (Patient taking differently: Take 50 mg by mouth at bedtime.) 30 tablet 5   ZENPEP 40000-126000 units CPEP Take 1-2 capsules by mouth See admin instructions. Take 1  capsule by mouth with each snack & take 2 capsules by mouth with each meal     ALPRAZolam (XANAX) 0.5 MG tablet TAKE ONE TABLET BY MOUTH TWICE A DAY AS NEEDED FOR ANXIETY 45 tablet 2   lamoTRIgine (LAMICTAL) 25 MG tablet Take 3 tablets (75 mg total) by mouth daily. 90 tablet 5   LYSINE PO Take 3 tablets by mouth in the morning. (Patient not taking: Reported on 11/05/2022)     modafinil (PROVIGIL) 200 MG tablet Take 1 tablet (200 mg total) by mouth daily. 30 tablet 5   No current facility-administered medications for this visit.    Medication Side Effects: None  Allergies:  Allergies  Allergen Reactions   Tramadol Itching   Latex Other (See Comments)    Causes irritation.    Past Medical History:  Diagnosis Date   Anxiety    Arthritis    si joints   Bipolar disorder (HCC)    Chicken pox    as child   Depressive disorder 02/08/2019   History of COVID-19 09/09/2020   g i issues x 2-3 weeks all symptoms reolved   History of hypothyroidism    yrs ago per pt on 04-17-2021   Pneumonia    yrs ago per pt on 04-17-2021   Wears glasses     Past Medical History, Surgical history, Social history, and Family history were reviewed and updated as appropriate.   Please see review of systems for further details on the patient's review from today.   Objective:   Physical Exam:  LMP 04/23/2021 (Exact Date)   Physical Exam Constitutional:      General: Rebecca Holt is not in acute distress. Musculoskeletal:        General: No deformity.  Neurological:     Mental Status: Rebecca Holt is alert and oriented to person, place, and time.     Coordination: Coordination normal.  Psychiatric:        Attention and Perception: Attention and perception normal. Rebecca Holt does not perceive auditory or visual hallucinations.        Mood and Affect: Mood is anxious and depressed. Affect is not labile, blunt, angry or inappropriate.        Speech: Speech normal.        Behavior: Behavior normal.        Thought Content:  Thought content normal. Thought content is not paranoid or delusional. Thought content does not include homicidal or suicidal ideation. Thought content does not include homicidal or suicidal plan.        Cognition and Memory: Cognition and memory normal.        Judgment: Judgment normal.     Comments: Insight intact     Lab Review:     Component Value Date/Time   NA 140 07/19/2021 1037   NA 141 02/02/2020 0000   K 3.9 07/19/2021 1037   CL 104 07/19/2021 1037   CO2 28 07/19/2021 1037   GLUCOSE 83 07/19/2021 1037   BUN 8 07/19/2021 1037   BUN 10  02/02/2020 0000   CREATININE 0.75 07/19/2021 1037   CALCIUM 9.3 07/19/2021 1037   PROT 6.9 07/19/2021 1037   ALBUMIN 4.6 07/19/2021 1037   AST 17 07/19/2021 1037   ALT 16 07/19/2021 1037   ALKPHOS 53 07/19/2021 1037   BILITOT 0.4 07/19/2021 1037   GFRNONAA >60 04/24/2021 0145   GFRAA 90.96 02/02/2020 0000       Component Value Date/Time   WBC 8.3 10/10/2021 1245   RBC 4.23 10/10/2021 1245   HGB 13.4 10/10/2021 1245   HCT 39.2 10/10/2021 1245   PLT 265.0 10/10/2021 1245   MCV 92.7 10/10/2021 1245   MCH 29.0 04/24/2021 0145   MCHC 34.1 10/10/2021 1245   RDW 17.4 (H) 10/10/2021 1245   LYMPHSABS 0.9 10/10/2021 1245   MONOABS 0.4 10/10/2021 1245   EOSABS 0.0 10/10/2021 1245   BASOSABS 0.0 10/10/2021 1245    No results found for: "POCLITH", "LITHIUM"   No results found for: "PHENYTOIN", "PHENOBARB", "VALPROATE", "CBMZ"   .res Assessment: Plan:   Discussed potential benefits, risks, and side effects of Auvelity. Will start Auvelity 45-105 mg one tablet daily for 3 days, then increase Auvelity to one tablet twice daily for depression. Advised pt to stop Wellbutrin XL since Auvelity contains Bupropion.  Continue Lamictal 75 mg po qd for mood stabilization.  Continue Modafinil 200 mg po qd for chronic fatigue.  Continue Alprazolam 0.5 mg po BID prn anxiety.  Discussed that it may be beneficial to re-start therapy to help process  recent stressors. Pt to follow-up in 2 months or sooner if clinically indicated.  Patient advised to contact office with any questions, adverse effects, or acute worsening in signs and symptoms.  I spent 30 minutes dedicated to the care of this patient on the date of this  encounter to include pre-visit review of records, face-to-face time with the patient discussing Auvelity, ordering of medication, and post visit documentation.   Rebecca Holt was seen today for depression and anxiety.  Diagnoses and all orders for this visit:  Episodic mood disorder (HCC) -     Dextromethorphan-buPROPion ER (AUVELITY) 45-105 MG TBCR; Take 1 tablet daily for 3 days, then increase to 1 tablet twice daily -     Dextromethorphan-buPROPion ER (AUVELITY) 45-105 MG TBCR; Take 1 tablet by mouth 2 (two) times daily. -     lamoTRIgine (LAMICTAL) 25 MG tablet; Take 3 tablets (75 mg total) by mouth daily.  Panic attacks -     ALPRAZolam (XANAX) 0.5 MG tablet; TAKE ONE TABLET BY MOUTH TWICE A DAY AS NEEDED FOR ANXIETY  Insomnia, unspecified type -     ALPRAZolam (XANAX) 0.5 MG tablet; TAKE ONE TABLET BY MOUTH TWICE A DAY AS NEEDED FOR ANXIETY  Chronic fatigue -     modafinil (PROVIGIL) 200 MG tablet; Take 1 tablet (200 mg total) by mouth daily.     Please see After Visit Summary for patient specific instructions.  Future Appointments  Date Time Provider Department Center  12/14/2022 10:50 AM [facility] MR 1 [id number]  01/06/2023  9:30 AM Corie Chiquito, PMHNP [facility] None  04/01/2023 10:50 AM Drema Dallas, DO [facility] None    No orders of the defined types were placed in this encounter.   -------------------------------

## 2022-11-07 ENCOUNTER — Encounter: Payer: Self-pay | Admitting: Neurology

## 2022-11-13 ENCOUNTER — Encounter: Payer: Self-pay | Admitting: Neurology

## 2022-11-14 ENCOUNTER — Encounter: Payer: Self-pay | Admitting: Neurology

## 2022-11-18 NOTE — Telephone Encounter (Signed)
Pending with [employer] 

## 2022-11-20 ENCOUNTER — Encounter: Payer: Self-pay | Admitting: Neurology

## 2022-11-20 ENCOUNTER — Other Ambulatory Visit: Payer: Self-pay

## 2022-11-20 DIAGNOSIS — G43009 Migraine without aura, not intractable, without status migrainosus: Secondary | ICD-10-CM

## 2022-11-20 MED ORDER — TOPIRAMATE 25 MG PO TABS
75.0000 mg | ORAL_TABLET | Freq: Every day | ORAL | 0 refills | Status: DC
Start: 2022-11-20 — End: 2022-12-16

## 2022-11-20 NOTE — Progress Notes (Signed)
Per Dr.Jaffe, increase topiramate to 75mg  at bedtime.  Please send new prescription if needed.  If no improvement in another 4 weeks, contact us again.   Topiramate 75 mg sent into the pharmacy. Patient advised through Mychart.

## 2022-11-26 ENCOUNTER — Encounter: Payer: Self-pay | Admitting: Neurology

## 2022-12-05 ENCOUNTER — Other Ambulatory Visit: Payer: Self-pay | Admitting: Neurology

## 2022-12-09 ENCOUNTER — Encounter: Payer: Self-pay | Admitting: Neurology

## 2022-12-10 ENCOUNTER — Encounter: Payer: Self-pay | Admitting: Neurology

## 2022-12-11 ENCOUNTER — Encounter: Payer: Self-pay | Admitting: Neurology

## 2022-12-14 ENCOUNTER — Ambulatory Visit
Admission: RE | Admit: 2022-12-14 | Discharge: 2022-12-14 | Disposition: A | Discharge status: Home / Self Care | Payer: 59 | Source: Ambulatory Visit | Attending: Neurology | Admitting: Neurology

## 2022-12-14 DIAGNOSIS — I671 Cerebral aneurysm, nonruptured: Secondary | ICD-10-CM

## 2022-12-15 ENCOUNTER — Other Ambulatory Visit: Payer: Self-pay | Admitting: Neurology

## 2022-12-15 DIAGNOSIS — G43009 Migraine without aura, not intractable, without status migrainosus: Secondary | ICD-10-CM

## 2022-12-18 ENCOUNTER — Other Ambulatory Visit: Payer: Self-pay | Admitting: Psychiatry

## 2022-12-18 DIAGNOSIS — F39 Unspecified mood [affective] disorder: Secondary | ICD-10-CM

## 2022-12-19 ENCOUNTER — Other Ambulatory Visit: Payer: Self-pay | Admitting: Neurology

## 2022-12-19 ENCOUNTER — Encounter: Payer: Self-pay | Admitting: Neurology

## 2022-12-19 ENCOUNTER — Telehealth: Payer: Self-pay

## 2022-12-19 DIAGNOSIS — I671 Cerebral aneurysm, nonruptured: Secondary | ICD-10-CM

## 2022-12-19 NOTE — Telephone Encounter (Signed)
-----   Message from Drema Dallas, DO sent at 12/19/2022  1:07 PM EDT ----- Imaging is stable.  I would still like to repeat a CTA of the head in 6 months to make sure there remains no further changes over the course of a year.

## 2022-12-19 NOTE — Telephone Encounter (Signed)
Per patient she feels the Topiramate increase is not helping.  Patient states she was getting Dry needling I her neck for the neck pain and associated Migraines. But noticed that afterwards she would the pain would go away for  a couple days then return.   Please advise.

## 2022-12-20 ENCOUNTER — Telehealth: Payer: Self-pay

## 2022-12-20 ENCOUNTER — Other Ambulatory Visit: Payer: Self-pay | Admitting: Neurology

## 2022-12-20 DIAGNOSIS — R200 Anesthesia of skin: Secondary | ICD-10-CM

## 2022-12-20 MED ORDER — TOPIRAMATE 50 MG PO TABS
100.0000 mg | ORAL_TABLET | Freq: Every day | ORAL | 5 refills | Status: DC
Start: 2022-12-20 — End: 2023-04-11

## 2022-12-20 MED ORDER — BACLOFEN 10 MG PO TABS: 10.0000 mg | ORAL_TABLET | Freq: Three times a day (TID) | ORAL | 3 refills | Status: DC | PRN

## 2022-12-20 NOTE — Telephone Encounter (Signed)
Advised patient of Dr.jaffe note, I sent to new prescriptions to [facility]: 1. I have increased topiramate to 100mg  at bedtime (take two 50mg  tablets at bedtime) 2. Since there is an obvious muscular component to her neck pain, I have sent in prescription for baclofen 10mg , which she may take up to 3 times daily as needed.  Like all muscle relaxants, caution for possible drowsiness.      Per patient she having numbness and tingling her in her face on the right side. There is also a Sensation that feels like someone is pulling.  Patient has tried Muscle relaxants and they do not work.

## 2022-12-20 NOTE — Telephone Encounter (Signed)
Per Dr.Jaffe, For facial numbness then we can get MRI of brain.

## 2022-12-20 NOTE — Telephone Encounter (Signed)
Patient advised by Mychart message, The numbness and tingling is likely from the topiramate.  If this isn't tolerable, then I would discontinue it (take 2 at bedtime for 3 days, then stop).  If her neck is the primary source of her frequent headaches, then she really needs to see a spine/pain specialist

## 2023-01-06 ENCOUNTER — Ambulatory Visit: Payer: 59 | Admitting: Psychiatry

## 2023-01-13 ENCOUNTER — Encounter: Payer: Self-pay | Admitting: Neurology

## 2023-01-14 ENCOUNTER — Telehealth: Payer: Self-pay

## 2023-01-14 NOTE — Telephone Encounter (Signed)
Eff Dates: 01/14/23-01/12/2025 [facility]

## 2023-01-14 NOTE — Telephone Encounter (Signed)
Prior Authorization initiated with RX Benefits ID# [id number] for Auvelity 45-105 mg #60, pending EOC# [id number]

## 2023-01-14 NOTE — Telephone Encounter (Signed)
Prior Approval received from RX Benefits effective 01/14/2023 for Auvelity 45-105 mg #60/30 day

## 2023-01-16 ENCOUNTER — Other Ambulatory Visit: Payer: Self-pay | Admitting: Obstetrics and Gynecology

## 2023-01-16 DIAGNOSIS — N6310 Unspecified lump in the right breast, unspecified quadrant: Secondary | ICD-10-CM

## 2023-01-20 ENCOUNTER — Ambulatory Visit (INDEPENDENT_AMBULATORY_CARE_PROVIDER_SITE_OTHER): Payer: 59 | Admitting: Psychiatry

## 2023-01-20 ENCOUNTER — Encounter: Payer: Self-pay | Admitting: Psychiatry

## 2023-01-20 VITALS — BP 114/77 | HR 73

## 2023-01-20 DIAGNOSIS — G4719 Other hypersomnia: Secondary | ICD-10-CM | POA: Diagnosis not present

## 2023-01-20 DIAGNOSIS — F39 Unspecified mood [affective] disorder: Secondary | ICD-10-CM

## 2023-01-20 DIAGNOSIS — G4700 Insomnia, unspecified: Secondary | ICD-10-CM | POA: Diagnosis not present

## 2023-01-20 DIAGNOSIS — F410 Panic disorder [episodic paroxysmal anxiety] without agoraphobia: Secondary | ICD-10-CM | POA: Diagnosis not present

## 2023-01-20 MED ORDER — LAMOTRIGINE 25 MG PO TABS
75.0000 mg | ORAL_TABLET | Freq: Every day | ORAL | 5 refills | Status: AC
Start: 2023-01-20 — End: ?

## 2023-01-20 MED ORDER — AUVELITY 45-105 MG PO TBCR
1.0000 | EXTENDED_RELEASE_TABLET | Freq: Two times a day (BID) | ORAL | 5 refills | Status: AC
Start: 2023-01-20 — End: ?

## 2023-01-20 MED ORDER — ALPRAZOLAM 0.5 MG PO TABS
ORAL_TABLET | ORAL | 5 refills | Status: AC
Start: 2023-02-17 — End: ?

## 2023-01-20 NOTE — Progress Notes (Unsigned)
Rebecca Holt [id number] [birth year] 37 y.o.  Subjective:   Patient ID:  Rebecca Holt is a 37 y.o. (DOB Nov 11, 1985) female.  Chief Complaint: No chief complaint on file.   HPI Rebecca Holt presents to the office today for follow-up of depression, anxiety, and insomnia. "It's seems to work really well" and reports that Rebecca Holt has been helpful for her mood. She has been playing her guitar more and wanting to do it, especially after injections for carpal tunnel. She reports that has some depression- "but it's a lot better than it was." She reports that she has not had episodes of acute anxiety with physical symptoms and was previously having this "constantly." Some worry and anxious thoughts. She reports poor sleep with multiple awakenings. Difficulty falling asleep some nights. Typically sleeping around 5 hours. She reports that she limits caffeine and screen time later in the day. Appetite is fair. She reports that her weight is stable. She reports difficulty with concentration and that there have been more distractions. She reports that she has had fatigue with a new medication for mouth ulcers. She reports that her energy was ok. Motivation has been ok, aside from being tired. Denies SI.   Children are now 55 yo, 97 yo, and 70 yo. Son's health issue resolved.   Modafinil last filled 12/17/22.  Alprazolam last filled 12/17/22 x 2.  Past Psychiatric Medication Trials: Ativan- partially effective Tranxene          Wellbutrin XL- Increased activation/agitation with 450 mg. Reports that Wellbutrin seemed to initially be effective.   Zoloft Lexapro Celexa- Had severe discontinuation Pristiq Abilify Rexulti- Cost prohibitive  Olanzapine Risperidone- Involuntary orofacial movements Trileptal Lamictal- Had increased irritability and insomnia with 100 mg dose.  Ambien Modafinil- Helpful for mood.      AIMS    Flowsheet Row Office Visit from 06/05/2020 in [facility]  AIMS Total Score 0      PHQ2-9    Flowsheet Row Office Visit from 05/07/2022 in [facility] [place] Office Visit from 01/04/2022 in [facility] [place]  PHQ-2 Total Score 2 2  PHQ-9 Total Score 13 14      Flowsheet Row ED from 03/30/2022 in [facility] Admission (Discharged) from 07/25/2021 in [facility] Admission (Discharged) from 04/23/2021 in [facility]  C-SSRS RISK CATEGORY No Risk No Risk No Risk        Review of Systems:  Review of Systems  Musculoskeletal:  Negative for gait problem.       Some improvement in joint pain with new medication  Neurological:  Negative for tremors.  Psychiatric/Behavioral:         Please refer to HPI    Medications: I have reviewed the patient's current medications.  Current Outpatient Medications  Medication Sig Dispense Refill   baclofen (LIORESAL) 10 MG tablet Take 1 tablet (10 mg total) by mouth 3 (three) times daily as needed for muscle spasms. 90 each 3   topiramate (TOPAMAX) 50 MG tablet Take 2 tablets (100 mg total) by mouth at bedtime. 60 tablet 5   ALPRAZolam (XANAX) 0.5 MG tablet TAKE ONE TABLET BY MOUTH TWICE A DAY AS NEEDED FOR ANXIETY 45 tablet 2   B Complex-C (B-COMPLEX WITH VITAMIN C) tablet Take 1 tablet by mouth daily.     Dextromethorphan-buPROPion ER (AUVELITY) 45-105 MG TBCR Take 1 tablet daily for 3 days, then increase to 1 tablet twice  daily 60 tablet 0   Dextromethorphan-buPROPion ER (AUVELITY) 45-105 MG TBCR Take 1 tablet by mouth 2 (two) times daily. 60 tablet 2   lamoTRIgine (LAMICTAL) 25 MG tablet Take 3 tablets (75 mg total) by mouth daily. 90 tablet 5   LYSINE PO Take 3 tablets by mouth in the morning. (Patient not taking: Reported on 11/05/2022)     modafinil (PROVIGIL) 200 MG tablet Take 1 tablet (200 mg total) by mouth daily. 30 tablet 5   ondansetron (ZOFRAN-ODT) 4 MG disintegrating  tablet Take 4 mg by mouth every 8 (eight) hours as needed for vomiting or nausea.     rizatriptan (MAXALT-MLT) 10 MG disintegrating tablet TAKE ONE TABLET BY MOUTH AT ONSET OF HEADACHE; MAY REPEAT ONE TABLET IN 2 HOURS IF NEEDED. MAX OF 2 TABLETS IN 24 HOURS 9 tablet 5   ZENPEP 40000-126000 units CPEP Take 1-2 capsules by mouth See admin instructions. Take 1 capsule by mouth with each snack & take 2 capsules by mouth with each meal     No current facility-administered medications for this visit.    Medication Side Effects: None  Allergies:  Allergies  Allergen Reactions   Tramadol Itching   Latex Other (See Comments)    Causes irritation.    Past Medical History:  Diagnosis Date   Anxiety    Arthritis    si joints   Bipolar disorder (HCC)    Chicken pox    as child   Depressive disorder 02/08/2019   History of COVID-19 09/09/2020   g i issues x 2-3 weeks all symptoms reolved   History of hypothyroidism    yrs ago per pt on 04-17-2021   Pneumonia    yrs ago per pt on 04-17-2021   Wears glasses     Past Medical History, Surgical history, Social history, and Family history were reviewed and updated as appropriate.   Please see review of systems for further details on the patient's review from today.   Objective:   Physical Exam:  LMP 04/23/2021 (Exact Date)   Physical Exam  Lab Review:     Component Value Date/Time   NA 140 07/19/2021 1037   NA 141 02/02/2020 0000   K 3.9 07/19/2021 1037   CL 104 07/19/2021 1037   CO2 28 07/19/2021 1037   GLUCOSE 83 07/19/2021 1037   BUN 8 07/19/2021 1037   BUN 10 02/02/2020 0000   CREATININE 0.75 07/19/2021 1037   CALCIUM 9.3 07/19/2021 1037   PROT 6.9 07/19/2021 1037   ALBUMIN 4.6 07/19/2021 1037   AST 17 07/19/2021 1037   ALT 16 07/19/2021 1037   ALKPHOS 53 07/19/2021 1037   BILITOT 0.4 07/19/2021 1037   GFRNONAA >60 04/24/2021 0145   GFRAA 90.96 02/02/2020 0000       Component Value Date/Time   WBC 8.3  10/10/2021 1245   RBC 4.23 10/10/2021 1245   HGB 13.4 10/10/2021 1245   HCT 39.2 10/10/2021 1245   PLT 265.0 10/10/2021 1245   MCV 92.7 10/10/2021 1245   MCH 29.0 04/24/2021 0145   MCHC 34.1 10/10/2021 1245   RDW 17.4 (H) 10/10/2021 1245   LYMPHSABS 0.9 10/10/2021 1245   MONOABS 0.4 10/10/2021 1245   EOSABS 0.0 10/10/2021 1245   BASOSABS 0.0 10/10/2021 1245    No results found for: "POCLITH", "LITHIUM"   No results found for: "PHENYTOIN", "PHENOBARB", "VALPROATE", "CBMZ"   .res Assessment: Plan:    There are no diagnoses linked to this encounter.   Please  see After Visit Summary for patient specific instructions.  Future Appointments  Date Time Provider Department Center  01/20/2023 11:30 AM Corie Chiquito, PMHNP CP-CP None  01/29/2023  8:10 AM [facility] DIAG [place] 3 [facility]  01/29/2023  8:20 AM [facility] [place] 3 GI-BCG[facility]  02/27/2023 10:50 AM GI-315 MR 3 GI-315MRI GI-[facility] [facility]  04/01/2023 10:50 AM Drema Dallas, DO [facility] None    No orders of the defined types were placed in this encounter.   -------------------------------

## 2023-01-29 ENCOUNTER — Other Ambulatory Visit: Payer: 59

## 2023-01-29 ENCOUNTER — Ambulatory Visit
Admission: RE | Admit: 2023-01-29 | Discharge: 2023-01-29 | Disposition: A | Discharge status: Home / Self Care | Payer: 59 | Source: Ambulatory Visit | Attending: Obstetrics and Gynecology | Admitting: Obstetrics and Gynecology

## 2023-01-29 ENCOUNTER — Ambulatory Visit: Payer: 59

## 2023-01-29 DIAGNOSIS — N6310 Unspecified lump in the right breast, unspecified quadrant: Secondary | ICD-10-CM

## 2023-02-27 ENCOUNTER — Ambulatory Visit
Admission: RE | Admit: 2023-02-27 | Discharge: 2023-02-27 | Disposition: A | Discharge status: Home / Self Care | Payer: 59 | Source: Ambulatory Visit | Attending: Neurology | Admitting: Neurology

## 2023-02-27 DIAGNOSIS — R200 Anesthesia of skin: Secondary | ICD-10-CM

## 2023-02-27 MED ORDER — GADOPICLENOL 0.5 MMOL/ML IV SOLN
6.0000 mL | Freq: Once | INTRAVENOUS | Status: AC | PRN
  Administered 2023-02-27: 6 mL via INTRAVENOUS

## 2023-03-07 ENCOUNTER — Telehealth: Payer: Self-pay | Admitting: Psychiatry

## 2023-03-07 ENCOUNTER — Encounter: Payer: Self-pay | Admitting: Neurology

## 2023-03-07 NOTE — Telephone Encounter (Signed)
PA request received from [facility] for Auvelity 45-105mg  ER tabs

## 2023-03-10 ENCOUNTER — Telehealth: Payer: Self-pay

## 2023-03-10 ENCOUNTER — Telehealth: Payer: Self-pay | Admitting: Psychiatry

## 2023-03-10 DIAGNOSIS — R9082 White matter disease, unspecified: Secondary | ICD-10-CM

## 2023-03-10 NOTE — Telephone Encounter (Signed)
-----   Message from Cira Servant sent at 03/09/2023  7:55 PM EDT ----- MRI of brain shows nonspecific changes in the brain but we often see this in people with history of migraines.  I would like to check some autoimmune labs:  ANA with ENA panel and sed rate

## 2023-03-10 NOTE — Telephone Encounter (Signed)
Raphaella called at 9:15 to report that she has been out of Auvelity since Thursday because it needs a PA.  She is asking for samples to tie her over until she can get it filled.  Please let her know if we have any that she can have.

## 2023-03-10 NOTE — Telephone Encounter (Signed)
PA had been approved. Cost was still $1100+. Provided co-pay card info. Pharmacy will need to order it. Patient notified.

## 2023-03-11 ENCOUNTER — Other Ambulatory Visit (INDEPENDENT_AMBULATORY_CARE_PROVIDER_SITE_OTHER): Payer: 59

## 2023-03-11 DIAGNOSIS — R9082 White matter disease, unspecified: Secondary | ICD-10-CM

## 2023-03-11 LAB — SEDIMENTATION RATE: Sed Rate: 1 mm/h (ref 0–20)

## 2023-03-15 LAB — ANA+ENA+DNA/DS+SCL 70+SJOSSA/B
ANA Titer 1: NEGATIVE
ENA RNP Ab: 0.3 AI (ref 0.0–0.9)
ENA SM Ab Ser-aCnc: <0.2 AI (ref 0.0–0.9)
ENA SSA (RO) Ab: <0.2 AI (ref 0.0–0.9)
ENA SSB (LA) Ab: <0.2 AI (ref 0.0–0.9)
Scleroderma (Scl-70) (ENA) Antibody, IgG: <0.2 AI (ref 0.0–0.9)
dsDNA Ab: 1 [IU]/mL (ref 0–9)
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
## 2023-04-01 ENCOUNTER — Telehealth: Payer: Self-pay

## 2023-04-01 ENCOUNTER — Encounter: Payer: Self-pay | Admitting: Neurology

## 2023-04-01 ENCOUNTER — Ambulatory Visit: Payer: 59 | Admitting: Neurology

## 2023-04-01 VITALS — BP 117/81 | HR 80 | Ht 67.0 in | Wt 141.2 lb

## 2023-04-01 DIAGNOSIS — G43E11 Chronic migraine with aura, intractable, with status migrainosus: Secondary | ICD-10-CM

## 2023-04-01 DIAGNOSIS — M47812 Spondylosis without myelopathy or radiculopathy, cervical region: Secondary | ICD-10-CM | POA: Diagnosis not present

## 2023-04-01 DIAGNOSIS — G5603 Carpal tunnel syndrome, bilateral upper limbs: Secondary | ICD-10-CM

## 2023-04-01 DIAGNOSIS — R9082 White matter disease, unspecified: Secondary | ICD-10-CM

## 2023-04-01 NOTE — Patient Instructions (Signed)
Will start Botox Taper off topiramate - take 1 tablet at bedtime for one week, then 1/2 tablet at bedtime for a week, then STOP Continue rizatriptan for episodic migraines and baclofen as needed for flare up of persistent headaches Follow up for Botox

## 2023-04-01 NOTE — Telephone Encounter (Signed)
Patient seen in office today,Per Dr.Jaffe patient to start Botox.    PA team please start PA for Botox 200 units.

## 2023-04-09 ENCOUNTER — Telehealth: Payer: Self-pay | Admitting: Pharmacy Technician

## 2023-04-09 ENCOUNTER — Encounter: Payer: Self-pay | Admitting: Neurology

## 2023-04-09 ENCOUNTER — Other Ambulatory Visit (HOSPITAL_COMMUNITY): Payer: Self-pay

## 2023-04-09 NOTE — Telephone Encounter (Signed)
[employer] verification has been submitted. Benefit Verification:  [id number]  Pharmacy PA has been submitted for BOTOX 200 via CMM. INSURANCE:  DATE SUBMITTED: 10.9.24 KEY: [id number] Status is pending

## 2023-04-10 ENCOUNTER — Other Ambulatory Visit: Payer: Self-pay

## 2023-04-10 ENCOUNTER — Other Ambulatory Visit (HOSPITAL_COMMUNITY): Payer: Self-pay

## 2023-04-10 MED ORDER — ONABOTULINUMTOXINA 200 UNITS IJ SOLR
INTRAMUSCULAR | 4 refills | Status: DC
Start: 2023-04-10 — End: 2023-11-26
  Filled 2023-04-10: qty 1, fill #0
  Filled 2023-04-11: qty 1, 90d supply, fill #0
  Filled 2023-07-15: qty 1, 90d supply, fill #1
  Filled 2023-10-06: qty 1, 90d supply, fill #2

## 2023-04-10 NOTE — Telephone Encounter (Signed)
Patient advised, Script sent to [facility], Appt scheduled for 04/18/23

## 2023-04-10 NOTE — Telephone Encounter (Signed)
Pharmacy Patient Advocate Encounter- Botox BIV-Pharmacy Benefit:  PA was submitted to [facility] and has been approved through: 10.10.24 TO 4.9.25 Authorization# [id number]  Please send prescription to Specialty Pharmacy: [facility]: [id number]  Estimated Copay is: 0  Patient IS eligible for Botox Copay Card, which will make patient's copay as little as zero. Copay card will be provided to pharmacy.

## 2023-04-11 ENCOUNTER — Other Ambulatory Visit (HOSPITAL_COMMUNITY): Payer: Self-pay

## 2023-04-11 ENCOUNTER — Ambulatory Visit: Payer: 59 | Admitting: Adult Health

## 2023-04-11 ENCOUNTER — Other Ambulatory Visit: Payer: Self-pay

## 2023-04-11 VITALS — BP 118/78 | HR 83 | Temp 99.0°F | Wt 142.8 lb

## 2023-04-11 DIAGNOSIS — J0140 Acute pansinusitis, unspecified: Secondary | ICD-10-CM

## 2023-04-11 DIAGNOSIS — R059 Cough, unspecified: Secondary | ICD-10-CM | POA: Diagnosis not present

## 2023-04-11 DIAGNOSIS — J029 Acute pharyngitis, unspecified: Secondary | ICD-10-CM

## 2023-04-11 LAB — POCT RAPID STREP A (OFFICE): Rapid Strep A Screen: NEGATIVE

## 2023-04-11 LAB — POC COVID19 BINAXNOW: SARS Coronavirus 2 Ag: NEGATIVE

## 2023-04-11 LAB — POCT INFLUENZA A/B
Influenza A, POC: NEGATIVE
Influenza B, POC: NEGATIVE

## 2023-04-11 MED ORDER — FLUCONAZOLE 150 MG PO TABS
150.0000 mg | ORAL_TABLET | Freq: Once | ORAL | 1 refills | Status: AC
Start: 2023-04-11 — End: 2023-04-11

## 2023-04-11 MED ORDER — AMOXICILLIN 500 MG PO CAPS
500.0000 mg | ORAL_CAPSULE | Freq: Two times a day (BID) | ORAL | 0 refills | Status: AC
Start: 2023-04-11 — End: 2023-04-21

## 2023-04-11 NOTE — Progress Notes (Signed)
Specialty Pharmacy Initial Fill Coordination Note  Rebecca Holt is a 37 y.o. female contacted today regarding refills of specialty medication(s) Onabotulinumtoxina   Patient requested Courier to Provider Office   Delivery date: 04/15/23   Verified address: [address], [place], , [address]   Medication will be filled on 10.13.24.   Patient is aware of 0 copayment.

## 2023-04-11 NOTE — Progress Notes (Signed)
Subjective:    Patient ID: Rebecca Holt, female    DOB: 1986-02-26, 37 y.o.   MRN: [id number]  URI  This is a new problem. The current episode started in the past 7 days. The maximum temperature recorded prior to her arrival was 100.4 - 100.9 F. The fever has been present for 1 to 2 days. Associated symptoms include congestion, coughing, headaches, neck pain, rhinorrhea, sinus pain and a sore throat. Pertinent negatives include no chest pain, diarrhea, ear pain, plugged ear sensation, swollen glands or wheezing. She has tried decongestant and NSAIDs for the symptoms. The treatment provided mild relief.      Review of Systems  HENT:  Positive for congestion, rhinorrhea, sinus pain and sore throat. Negative for ear pain.   Respiratory:  Positive for cough. Negative for wheezing.   Cardiovascular:  Negative for chest pain.  Gastrointestinal:  Negative for diarrhea.  Musculoskeletal:  Positive for neck pain.  Neurological:  Positive for headaches.   Past Medical History:  Diagnosis Date   Anxiety    Arthritis    si joints   Bipolar disorder (HCC)    Chicken pox    as child   Depressive disorder 02/08/2019   History of COVID-19 09/09/2020   g i issues x 2-3 weeks all symptoms reolved   History of hypothyroidism    yrs ago per pt on 04-17-2021   Pneumonia    yrs ago per pt on 04-17-2021   Wears glasses     Social History   Socioeconomic History   Marital status: Married    Spouse name: Not on file   Number of children: Not on file   Years of education: Not on file   Highest education level: 12th grade  Occupational History   Not on file  Tobacco Use   Smoking status: Never   Smokeless tobacco: Never  Vaping Use   Vaping status: Never Used  Substance and Sexual Activity   Alcohol use: No   Drug use: No   Sexual activity: Yes    Birth control/protection: None  Other Topics Concern   Not on file  Social History Narrative   Right handed   Drinks caffeine   Social  Determinants of Health   Financial Resource Strain: Patient Declined (04/11/2023)   Overall Financial Resource Strain (CARDIA)    Difficulty of Paying Living Expenses: Patient declined  Food Insecurity: No Food Insecurity (04/11/2023)   Hunger Vital Sign    Worried About Running Out of Food in the Last Year: Never true    Ran Out of Food in the Last Year: Never true  Transportation Needs: No Transportation Needs (04/11/2023)   PRAPARE - Administrator, Civil Service (Medical): No    Lack of Transportation (Non-Medical): No  Physical Activity: Insufficiently Active (04/11/2023)   Exercise Vital Sign    Days of Exercise per Week: 3 days    Minutes of Exercise per Session: 20 min  Stress: Patient Declined (04/11/2023)   [employer] of Occupational Health - Occupational Stress Questionnaire    Feeling of Stress : Patient declined  Social Connections: Socially Integrated (04/11/2023)   Social Connection and Isolation Panel [NHANES]    Frequency of Communication with Friends and Family: More than three times a week    Frequency of Social Gatherings with Friends and Family: More than three times a week    Attends Religious Services: More than 4 times per year    Active Member of Clubs or  Organizations: Yes    Attends Engineer, structural: More than 4 times per year    Marital Status: Married  Catering manager Violence: Not on file    Past Surgical History:  Procedure Laterality Date   ANTERIOR AND POSTERIOR REPAIR N/A 04/23/2021   Procedure: POSTERIOR REPAIR (RECTOCELE);  Surgeon: Huel Cote, MD;  Location: [facility];  Service: Gynecology;  Laterality: N/A;   BIOPSY  07/25/2021   Procedure: BIOPSY;  Surgeon: Kerin Salen, MD;  Location: [facility];  Service: Gastroenterology;;   COLONOSCOPY WITH PROPOFOL N/A 07/25/2021   Procedure: COLONOSCOPY WITH PROPOFOL;  Surgeon: Kerin Salen, MD;  Location: [facility];  Service: Gastroenterology;   Laterality: N/A;   CYSTOSCOPY N/A 04/23/2021   Procedure: CYSTOSCOPY;  Surgeon: Huel Cote, MD;  Location: [facility];  Service: Gynecology;  Laterality: N/A;   ESOPHAGOGASTRODUODENOSCOPY (EGD) WITH PROPOFOL N/A 07/25/2021   Procedure: ESOPHAGOGASTRODUODENOSCOPY (EGD) WITH PROPOFOL;  Surgeon: Kerin Salen, MD;  Location: [facility];  Service: Gastroenterology;  Laterality: N/A;   LAPAROSCOPIC VAGINAL HYSTERECTOMY WITH SALPINGECTOMY Bilateral 04/23/2021   Procedure: LAPAROSCOPIC ASSISTED VAGINAL HYSTERECTOMY WITH BILATERAL  SALPINGECTOMY;  Surgeon: Huel Cote, MD;  Location: [facility];  Service: Gynecology;  Laterality: Bilateral;   TONSILLECTOMY Bilateral 2020   TRANSFORAMINAL LUMBAR INTERBODY FUSION (TLIF) WITH PEDICLE SCREW FIXATION 1 LEVEL N/A 05/07/2019   Procedure: Lumbar Five- Sacral One Transforaminal lumbar interbody Fusion;  Surgeon: Maeola Harman, MD;  Location: [facility];  Service: Neurosurgery;  Laterality: N/A;  Lumbar 5 Sacral 1 Transforaminal lumbar interbody fusion    Family History  Problem Relation Age of Onset   Bipolar disorder Mother    Mental illness Mother        bipolar   Diabetic kidney disease Father    Hyperlipidemia Father    Diabetes Father    Thyroid cancer Maternal Grandmother    Cancer Maternal Grandfather        leukemia   Cancer Paternal Grandfather        colon   Diabetes Paternal Grandfather    Bipolar disorder Son    Anesthesia problems Neg Hx    Hypotension Neg Hx    Malignant hyperthermia Neg Hx    Pseudochol deficiency Neg Hx     Allergies  Allergen Reactions   Tramadol Itching   Latex Other (See Comments)    Causes irritation.    Current Outpatient Medications on File Prior to Visit  Medication Sig Dispense Refill   ALPRAZolam (XANAX) 0.5 MG tablet TAKE ONE TABLET BY MOUTH TWICE A DAY AS NEEDED FOR ANXIETY 45 tablet 5   azaTHIOprine (IMURAN) 50 MG tablet Take 50 mg by mouth daily.     B  Complex-C (B-COMPLEX WITH VITAMIN C) tablet Take 1 tablet by mouth daily.     baclofen (LIORESAL) 10 MG tablet Take 1 tablet (10 mg total) by mouth 3 (three) times daily as needed for muscle spasms. 90 each 3   botulinum toxin Type A (BOTOX) 200 units injection Inject 155 units IM into multiple site in the face,neck and head once every 90 days 1 each 4   Dextromethorphan-buPROPion ER (AUVELITY) 45-105 MG TBCR Take 1 tablet daily for 3 days, then increase to 1 tablet twice daily 60 tablet 0   Dextromethorphan-buPROPion ER (AUVELITY) 45-105 MG TBCR Take 1 tablet by mouth 2 (two) times daily. 60 tablet 5   lamoTRIgine (LAMICTAL) 25 MG tablet Take 3 tablets (75 mg total) by mouth daily. 90 tablet 5  ondansetron (ZOFRAN-ODT) 4 MG disintegrating tablet Take 4 mg by mouth every 8 (eight) hours as needed for vomiting or nausea.     rizatriptan (MAXALT-MLT) 10 MG disintegrating tablet TAKE ONE TABLET BY MOUTH AT ONSET OF HEADACHE; MAY REPEAT ONE TABLET IN 2 HOURS IF NEEDED. MAX OF 2 TABLETS IN 24 HOURS 9 tablet 5   ZENPEP 40000-126000 units CPEP Take 1-2 capsules by mouth See admin instructions. Take 1 capsule by mouth with each snack & take 2 capsules by mouth with each meal     modafinil (PROVIGIL) 200 MG tablet Take 1 tablet (200 mg total) by mouth daily. 30 tablet 5   No current facility-administered medications on file prior to visit.    BP 118/78 (BP Location: Left Arm, Patient Position: Sitting, Cuff Size: Normal)   Pulse 83   Temp 99 F (37.2 C) (Oral)   Wt 142 lb 12.8 oz (64.8 kg)   LMP 04/23/2021 (Exact Date)   SpO2 99%   BMI 22.37 kg/m       Objective:   Physical Exam Vitals and nursing note reviewed.  Constitutional:      Appearance: She is ill-appearing.  HENT:     Nose: Congestion and rhinorrhea present. Rhinorrhea is purulent.     Right Turbinates: Enlarged and swollen.     Left Turbinates: Enlarged and swollen.     Right Sinus: Maxillary sinus tenderness and frontal sinus  tenderness present.     Left Sinus: Maxillary sinus tenderness and frontal sinus tenderness present.     Mouth/Throat:     Mouth: Mucous membranes are moist.     Tongue: No lesions.     Pharynx: Oropharynx is clear.  Cardiovascular:     Rate and Rhythm: Normal rate and regular rhythm.     Pulses: Normal pulses.     Heart sounds: Normal heart sounds.  Pulmonary:     Effort: Pulmonary effort is normal.     Breath sounds: Normal breath sounds.  Musculoskeletal:        General: Normal range of motion.  Lymphadenopathy:     Head:     Right side of head: Submandibular and tonsillar adenopathy present.     Left side of head: Submandibular and tonsillar adenopathy present.  Skin:    General: Skin is warm and moist.     Capillary Refill: Capillary refill takes less than 2 seconds.  Neurological:     General: No focal deficit present.     Mental Status: She is oriented to person, place, and time.  Psychiatric:        Mood and Affect: Mood normal.        Behavior: Behavior normal.        Thought Content: Thought content normal.        Judgment: Judgment normal.       Assessment & Plan:  1. Cough, unspecified type  - POC COVID-19- negative - POCT Influenza A/B- negative  2. Sore throat  - POC Rapid Strep A- negative  3. Acute non-recurrent pansinusitis - She appears acutely ill. Will treat sinusitis with amoxicillin. Can continue with Nsaids and tylenol. Stay hydrated and rest. Follow up if not feeling any better over the next 2-3 days  - amoxicillin (AMOXIL) 500 MG capsule; Take 1 capsule (500 mg total) by mouth 2 (two) times daily for 10 days.  Dispense: 20 capsule; Refill: 0 - fluconazole (DIFLUCAN) 150 MG tablet; Take 1 tablet (150 mg total) by mouth once for 1 dose.  Dispense: 1 tablet; Refill: 1  Shirline Frees, NP

## 2023-04-14 ENCOUNTER — Other Ambulatory Visit: Payer: Self-pay

## 2023-04-18 ENCOUNTER — Ambulatory Visit: Payer: 59 | Admitting: Neurology

## 2023-04-18 DIAGNOSIS — G43E11 Chronic migraine with aura, intractable, with status migrainosus: Secondary | ICD-10-CM | POA: Diagnosis not present

## 2023-04-18 MED ORDER — ONABOTULINUMTOXINA 100 UNITS IJ SOLR
200.0000 [IU] | Freq: Once | INTRAMUSCULAR | Status: AC
  Administered 2023-04-18: 155 [IU] via INTRAMUSCULAR

## 2023-04-18 NOTE — Progress Notes (Signed)
[facility]  ° °Procedure Note Botox ° °Attending: Dr. Keshawn Sundberg ° °Preoperative Diagnosis(es): Chronic migraine ° °Consent obtained from: The patient °Benefits discussed included, but were not limited to decreased muscle tightness, increased joint range of motion, and decreased pain.  Risk discussed included, but were not limited pain and discomfort, bleeding, bruising, excessive weakness, venous thrombosis, muscle atrophy and dysphagia.  Anticipated outcomes of the procedure as well as he risks and benefits of the alternatives to the procedure, and the roles and tasks of the personnel to be involved, were discussed with the patient, and the patient consents to the procedure and agrees to proceed. A copy of the patient medication guide was given to the patient which explains the blackbox warning. ° °Patients identity and treatment sites confirmed Yes.  . ° °Details of Procedure: °Skin was cleaned with alcohol. Prior to injection, the needle plunger was aspirated to make sure the needle was not within a blood vessel.  There was no blood retrieved on aspiration.   ° °Following is a summary of the muscles injected  And the amount of Botulinum toxin used: ° °Dilution °200 units of Botox was reconstituted with 4 ml of preservative free normal saline. °Time of reconstitution: At the time of the office visit (<30 minutes prior to injection)  ° °Injections  °155 total units of Botox was injected with a 30 gauge needle. ° °Injection Sites: °L occipitalis: 15 units- 3 sites  °R occiptalis: 15 units- 3 sites ° °L upper trapezius: 15 units- 3 sites °R upper trapezius: 15 units- 3 sits          °L paraspinal: 10 units- 2 sites °R paraspinal: 10 units- 2 sites ° °Face °L frontalis(2 injection sites):10 units   °R frontalis(2 injection sites):10 units         °L corrugator: 5 units   °R corrugator: 5 units           °Procerus: 5 units   °L temporalis: 20 units °R temporalis: 20 units  ° °Agent:  °200 units of botulinum Type  A (Onobotulinum Toxin type A) was reconstituted with 4 ml of preservative free normal saline.  °Time of reconstitution: At the time of the office visit (<30 minutes prior to injection)  ° ° ° Total injected (Units):  155 ° Total wasted (Units):  45 ° °Patient tolerated procedure well without complications.   °Reinjection is anticipated in 3 months. ° ° °

## 2023-04-22 NOTE — Telephone Encounter (Signed)
PA approved.

## 2023-05-01 ENCOUNTER — Encounter: Payer: Self-pay | Admitting: Neurology

## 2023-05-02 ENCOUNTER — Other Ambulatory Visit: Payer: Self-pay | Admitting: Neurology

## 2023-05-02 MED ORDER — PREDNISONE 10 MG PO TABS: ORAL_TABLET | ORAL | 0 refills | Status: DC

## 2023-05-07 ENCOUNTER — Other Ambulatory Visit: Payer: Self-pay

## 2023-05-14 ENCOUNTER — Encounter: Payer: Self-pay | Admitting: Psychiatry

## 2023-05-20 ENCOUNTER — Ambulatory Visit
Admission: RE | Admit: 2023-05-20 | Discharge: 2023-05-20 | Disposition: A | Discharge status: Home / Self Care | Payer: 59 | Source: Ambulatory Visit | Attending: Neurology | Admitting: Neurology

## 2023-05-20 DIAGNOSIS — I671 Cerebral aneurysm, nonruptured: Secondary | ICD-10-CM

## 2023-05-20 MED ORDER — IOPAMIDOL (ISOVUE-370) INJECTION 76%
500.0000 mL | Freq: Once | INTRAVENOUS | Status: AC | PRN
  Administered 2023-05-20: 75 mL via INTRAVENOUS

## 2023-05-23 ENCOUNTER — Encounter: Payer: Self-pay | Admitting: Neurology

## 2023-05-23 ENCOUNTER — Other Ambulatory Visit: Payer: Self-pay | Admitting: Psychiatry

## 2023-05-23 DIAGNOSIS — R5382 Chronic fatigue, unspecified: Secondary | ICD-10-CM

## 2023-06-23 ENCOUNTER — Other Ambulatory Visit (HOSPITAL_COMMUNITY): Payer: Self-pay

## 2023-06-26 ENCOUNTER — Other Ambulatory Visit (HOSPITAL_COMMUNITY): Payer: Self-pay

## 2023-07-07 ENCOUNTER — Telehealth: Payer: Self-pay

## 2023-07-07 NOTE — Telephone Encounter (Signed)
 Prior Authorization renewal for Auvelity 45-105 mg submitted to [employer], pending response

## 2023-07-08 NOTE — Telephone Encounter (Signed)
 Prior Approval received effective through 07/06/2024 for Auvelity 45-105 mg with [employer].

## 2023-07-14 ENCOUNTER — Ambulatory Visit: Payer: 59 | Admitting: Family Medicine

## 2023-07-14 ENCOUNTER — Encounter: Payer: Self-pay | Admitting: Family Medicine

## 2023-07-14 VITALS — BP 120/80 | HR 75 | Temp 98.3°F | Wt 146.9 lb

## 2023-07-14 DIAGNOSIS — J0100 Acute maxillary sinusitis, unspecified: Secondary | ICD-10-CM

## 2023-07-14 DIAGNOSIS — R059 Cough, unspecified: Secondary | ICD-10-CM

## 2023-07-14 LAB — POC COVID19 BINAXNOW: SARS Coronavirus 2 Ag: NEGATIVE

## 2023-07-14 MED ORDER — AMOXICILLIN-POT CLAVULANATE 875-125 MG PO TABS: 1.0000 | ORAL_TABLET | Freq: Two times a day (BID) | ORAL | 0 refills | Status: DC

## 2023-07-14 NOTE — Progress Notes (Signed)
 Established Patient Office Visit  Subjective   Patient ID: Rebecca Holt, female    DOB: [birth year]  Age: 38 y.o. MRN: [id number]  Chief Complaint  Patient presents with   Generalized Body Aches        Cough   Nasal Congestion    HPI   Rebecca Holt is here for acute illness.  Started about 5 days ago with some body aches, nasal congestion, cough, facial pain.  She has chronic almost daily migraine headaches followed by neurology.  She is concerned regarding worsening sinusitis.  She has had sinusitis frequently past.  She has been having cough productive of thick green sputum and also some similar nasal discharge.  Increased malaise.  Low-grade fever.  No known sick contacts.  She is currently taking baclofen  and Botox  for treatment of her chronic migraines per neurology.  She has poorly defined rheumatologic syndrome and is currently reportedly on AZT per rheumatology She has been on multiple prophylactic migraine medicines without success including at least a couple of calcitonin gene related peptide medications  Past Medical History:  Diagnosis Date   Anxiety    Arthritis    si joints   Bipolar disorder (HCC)    Chicken pox    as child   Depressive disorder 02/08/2019   History of COVID-19 09/09/2020   g i issues x 2-3 weeks all symptoms reolved   History of hypothyroidism    yrs ago per pt on 04-17-2021   Pneumonia    yrs ago per pt on 04-17-2021   Wears glasses    Past Surgical History:  Procedure Laterality Date   ANTERIOR AND POSTERIOR REPAIR N/A 04/23/2021   Procedure: POSTERIOR REPAIR (RECTOCELE);  Surgeon: Estelle Service, MD;  Location: [facility];  Service: Gynecology;  Laterality: N/A;   BIOPSY  07/25/2021   Procedure: BIOPSY;  Surgeon: Saintclair Jasper, MD;  Location: [facility];  Service: Gastroenterology;;   COLONOSCOPY WITH PROPOFOL  N/A 07/25/2021   Procedure: COLONOSCOPY WITH PROPOFOL ;  Surgeon: Saintclair Jasper, MD;  Location: [facility];   Service: Gastroenterology;  Laterality: N/A;   CYSTOSCOPY N/A 04/23/2021   Procedure: CYSTOSCOPY;  Surgeon: Estelle Service, MD;  Location: [facility];  Service: Gynecology;  Laterality: N/A;   ESOPHAGOGASTRODUODENOSCOPY (EGD) WITH PROPOFOL  N/A 07/25/2021   Procedure: ESOPHAGOGASTRODUODENOSCOPY (EGD) WITH PROPOFOL ;  Surgeon: Saintclair Jasper, MD;  Location: [facility];  Service: Gastroenterology;  Laterality: N/A;   LAPAROSCOPIC VAGINAL HYSTERECTOMY WITH SALPINGECTOMY Bilateral 04/23/2021   Procedure: LAPAROSCOPIC ASSISTED VAGINAL HYSTERECTOMY WITH BILATERAL  SALPINGECTOMY;  Surgeon: Estelle Service, MD;  Location: [facility];  Service: Gynecology;  Laterality: Bilateral;   TONSILLECTOMY Bilateral 2020   TRANSFORAMINAL LUMBAR INTERBODY FUSION (TLIF) WITH PEDICLE SCREW FIXATION 1 LEVEL N/A 05/07/2019   Procedure: Lumbar Five- Sacral One Transforaminal lumbar interbody Fusion;  Surgeon: Unice Pac, MD;  Location[facility];  Service: Neurosurgery;  Laterality: N/A;  Lumbar 5 Sacral 1 Transforaminal lumbar interbody fusion    reports that she has never smoked. She has never used smokeless tobacco. She reports that she does not drink alcohol and does not use drugs. family history includes Bipolar disorder in her mother and son; Cancer in her maternal grandfather and paternal grandfather; Diabetes in her father and paternal grandfather; Diabetic kidney disease in her father; Hyperlipidemia in her father; Mental illness in her mother; Thyroid cancer in her maternal grandmother. Allergies  Allergen Reactions   Tramadol Itching   Latex Other (See Comments)    Causes irritation.  Review of Systems  Constitutional:  Positive for fever and malaise/fatigue.  HENT:  Positive for congestion and sinus pain. Negative for ear pain.   Respiratory:  Positive for cough.   Neurological:  Positive for headaches.      Objective:     BP 120/80 (BP Location: Left Arm, Patient  Position: Sitting, Cuff Size: Normal)   Pulse 75   Temp 98.3 F (36.8 C) (Oral)   Wt 146 lb 14.4 oz (66.6 kg)   LMP 04/23/2021 (Exact Date)   SpO2 99%   BMI 23.01 kg/m  BP Readings from Last 3 Encounters:  07/14/23 120/80  04/11/23 118/78  04/01/23 117/81   Wt Readings from Last 3 Encounters:  07/14/23 146 lb 14.4 oz (66.6 kg)  04/11/23 142 lb 12.8 oz (64.8 kg)  04/01/23 141 lb 3.2 oz (64 kg)      Physical Exam Vitals reviewed.  Constitutional:      General: She is not in acute distress.    Appearance: Normal appearance. She is not toxic-appearing.  HENT:     Right Ear: Tympanic membrane normal.     Left Ear: Tympanic membrane normal.     Mouth/Throat:     Mouth: Mucous membranes are moist.     Pharynx: Oropharynx is clear. No oropharyngeal exudate or posterior oropharyngeal erythema.  Cardiovascular:     Rate and Rhythm: Normal rate and regular rhythm.  Pulmonary:     Effort: Pulmonary effort is normal.     Breath sounds: Normal breath sounds. No wheezing or rales.  Musculoskeletal:     Cervical back: Neck supple.  Neurological:     Mental Status: She is alert.      Results for orders placed or performed in visit on 07/14/23  POC COVID-19  Result Value Ref Range   SARS Coronavirus 2 Ag Negative Negative      The ASCVD Risk score (Arnett DK, et al., 2019) failed to calculate for the following reasons:   The 2019 ASCVD risk score is only valid for ages 22 to 69    Assessment & Plan:   Patient presents with about 5-day history of sinus congestive symptoms and cough.  COVID screen negative.  We discussed the fact that most upper respiratory infections are viral.  She has had some progressive right maxillary pain and pressure and is concerned about risk of evolving sinusitis.  We agreed to prescription of Augmentin  875 mg twice daily for 10 days to start for any worsening symptoms.  Plenty fluids and rest.  Follow-up for any persistent or worsening  symptoms   No follow-ups on file.    Wolm Scarlet, MD

## 2023-07-15 ENCOUNTER — Other Ambulatory Visit: Payer: Self-pay

## 2023-07-15 NOTE — Progress Notes (Signed)
 Specialty Pharmacy Refill Coordination Note   Rebecca Holt is a 38 y.o. female contacted today regarding refills of specialty medication(s) OnabotulinumtoxinA (BOTOX)     Patient requested Courier to Provider Office   Delivery date: 07/21/23   Verified address: [address], [place], [place], [address]     Medication will be filled on 01.17.25.

## 2023-07-18 ENCOUNTER — Telehealth: Payer: Self-pay

## 2023-07-18 ENCOUNTER — Other Ambulatory Visit: Payer: Self-pay

## 2023-07-18 NOTE — Telephone Encounter (Signed)
Specialty Pharmacy Refill Coordination Note   Rebecca Holt is a 38 y.o. female contacted today regarding refills of specialty medication(s) OnabotulinumtoxinA (BOTOX)     Patient requested Courier to Provider Office   Delivery date: 07/21/23   Verified address: [address], [place], [place], [address]     Medication will be filled on 01.17.25.

## 2023-07-23 ENCOUNTER — Ambulatory Visit (INDEPENDENT_AMBULATORY_CARE_PROVIDER_SITE_OTHER): Payer: 59 | Admitting: Psychiatry

## 2023-07-25 ENCOUNTER — Ambulatory Visit: Payer: 59 | Admitting: Neurology

## 2023-07-25 DIAGNOSIS — G43009 Migraine without aura, not intractable, without status migrainosus: Secondary | ICD-10-CM

## 2023-07-25 MED ORDER — ONABOTULINUMTOXINA 100 UNITS IJ SOLR
200.0000 [IU] | Freq: Once | INTRAMUSCULAR | Status: AC
Start: 2023-07-25 — End: 2023-07-25
  Administered 2023-07-25: 155 [IU] via INTRAMUSCULAR

## 2023-07-25 NOTE — Progress Notes (Signed)

## 2023-07-28 ENCOUNTER — Other Ambulatory Visit: Payer: Self-pay | Admitting: Neurology

## 2023-07-28 ENCOUNTER — Encounter: Payer: Self-pay | Admitting: Neurology

## 2023-07-28 MED ORDER — PREDNISONE 10 MG PO TABS: ORAL_TABLET | ORAL | 0 refills | Status: DC

## 2023-08-25 ENCOUNTER — Ambulatory Visit: Payer: 59 | Admitting: Family Medicine

## 2023-08-25 ENCOUNTER — Encounter: Payer: Self-pay | Admitting: Family Medicine

## 2023-08-25 VITALS — BP 118/78 | HR 92 | Temp 99.7°F | Ht 67.0 in | Wt 148.4 lb

## 2023-08-25 DIAGNOSIS — J029 Acute pharyngitis, unspecified: Secondary | ICD-10-CM

## 2023-08-25 DIAGNOSIS — R059 Cough, unspecified: Secondary | ICD-10-CM

## 2023-08-25 DIAGNOSIS — J101 Influenza due to other identified influenza virus with other respiratory manifestations: Secondary | ICD-10-CM

## 2023-08-25 LAB — POCT RAPID STREP A (OFFICE): Rapid Strep A Screen: NEGATIVE

## 2023-08-25 LAB — POCT INFLUENZA A/B
Influenza A, POC: POSITIVE — AB
Influenza B, POC: NEGATIVE

## 2023-08-25 LAB — POC COVID19 BINAXNOW: SARS Coronavirus 2 Ag: NEGATIVE

## 2023-08-25 MED ORDER — OSELTAMIVIR PHOSPHATE 75 MG PO CAPS: 75.0000 mg | ORAL_CAPSULE | Freq: Two times a day (BID) | ORAL | 0 refills | Status: AC

## 2023-08-25 MED ORDER — ALBUTEROL SULFATE HFA 108 (90 BASE) MCG/ACT IN AERS: 2.0000 | INHALATION_SPRAY | Freq: Four times a day (QID) | RESPIRATORY_TRACT | 0 refills | Status: DC | PRN

## 2023-08-25 NOTE — Patient Instructions (Addendum)
-  POSITIVE for Influenza A. Provided information about influenza.  -Negative for rapid strep and covid. -Prescribed Tamiflu 75mg  tablet, take 1 tablet every 12 hours for 5 days for influenza. Also, prescribed Albuterol Inhaler, 2 puffs every 6 hours as needed for wheezing, shortness of breath, and cough as needed. -Recommend supportative care.  -Rest, hydrate.  -Alternate Tylenol 1000mg  and Ibuprofen 600-800mg  every 4 hours for pain, headache, and body aches. Recommend to eat something when taking Ibuprofen.   -May continue to take Mucinex.  -If symptoms do not improve by the end of this week, please follow up.

## 2023-08-25 NOTE — Progress Notes (Signed)
 Acute Office Visit   Subjective:  Patient ID: Rebecca Holt, female    DOB: Nov 05, 1985, 38 y.o.   MRN: [id number]  Chief Complaint  Patient presents with   Cough    Pt reports cough started last Thursday. Pt states she is having slight sore throat, congestion, body aches, SOB when moving around, feeling tired, headache. Taken aleve, ibuprofen, mucinex. Pt reports she is coughing up yellow clear phlegm.     Cough   Patient is present for an acute visit. Patient is experiencing sore throat, body aches, SHOB when moving around, productive cough-alternating between clear to yellow, thick phlegm, nasal congestion, nasal drainage, fever, headache,   Denies ear pain or chest pain.   Symptoms started on Thursday.   She has took Ibuprofen, Aleve, Mucinex, and Robitussin. She thinks the Mucinex has helped some.   Children recently been sick for 1-2 days.  Review of Systems  Respiratory:  Positive for cough.    See HPI above      Objective:   BP 118/78 (BP Location: Left Arm, Patient Position: Sitting, Cuff Size: Normal)   Pulse 92   Temp 99.7 F (37.6 C) (Oral)   Ht 5\' 7"  (1.702 m)   Wt 148 lb 6.4 oz (67.3 kg)   LMP 04/23/2021 (Exact Date)   SpO2 98%   BMI 23.24 kg/m    Physical Exam Vitals reviewed.  Constitutional:      General: She is not in acute distress.    Appearance: Normal appearance. She is normal weight. She is ill-appearing. She is not toxic-appearing or diaphoretic.  HENT:     Head: Normocephalic and atraumatic.     Right Ear: Tympanic membrane, ear canal and external ear normal. There is no impacted cerumen.     Left Ear: Tympanic membrane, ear canal and external ear normal. There is no impacted cerumen.     Nose: Congestion present.     Right Sinus: No maxillary sinus tenderness or frontal sinus tenderness.     Left Sinus: No maxillary sinus tenderness or frontal sinus tenderness.  Eyes:     General:        Right eye: No discharge.        Left eye: No  discharge.     Conjunctiva/sclera: Conjunctivae normal.  Cardiovascular:     Rate and Rhythm: Normal rate and regular rhythm.     Heart sounds: Normal heart sounds. No murmur heard.    No friction rub. No gallop.  Pulmonary:     Effort: Pulmonary effort is normal. No respiratory distress.     Breath sounds: Normal breath sounds.  Musculoskeletal:        General: Normal range of motion.  Skin:    General: Skin is warm and dry.  Neurological:     General: No focal deficit present.     Mental Status: She is alert and oriented to person, place, and time. Mental status is at baseline.  Psychiatric:        Mood and Affect: Mood normal.        Behavior: Behavior normal.        Thought Content: Thought content normal.        Judgment: Judgment normal.     Latest Reference Range & Units 08/25/23 15:54  Rapid Strep A Screen Negative  Negative  Influenza A, POC Negative  Positive !  Influenza B, POC Negative  Negative  !: Data is abnormal     Assessment & Plan:  Cough,  unspecified type -     POC COVID-19 BinaxNow -     POCT Influenza A/B -     POCT rapid strep A -     Albuterol Sulfate HFA; Inhale 2 puffs into the lungs every 6 (six) hours as needed for wheezing or shortness of breath (For cough).  Dispense: 8 g; Refill: 0  Sore throat -     POC COVID-19 BinaxNow -     POCT Influenza A/B -     POCT rapid strep A  Influenza A -     Oseltamivir Phosphate; Take 1 capsule (75 mg total) by mouth 2 (two) times daily for 5 days.  Dispense: 10 capsule; Refill: 0 -     Albuterol Sulfate HFA; Inhale 2 puffs into the lungs every 6 (six) hours as needed for wheezing or shortness of breath (For cough).  Dispense: 8 g; Refill: 0  -POSITIVE for Influenza A. Provided information about influenza.  -Negative for rapid strep and covid. -Prescribed Tamiflu 75mg  tablet, take 1 tablet every 12 hours for 5 days for influenza. Also, prescribed Albuterol Inhaler, 2 puffs every 6 hours as needed for  wheezing, shortness of breath, and cough as needed. -Recommend supportative care.  -Rest, hydrate.  -Alternate Tylenol 1000mg  and Ibuprofen 600-800mg  every 4 hours for pain, headache, and body aches. Recommend to eat something when taking Ibuprofen.   -May continue to take Mucinex.  -If symptoms do not improve by the end of this week, please follow up.   Zandra Abts, NP

## 2023-09-16 ENCOUNTER — Telehealth: Payer: Self-pay | Admitting: Pharmacy Technician

## 2023-09-16 ENCOUNTER — Other Ambulatory Visit (HOSPITAL_COMMUNITY): Payer: Self-pay

## 2023-09-16 NOTE — Telephone Encounter (Signed)
 Pharmacy Benefit PA has been submitted for Botox via CoverMyMeds.  INSURANCE: [facility] [address]/[facility]/FAX: [id number] Procedure code 47829  does not require a PA Status is Submitted

## 2023-09-16 NOTE — Telephone Encounter (Signed)
 Pharmacy Patient Advocate Encounter   B[employer] verification has been Submitted Benefit Verification #:   [id number]   Dx Code: G43.009 J-code: W0981 Procedure code: 19147

## 2023-09-17 ENCOUNTER — Other Ambulatory Visit (HOSPITAL_COMMUNITY): Payer: Self-pay

## 2023-09-17 NOTE — Telephone Encounter (Signed)
 Pharmacy Patient Advocate Encounter  Received notification from [facility] that Prior Authorization for BOTOX 200 has been APPROVED from 3.18.25 to 3.18.26. Ran test claim, Copay is $200. This test claim was processed through [facility]- copay amounts may vary at other pharmacies due to pharmacy/plan contracts, or as the patient moves through the different stages of their insurance plan.   PA #/Case ID/Reference #: [id number]

## 2023-10-01 ENCOUNTER — Other Ambulatory Visit (HOSPITAL_COMMUNITY): Payer: Self-pay

## 2023-10-06 ENCOUNTER — Ambulatory Visit (INDEPENDENT_AMBULATORY_CARE_PROVIDER_SITE_OTHER): Admitting: Family Medicine

## 2023-10-06 ENCOUNTER — Other Ambulatory Visit: Payer: Self-pay

## 2023-10-06 VITALS — BP 116/70 | HR 92 | Temp 98.7°F | Wt 151.2 lb

## 2023-10-06 DIAGNOSIS — J0100 Acute maxillary sinusitis, unspecified: Secondary | ICD-10-CM

## 2023-10-06 MED ORDER — FLUCONAZOLE 150 MG PO TABS: 150.0000 mg | ORAL_TABLET | Freq: Once | ORAL | 0 refills | Status: AC

## 2023-10-06 MED ORDER — AMOXICILLIN-POT CLAVULANATE 875-125 MG PO TABS: 1.0000 | ORAL_TABLET | Freq: Two times a day (BID) | ORAL | 0 refills | Status: DC

## 2023-10-06 NOTE — Progress Notes (Signed)
 Specialty Pharmacy Refill Coordination Note  Rebecca Holt is a 38 y.o. female contacted today regarding refills of specialty medication(s) OnabotulinumtoxinA (BOTOX)   Patient requested Courier to Provider Office   Delivery date: 10/16/23   Verified address: [address], , [place], [address]   Medication will be filled on 04.16.25.

## 2023-10-06 NOTE — Progress Notes (Signed)
 Established Patient Office Visit  Subjective   Patient ID: Rebecca Holt, female    DOB: 04-Feb-1986  Age: 38 y.o. MRN: [id number]  Chief Complaint  Patient presents with   Sinusitis    Patient complains of sinusitis, x1 week, Patient reports greenish discharge , Tried Mucinex   Cough    Patient complains of productive cough, x1 week, Reports greenish sputum    Hoarse    Patient complains of hoarseness, x1 week     HPI   Rebecca Holt is seen as a work and with possible acute sinusitis.  She has history of chronic migraine headaches.  She states about a week ago she developed some bodyaches and fatigue followed by increased nasal congestion hoarseness and now productive cough.  She is having increasing facial pain and pressure over her maxillary sinuses.  She has had some greenish discharge from her sinuses and cough.  She thinks she may have had some low-grade fever first couple days but none since then.  She states she has had tremendous difficulty shaking sinus infections in the past which is sometimes gone weeks.  Not aware of any wheezing.  No nausea or vomiting.  Past Medical History:  Diagnosis Date   Anxiety    Arthritis    si joints   Bipolar disorder (HCC)    Chicken pox    as child   Depressive disorder 02/08/2019   History of COVID-19 09/09/2020   g i issues x 2-3 weeks all symptoms reolved   History of hypothyroidism    yrs ago per pt on 04-17-2021   Pneumonia    yrs ago per pt on 04-17-2021   Wears glasses    Past Surgical History:  Procedure Laterality Date   ANTERIOR AND POSTERIOR REPAIR N/A 04/23/2021   Procedure: POSTERIOR REPAIR (RECTOCELE);  Surgeon: Huel Cote, MD;  Location: [facility];  Service: Gynecology;  Laterality: N/A;   BIOPSY  07/25/2021   Procedure: BIOPSY;  Surgeon: Kerin Salen, MD;  Location: [facility];  Service: Gastroenterology;;   COLONOSCOPY WITH PROPOFOL N/A 07/25/2021   Procedure: COLONOSCOPY WITH PROPOFOL;  Surgeon:  Kerin Salen, MD;  Location: [facility];  Service: Gastroenterology;  Laterality: N/A;   CYSTOSCOPY N/A 04/23/2021   Procedure: CYSTOSCOPY;  Surgeon: Huel Cote, MD;  Location: [facility];  Service: Gynecology;  Laterality: N/A;   ESOPHAGOGASTRODUODENOSCOPY (EGD) WITH PROPOFOL N/A 07/25/2021   Procedure: ESOPHAGOGASTRODUODENOSCOPY (EGD) WITH PROPOFOL;  Surgeon: Kerin Salen, MD;  Location: [facility];  Service: Gastroenterology;  Laterality: N/A;   LAPAROSCOPIC VAGINAL HYSTERECTOMY WITH SALPINGECTOMY Bilateral 04/23/2021   Procedure: LAPAROSCOPIC ASSISTED VAGINAL HYSTERECTOMY WITH BILATERAL  SALPINGECTOMY;  Surgeon: Huel Cote, MD;  Location: [facility];  Service: Gynecology;  Laterality: Bilateral;   TONSILLECTOMY Bilateral 2020   TRANSFORAMINAL LUMBAR INTERBODY FUSION (TLIF) WITH PEDICLE SCREW FIXATION 1 LEVEL N/A 05/07/2019   Procedure: Lumbar Five- Sacral One Transforaminal lumbar interbody Fusion;  Surgeon: Maeola Harman, MD;  Location: [facility] OR;  Service: Neurosurgery;  Laterality: N/A;  Lumbar 5 Sacral 1 Transforaminal lumbar interbody fusion    reports that she has never smoked. She has never used smokeless tobacco. She reports that she does not drink alcohol and does not use drugs. family history includes Bipolar disorder in her mother and son; Cancer in her maternal grandfather and paternal grandfather; Diabetes in her father and paternal grandfather; Diabetic kidney disease in her father; Hyperlipidemia in her father; Mental illness in her mother; Thyroid cancer in her maternal grandmother.  Allergies  Allergen Reactions   Tramadol Itching   Latex Other (See Comments)    Causes irritation.    Review of Systems  Constitutional:  Positive for malaise/fatigue. Negative for chills and fever.  HENT:  Positive for congestion and sinus pain.   Respiratory:  Positive for cough and sputum production. Negative for hemoptysis, shortness of breath and  wheezing.   Cardiovascular:  Negative for chest pain.  Neurological:  Positive for headaches.      Objective:     BP 116/70 (BP Location: Left Arm, Patient Position: Sitting, Cuff Size: Normal)   Pulse 92   Temp 98.7 F (37.1 C) (Oral)   Wt 151 lb 3.2 oz (68.6 kg)   LMP 04/23/2021 (Exact Date)   SpO2 98%   BMI 23.68 kg/m  BP Readings from Last 3 Encounters:  10/06/23 116/70  08/25/23 118/78  07/14/23 120/80   Wt Readings from Last 3 Encounters:  10/06/23 151 lb 3.2 oz (68.6 kg)  08/25/23 148 lb 6.4 oz (67.3 kg)  07/14/23 146 lb 14.4 oz (66.6 kg)      Physical Exam Vitals reviewed.  Constitutional:      General: She is not in acute distress.    Appearance: She is not ill-appearing.  HENT:     Right Ear: Tympanic membrane normal.     Left Ear: Tympanic membrane normal.     Mouth/Throat:     Pharynx: Oropharynx is clear. No oropharyngeal exudate or posterior oropharyngeal erythema.  Cardiovascular:     Rate and Rhythm: Normal rate and regular rhythm.  Pulmonary:     Effort: Pulmonary effort is normal.     Breath sounds: Normal breath sounds. No wheezing or rales.  Musculoskeletal:     Cervical back: Neck supple.  Neurological:     Mental Status: She is alert.      No results found for any visits on 10/06/23.    The ASCVD Risk score (Arnett DK, et al., 2019) failed to calculate for the following reasons:   The 2019 ASCVD risk score is only valid for ages 43 to 69    Assessment & Plan:   Acute sinusitis involving maxillary sinuses bilaterally.  Discussed importance of good hydration.  Consider over-the-counter Mucinex.  Start Augmentin 875 mg twice daily for 10 days.  Follow-up for any persistent or worsening symptoms  Evelena Peat, MD

## 2023-10-13 ENCOUNTER — Other Ambulatory Visit: Payer: Self-pay

## 2023-10-15 ENCOUNTER — Other Ambulatory Visit: Payer: Self-pay

## 2023-10-24 ENCOUNTER — Ambulatory Visit: Payer: 59 | Admitting: Neurology

## 2023-10-24 DIAGNOSIS — G43009 Migraine without aura, not intractable, without status migrainosus: Secondary | ICD-10-CM

## 2023-10-24 MED ORDER — ONABOTULINUMTOXINA 100 UNITS IJ SOLR
200.0000 [IU] | Freq: Once | INTRAMUSCULAR | Status: AC
  Administered 2023-10-24: 155 [IU] via INTRAMUSCULAR

## 2023-10-24 NOTE — Progress Notes (Signed)

## 2023-11-13 ENCOUNTER — Other Ambulatory Visit: Payer: Self-pay | Admitting: Neurology
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
## 2023-11-26 ENCOUNTER — Encounter: Payer: Self-pay | Admitting: Neurology

## 2023-11-26 ENCOUNTER — Ambulatory Visit: Payer: 59 | Admitting: Neurology

## 2023-11-26 ENCOUNTER — Telehealth: Payer: Self-pay

## 2023-11-26 VITALS — BP 111/72 | HR 80 | Ht 67.0 in | Wt 155.0 lb

## 2023-11-26 DIAGNOSIS — G43E11 Chronic migraine with aura, intractable, with status migrainosus: Secondary | ICD-10-CM | POA: Insufficient documentation

## 2023-11-26 MED ORDER — BACLOFEN 10 MG PO TABS: 10.0000 mg | ORAL_TABLET | Freq: Three times a day (TID) | ORAL | 5 refills | Status: AC | PRN

## 2023-11-26 NOTE — Patient Instructions (Addendum)
 Stop Botox .  Plan to start Vyepti 300mg  IV every 3 months Rizatriptan  as needed for migraine attacks, Zofran  for nausea Baclofen  for headache and neck tension Limit use of pain relievers to no more than 9 days out of the month to prevent risk of rebound or medication-overuse headache. Keep headache diary Follow up 7 months.

## 2023-11-26 NOTE — Telephone Encounter (Signed)
 Patient seen in office today. Per Dr.Jaffe patient to stop Botox  and start vyepti.   PA team cancel Botox  PA. Will let Infusion center know to start PA for Vyepti.

## 2023-11-27 ENCOUNTER — Telehealth: Payer: Self-pay

## 2023-11-27 NOTE — Telephone Encounter (Signed)
 Dr. Festus Hubert and Carron Clap, patient will be scheduled as soon as possible.  Auth Submission: APPROVED Site of care: Site of care: [facility] Payer: [employer] commercial Medication & CPT/J Code(s) submitted: Vyepti (Eptinezumab) [id number] Route of submission (phone, fax, portal): portal Phone # Fax # Auth type: Buy/Bill PB Units/visits requested: 300mg  2 doses Reference number: [id number] Approval from: 11/27/23 to 11/26/24

## 2023-12-09 ENCOUNTER — Ambulatory Visit

## 2023-12-09 VITALS — BP 105/69 | HR 59 | Temp 98.6°F | Resp 16 | Ht 67.0 in | Wt 154.2 lb

## 2023-12-09 DIAGNOSIS — G43E11 Chronic migraine with aura, intractable, with status migrainosus: Secondary | ICD-10-CM | POA: Diagnosis not present

## 2023-12-09 MED ORDER — SODIUM CHLORIDE 0.9 % IV SOLN
300.0000 mg | Freq: Once | INTRAVENOUS | Status: AC
  Administered 2023-12-09: 300 mg via INTRAVENOUS
  Filled 2023-12-09: qty 3

## 2023-12-09 NOTE — Progress Notes (Signed)
 Diagnosis: Intractable chronic migraine with aura with status migrainosus   Provider:  Praveen Mannam MD  Procedure: IV Infusion  IV Type: Peripheral, IV Location: R Antecubital  Vyepti (Eptinezumab-jjmr), Dose: 300 mg  Infusion Start Time: 1009  Infusion Stop Time: 1040  Post Infusion IV Care: Observation period completed and Peripheral IV Discontinued  Discharge: Condition: Good, Destination: Home . AVS Declined  Performed by:  Lauran Pollard, LPN

## 2024-01-01 ENCOUNTER — Other Ambulatory Visit: Payer: Self-pay

## 2024-01-01 NOTE — Progress Notes (Signed)
 Patient now on Vyepti  via Buy/Bill, dis-enrolling patient.

## 2024-01-11 ENCOUNTER — Encounter: Payer: Self-pay | Admitting: Neurology

## 2024-01-12 ENCOUNTER — Other Ambulatory Visit: Payer: Self-pay | Admitting: Neurology

## 2024-01-12 MED ORDER — PREDNISONE 10 MG PO TABS
ORAL_TABLET | ORAL | 0 refills | Status: DC
Start: 2024-01-12 — End: 2024-03-12

## 2024-01-13 ENCOUNTER — Other Ambulatory Visit: Payer: Self-pay

## 2024-01-21 ENCOUNTER — Ambulatory Visit (INDEPENDENT_AMBULATORY_CARE_PROVIDER_SITE_OTHER)

## 2024-01-21 DIAGNOSIS — G43009 Migraine without aura, not intractable, without status migrainosus: Secondary | ICD-10-CM | POA: Diagnosis not present

## 2024-01-21 MED ORDER — DIPHENHYDRAMINE HCL 50 MG/ML IJ SOLN
50.0000 mg | Freq: Once | INTRAMUSCULAR | Status: AC
  Administered 2024-01-21: 25 mg via INTRAMUSCULAR

## 2024-01-21 MED ORDER — METOCLOPRAMIDE HCL 5 MG/ML IJ SOLN
10.0000 mg | Freq: Once | INTRAMUSCULAR | Status: AC
  Administered 2024-01-21: 10 mg via INTRAMUSCULAR

## 2024-01-21 MED ORDER — KETOROLAC TROMETHAMINE 60 MG/2ML IM SOLN
60.0000 mg | Freq: Once | INTRAMUSCULAR | Status: AC
  Administered 2024-01-21: 60 mg via INTRAMUSCULAR

## 2024-01-23 ENCOUNTER — Ambulatory Visit: Admitting: Neurology

## 2024-03-10 ENCOUNTER — Ambulatory Visit (INDEPENDENT_AMBULATORY_CARE_PROVIDER_SITE_OTHER)

## 2024-03-10 VITALS — BP 110/73 | HR 63 | Temp 98.8°F | Resp 18 | Ht 67.0 in | Wt 158.0 lb

## 2024-03-10 DIAGNOSIS — G43E11 Chronic migraine with aura, intractable, with status migrainosus: Secondary | ICD-10-CM | POA: Diagnosis not present

## 2024-03-10 MED ORDER — SODIUM CHLORIDE 0.9 % IV SOLN
300.0000 mg | Freq: Once | INTRAVENOUS | Status: AC
  Administered 2024-03-10: 300 mg via INTRAVENOUS
  Filled 2024-03-10: qty 3

## 2024-03-10 NOTE — Progress Notes (Signed)
 Diagnosis: Intractable chronic migraine with aura with status migrainosus   Provider:  Praveen Mannam MD  Procedure: IV Infusion  IV Type: Peripheral, IV Location: L Forearm  Vyepti  (Eptinezumab -jjmr), Dose: 300 mg  Infusion Start Time: 1019  Infusion Stop Time: 1050  Post Infusion IV Care: Peripheral IV Discontinued  Discharge: Condition: Good, Destination: Home . AVS Declined  Performed by:  Opha Mcghee, RN

## 2024-03-12 ENCOUNTER — Encounter: Payer: Self-pay | Admitting: Neurology

## 2024-03-12 ENCOUNTER — Other Ambulatory Visit: Payer: Self-pay

## 2024-03-12 DIAGNOSIS — G43009 Migraine without aura, not intractable, without status migrainosus: Secondary | ICD-10-CM

## 2024-03-12 MED ORDER — PREDNISONE 10 MG PO TABS: ORAL_TABLET | ORAL | 0 refills | Status: DC

## 2024-06-08 ENCOUNTER — Other Ambulatory Visit: Payer: Self-pay | Admitting: Neurology

## 2024-06-08 ENCOUNTER — Encounter: Payer: Self-pay | Admitting: Neurology

## 2024-06-08 MED ORDER — PROPRANOLOL HCL ER 60 MG PO CP24: 60.0000 mg | ORAL_CAPSULE | Freq: Every day | ORAL | 5 refills | Status: DC

## 2024-06-09 ENCOUNTER — Ambulatory Visit
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
## 2024-06-28 ENCOUNTER — Encounter: Payer: Self-pay | Admitting: Neurology

## 2024-06-28 ENCOUNTER — Ambulatory Visit (INDEPENDENT_AMBULATORY_CARE_PROVIDER_SITE_OTHER): Admitting: Neurology

## 2024-06-28 VITALS — BP 110/72 | HR 63 | Ht 67.0 in | Wt 164.6 lb

## 2024-06-28 DIAGNOSIS — I671 Cerebral aneurysm, nonruptured: Secondary | ICD-10-CM

## 2024-06-28 DIAGNOSIS — G43E11 Chronic migraine with aura, intractable, with status migrainosus: Secondary | ICD-10-CM

## 2024-06-28 DIAGNOSIS — G932 Benign intracranial hypertension: Secondary | ICD-10-CM

## 2024-06-28 DIAGNOSIS — M5481 Occipital neuralgia: Secondary | ICD-10-CM | POA: Diagnosis not present

## 2024-06-28 DIAGNOSIS — M542 Cervicalgia: Secondary | ICD-10-CM | POA: Diagnosis not present

## 2024-06-28 NOTE — Patient Instructions (Addendum)
 Check CTA head to follow up on aneurysm. We have sent a referral to [facility] for your CTA and they will call you directly to schedule your appointment. They are located at [address] [facility]. If you need to contact them directly please call [phone].  After CT, will schedule for a spinal tap - check opening pressure as well as cell count, protein, glucose, gram stain and culture, cytology Refer to Dr. Emeline of physical medicine and rehab for evaluation and treatment of neck pain and occipital neuralgia Further recommendations pending results. Discontinue propranolol .   Follow up 4 months

## 2024-07-14 ENCOUNTER — Other Ambulatory Visit

## 2024-07-14 DIAGNOSIS — I671 Cerebral aneurysm, nonruptured: Secondary | ICD-10-CM

## 2024-07-14 DIAGNOSIS — G932 Benign intracranial hypertension: Secondary | ICD-10-CM

## 2024-07-14 MED ORDER — IOPAMIDOL (ISOVUE-370) INJECTION 76%
75.0000 mL | Freq: Once | INTRAVENOUS | Status: AC | PRN
  Administered 2024-07-14: 75 mL via INTRAVENOUS

## 2024-07-19 ENCOUNTER — Ambulatory Visit: Payer: Self-pay | Admitting: Neurology

## 2024-07-19 ENCOUNTER — Encounter: Payer: Self-pay | Admitting: Neurology

## 2024-07-19 DIAGNOSIS — G932 Benign intracranial hypertension: Secondary | ICD-10-CM

## 2024-07-19 NOTE — Telephone Encounter (Signed)
-----   Message from Juliene Dunnings, DO sent at 07/19/2024 10:48 AM EST ----- CTA of head is stable.  The tiny bulge that may or may not be an aneurysm is unchanged and at very low risk of rupture.  Next step is the spinal tap to measure opening pressure

## 2024-07-19 NOTE — Telephone Encounter (Signed)
 Patient advised.  LP ordered.

## 2024-07-20 NOTE — Progress Notes (Signed)
 Prior authorization submitted on [employer]. APPROVED. Authorization #:[id number]. Approval dates: Jul 21, 2024 - Oct 19, 2024

## 2024-07-28 ENCOUNTER — Encounter (HOSPITAL_COMMUNITY): Payer: Self-pay

## 2024-07-28 ENCOUNTER — Inpatient Hospital Stay (HOSPITAL_COMMUNITY): Admission: RE | Admit: 2024-07-28 | Source: Ambulatory Visit

## 2024-08-04 ENCOUNTER — Ambulatory Visit (HOSPITAL_COMMUNITY)
Admission: RE | Admit: 2024-08-04 | Discharge: 2024-08-04 | Disposition: A | Source: Ambulatory Visit | Attending: Neurology

## 2024-08-04 DIAGNOSIS — G932 Benign intracranial hypertension: Secondary | ICD-10-CM

## 2024-08-04 LAB — CSF CELL COUNT WITH DIFFERENTIAL
RBC Count, CSF: 1 /mm3 — ABNORMAL HIGH
Tube #: 3
WBC, CSF: 1 /mm3 (ref 0–5)

## 2024-08-04 LAB — PROTEIN AND GLUCOSE, CSF
Glucose, CSF: 49 mg/dL (ref 40–70)
Total  Protein, CSF: 41 mg/dL (ref 15–45)

## 2024-08-04 MED ORDER — LIDOCAINE 1 % OPTIME INJ - NO CHARGE
5.0000 mL | Freq: Once | INTRAMUSCULAR | Status: AC
  Administered 2024-08-04: 5 mL via INTRADERMAL
  Filled 2024-08-04: qty 6

## 2024-08-04 MED ORDER — LIDOCAINE HCL (PF) 1 % IJ SOLN
5.0000 mL | Freq: Once | INTRAMUSCULAR | Status: AC
  Administered 2024-08-04: 3 mL via INTRADERMAL

## 2024-08-04 NOTE — Progress Notes (Signed)
 Pt and husband received discharge instructions, teachback performed. Lumbar puncture site is clean dry intact, no signs of bleeding. Pt escorted out via wheelchair to husbands vehicle.

## 2024-08-04 NOTE — Procedures (Signed)
 Technically successful L2-L3 lumbar puncture. Please see full dictation under the imaging tab in Epic.   Roth Ress, AGACNP-BC 08/04/2024, 1:17 PM

## 2024-08-05 ENCOUNTER — Ambulatory Visit: Payer: Self-pay | Admitting: Neurology

## 2024-08-05 LAB — CYTOLOGY - NON PAP

## 2024-08-06 LAB — CSF CULTURE W GRAM STAIN: Culture: NO GROWTH

## 2024-11-15 ENCOUNTER — Ambulatory Visit: Payer: Self-pay | Admitting: Neurology
